# Patient Record
Sex: Male | Born: 1945 | Race: White | Hispanic: No | Marital: Married | Smoking: Former smoker
Health system: Southern US, Community
[De-identification: ages and names within clinical notes are randomized; demographics above are authoritative.]

## PROBLEM LIST (undated history)

## (undated) DIAGNOSIS — I4891 Unspecified atrial fibrillation: Secondary | ICD-10-CM

## (undated) DIAGNOSIS — I10 Essential (primary) hypertension: Secondary | ICD-10-CM

## (undated) DIAGNOSIS — K5720 Diverticulitis of large intestine with perforation and abscess without bleeding: Secondary | ICD-10-CM

## (undated) DIAGNOSIS — K5732 Diverticulitis of large intestine without perforation or abscess without bleeding: Secondary | ICD-10-CM

## (undated) DIAGNOSIS — I2510 Atherosclerotic heart disease of native coronary artery without angina pectoris: Secondary | ICD-10-CM

## (undated) DIAGNOSIS — C44599 Other specified malignant neoplasm of skin of other part of trunk: Secondary | ICD-10-CM

## (undated) DIAGNOSIS — E7800 Pure hypercholesterolemia, unspecified: Secondary | ICD-10-CM

## (undated) DIAGNOSIS — C44509 Unspecified malignant neoplasm of skin of other part of trunk: Secondary | ICD-10-CM

## (undated) DIAGNOSIS — Z972 Presence of dental prosthetic device (complete) (partial): Secondary | ICD-10-CM

## (undated) HISTORY — DX: Pure hypercholesterolemia, unspecified: E78.00

## (undated) HISTORY — DX: Unspecified atrial fibrillation: I48.91

## (undated) HISTORY — DX: Unspecified malignant neoplasm of skin of other part of trunk: C44.509

## (undated) HISTORY — DX: Essential (primary) hypertension: I10

## (undated) HISTORY — DX: Other specified malignant neoplasm of skin of other part of trunk: C44.599

## (undated) HISTORY — DX: Diverticulitis of large intestine without perforation or abscess without bleeding: K57.32

## (undated) HISTORY — PX: KNEE ARTHROSCOPY W/ MENISCAL REPAIR: SHX1877

## (undated) HISTORY — DX: Atherosclerotic heart disease of native coronary artery without angina pectoris: I25.10

## (undated) HISTORY — DX: Diverticulitis of large intestine with perforation and abscess without bleeding: K57.20

## (undated) HISTORY — PX: OTHER SURGICAL HISTORY: SHX169

---

## 1997-09-29 HISTORY — PX: CORONARY ARTERY BYPASS GRAFT: SHX141

## 2010-07-11 ENCOUNTER — Ambulatory Visit: Payer: Self-pay | Admitting: Cardiology

## 2010-10-10 ENCOUNTER — Emergency Department: Payer: Self-pay | Admitting: Internal Medicine

## 2010-10-14 ENCOUNTER — Other Ambulatory Visit: Payer: Self-pay | Admitting: Specialist

## 2010-10-27 ENCOUNTER — Ambulatory Visit: Payer: Self-pay | Admitting: Specialist

## 2010-11-12 ENCOUNTER — Ambulatory Visit: Payer: Self-pay | Admitting: Specialist

## 2010-11-12 DIAGNOSIS — I10 Essential (primary) hypertension: Secondary | ICD-10-CM

## 2010-11-18 ENCOUNTER — Ambulatory Visit: Payer: Self-pay | Admitting: Specialist

## 2011-01-29 ENCOUNTER — Encounter: Payer: Self-pay | Admitting: Cardiology

## 2011-01-29 DIAGNOSIS — I4891 Unspecified atrial fibrillation: Secondary | ICD-10-CM | POA: Insufficient documentation

## 2011-01-29 DIAGNOSIS — E7800 Pure hypercholesterolemia, unspecified: Secondary | ICD-10-CM | POA: Insufficient documentation

## 2011-01-29 DIAGNOSIS — I2510 Atherosclerotic heart disease of native coronary artery without angina pectoris: Secondary | ICD-10-CM | POA: Insufficient documentation

## 2011-01-29 DIAGNOSIS — I10 Essential (primary) hypertension: Secondary | ICD-10-CM | POA: Insufficient documentation

## 2011-02-05 ENCOUNTER — Telehealth: Payer: Self-pay | Admitting: *Deleted

## 2011-02-05 ENCOUNTER — Ambulatory Visit (INDEPENDENT_AMBULATORY_CARE_PROVIDER_SITE_OTHER): Payer: BC Managed Care – PPO | Admitting: Cardiology

## 2011-02-05 ENCOUNTER — Encounter: Payer: Self-pay | Admitting: Cardiology

## 2011-02-05 VITALS — BP 124/88 | HR 54 | Ht 69.0 in | Wt 250.6 lb

## 2011-02-05 DIAGNOSIS — I10 Essential (primary) hypertension: Secondary | ICD-10-CM

## 2011-02-05 DIAGNOSIS — E7800 Pure hypercholesterolemia, unspecified: Secondary | ICD-10-CM

## 2011-02-05 DIAGNOSIS — I2510 Atherosclerotic heart disease of native coronary artery without angina pectoris: Secondary | ICD-10-CM

## 2011-02-05 LAB — HEPATIC FUNCTION PANEL
Albumin: 4 g/dL (ref 3.5–5.2)
Alkaline Phosphatase: 42 U/L (ref 39–117)
Bilirubin, Direct: 0.2 mg/dL (ref 0.0–0.3)
Total Protein: 6.6 g/dL (ref 6.0–8.3)

## 2011-02-05 LAB — BASIC METABOLIC PANEL
CO2: 29 mEq/L (ref 19–32)
Chloride: 107 mEq/L (ref 96–112)
Potassium: 4.4 mEq/L (ref 3.5–5.1)

## 2011-02-05 LAB — LIPID PANEL
LDL Cholesterol: 82 mg/dL (ref 0–99)
Total CHOL/HDL Ratio: 4

## 2011-02-05 NOTE — Assessment & Plan Note (Signed)
He remains asymptomatic. His last stress test in October of 2009 was normal. I recommended continued risk factor modification. We will consider a stress Cardiolite study this fall after he has recovered from his knee surgery.

## 2011-02-05 NOTE — Patient Instructions (Signed)
We will call you with the results of your lab work today.  We will see you for an office visit in 6 months.  Increase your activity and lose weight.

## 2011-02-05 NOTE — Telephone Encounter (Signed)
LM w/ lab results 

## 2011-02-05 NOTE — Assessment & Plan Note (Signed)
He has a low HDL cholesterol. He needs to remain on his Vytorin and he needs to lose a significant amount of weight.

## 2011-02-05 NOTE — Assessment & Plan Note (Signed)
Blood pressure control is adequate. We will continue with his current medical therapy. He needs to restrict his sodium intake.

## 2011-02-05 NOTE — Telephone Encounter (Signed)
Message copied by Murrell Redden on Wed Feb 05, 2011  4:27 PM ------      Message from: [place], [name]      Created: Wed Feb 05, 2011  1:43 PM       Labs look great except low HDL. Needs to lose weight.

## 2011-02-05 NOTE — Progress Notes (Signed)
   Ryan Mccall Date of Birth: 06-29-1946   History of Present Illness: Ryan Mccall is seen for 6 months followup. He states he is doing very well. Since his last visit here he had arthroscopic surgery on his right knee. He denies any chest pain or shortness of breath. He does take Mobic p.r.n. For arthritic pain. He admits he's been very sedentary.  Current Outpatient Prescriptions on File Prior to Visit  Medication Sig Dispense Refill  . aspirin 325 MG EC tablet Take 325 mg by mouth daily.        . COD LIVER OIL PO Take by mouth daily.        Marland Kitchen ezetimibe-simvastatin (VYTORIN) 10-10 MG per tablet Take 1 tablet by mouth every other day.        . fexofenadine (ALLEGRA) 180 MG tablet Take 180 mg by mouth daily.        . fish oil-omega-3 fatty acids 1000 MG capsule Take by mouth daily.        . meloxicam (MOBIC) 7.5 MG tablet Take 7.5 mg by mouth every other day.        . metoprolol (LOPRESSOR) 50 MG tablet Take 50 mg by mouth 2 (two) times daily.        . Multiple Vitamin (MULTIVITAMIN) tablet Take 1 tablet by mouth every other day.        Marland Kitchen DISCONTD: niacin (NIASPAN) 500 MG CR tablet Take 500 mg by mouth at bedtime.          No Known Allergies  Past Medical History  Diagnosis Date  . Coronary artery disease   . Hypertension   . Hypercholesterolemia   . Atrial fibrillation     Past Surgical History  Procedure Date  . Coronary artery bypass graft 1999  . Knee arthroscopy w/ meniscal repair     History  Smoking status  . Former Smoker  . Quit date: 09/29/1997  Smokeless tobacco  . Not on file    History  Alcohol Use No    Family History  Problem Relation Age of Onset  . Lung disease Father 67    Review of Systems: The review of systems is positive for he still walks with a cane. His return to activity has been slow.  All other systems were reviewed and are negative.  Physical Exam: BP 124/88  Pulse 54  Ht 5\' 9"  (1.753 m)  Wt 250 lb 9.6 oz (113.671 kg)  BMI 37.01  kg/m2 He is a morbidly obese white male in no acute distress. He has no JVD or bruits. Lungs are clear. Cardiac exam reveals a regular rate and rhythm without gallop or murmur. Abdomen is soft and nontender. He has no edema. His right knee has healed well. He has mild lower extremity edema. LABORATORY DATA:   Assessment / Plan:

## 2011-03-31 ENCOUNTER — Other Ambulatory Visit: Payer: Self-pay | Admitting: Cardiology

## 2011-03-31 MED ORDER — METOPROLOL TARTRATE 50 MG PO TABS: 50.0000 mg | ORAL_TABLET | Freq: Two times a day (BID) | ORAL | Status: DC

## 2011-03-31 NOTE — Telephone Encounter (Signed)
Med refill

## 2011-04-01 ENCOUNTER — Other Ambulatory Visit: Payer: Self-pay | Admitting: *Deleted

## 2011-04-01 MED ORDER — METOPROLOL TARTRATE 50 MG PO TABS: 50.0000 mg | ORAL_TABLET | Freq: Two times a day (BID) | ORAL | Status: DC

## 2011-04-01 NOTE — Telephone Encounter (Signed)
escribe medication per fax request  

## 2011-09-03 ENCOUNTER — Ambulatory Visit (INDEPENDENT_AMBULATORY_CARE_PROVIDER_SITE_OTHER): Payer: BC Managed Care – PPO | Admitting: Cardiology

## 2011-09-03 ENCOUNTER — Encounter: Payer: Self-pay | Admitting: Cardiology

## 2011-09-03 VITALS — BP 140/90 | HR 54 | Ht 68.0 in | Wt 252.8 lb

## 2011-09-03 DIAGNOSIS — I10 Essential (primary) hypertension: Secondary | ICD-10-CM

## 2011-09-03 DIAGNOSIS — E7800 Pure hypercholesterolemia, unspecified: Secondary | ICD-10-CM

## 2011-09-03 DIAGNOSIS — I2510 Atherosclerotic heart disease of native coronary artery without angina pectoris: Secondary | ICD-10-CM

## 2011-09-03 DIAGNOSIS — E785 Hyperlipidemia, unspecified: Secondary | ICD-10-CM

## 2011-09-03 NOTE — Patient Instructions (Signed)
Continue your current medications.  Work on increasing your aerobic activity and losing weight.  I will see you again in 6 months.

## 2011-09-03 NOTE — Progress Notes (Signed)
   Ryan Mccall Date of Birth: [birth year]   History of Present Illness: Ryan Mccall is seen for 6 months followup. He states he is doing very well. His knee is improving from his prior surgery but he is still somewhat limited. He is working 10-12 hours a day at his job. He denies any chest pain or shortness of breath. He's had no increase in edema or palpitations.  Current Outpatient Prescriptions on File Prior to Visit  Medication Sig Dispense Refill  . aspirin 325 MG EC tablet Take 325 mg by mouth daily.        . COD LIVER OIL PO Take by mouth daily.        Marland Kitchen ezetimibe-simvastatin (VYTORIN) 10-10 MG per tablet Take 1 tablet by mouth every other day.        . fexofenadine (ALLEGRA) 180 MG tablet Take 180 mg by mouth daily.        . fish oil-omega-3 fatty acids 1000 MG capsule Take by mouth daily.        . metoprolol (LOPRESSOR) 50 MG tablet Take 1 tablet (50 mg total) by mouth 2 (two) times daily.  180 tablet  3  . Multiple Vitamin (MULTIVITAMIN) tablet Take 1 tablet by mouth every other day.          Allergies  Allergen Reactions  . Shellfish Allergy     Past Medical History  Diagnosis Date  . Coronary artery disease   . Hypertension   . Hypercholesterolemia   . Atrial fibrillation     Past Surgical History  Procedure Date  . Coronary artery bypass graft 1999  . Knee arthroscopy w/ meniscal repair     History  Smoking status  . Former Smoker  . Quit date: 09/29/1997  Smokeless tobacco  . Not on file    History  Alcohol Use No    Family History  Problem Relation Age of Onset  . Lung disease Father 83    Review of Systems: As noted in history of present illness.  All other systems were reviewed and are negative.  Physical Exam: BP 140/90  Pulse 54  Ht 5\' 8"  (1.727 m)  Wt 114.669 kg (252 lb 12.8 oz)  BMI 38.44 kg/m2 He is a morbidly obese white male in no acute distress. He has no JVD or bruits. Lungs are clear. Cardiac exam reveals a regular rate and rhythm  without gallop or murmur. Abdomen is soft and nontender. He has no edema. His right knee has healed well. He has mild lower extremity edema. LABORATORY DATA: ECG today demonstrates sinus bradycardia with a rate of 49 beats per minute. It is otherwise normal. Blood work from August of 2012 showed a normal CBC. Chemistry panel was normal. Total cholesterol 134, triglycerides 83, HDL 44, LDL 73. Assessment / Plan:

## 2011-09-03 NOTE — Assessment & Plan Note (Signed)
Blood pressure is borderline today. He needs to work on increasing his aerobic activity and lose weight.

## 2011-09-03 NOTE — Assessment & Plan Note (Signed)
Lipid levels are excellent. Continue with his current therapy.

## 2011-09-03 NOTE — Assessment & Plan Note (Signed)
He remains asymptomatic. His last stress test was in October 2009. He is due for followup stress testing but we will await further recovery from his knee surgery. I will followup again in 6 months.

## 2011-11-28 ENCOUNTER — Encounter: Payer: Self-pay | Admitting: Cardiology

## 2012-09-03 ENCOUNTER — Encounter: Payer: Self-pay | Admitting: Nurse Practitioner

## 2012-09-03 ENCOUNTER — Ambulatory Visit (INDEPENDENT_AMBULATORY_CARE_PROVIDER_SITE_OTHER): Payer: BC Managed Care – PPO | Admitting: Nurse Practitioner

## 2012-09-03 VITALS — BP 150/82 | HR 48 | Ht 69.0 in | Wt 247.8 lb

## 2012-09-03 DIAGNOSIS — I259 Chronic ischemic heart disease, unspecified: Secondary | ICD-10-CM

## 2012-09-03 NOTE — Patient Instructions (Addendum)
Continue with your current medicines  We will arrange for repeat stress testing (Lexiscan) in January  Stay active  Monitor your blood pressure at home. Goal is less than 135/85  See Dr. [place] in 6 months  Call the [facility] office at [phone] if you have any questions, problems or concerns.

## 2012-09-03 NOTE — Progress Notes (Signed)
Ryan Mccall Date of Birth: 29-Jun-1946 Medical Record #[id number]  History of Present Illness: Ryan Mccall is seen back today for a one year check. He is seen for Dr. [place]. He has known CAD, HTN and HLD. He had remote CABG per Dr. Laneta Simmers in 1999 with LIMA to LAD, SVG to 1st DX and SVG to intermediate and OM of the LCX. His last stress study was in 2009 which was associated with atrial fib. He was last seen one year ago per Dr. [place].   He comes in today. He is here with his wife. He is doing ok. No chest pain. Not short of breath. 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 Tolerating his medicines. Brought in his labs from August which were ok. BP has been much better as an outpatient. Has been in the 120's at the chiropractor and was there last week. He will have some recurrent palpitations that is very brief in duration.   Current Outpatient Prescriptions on File Prior to Visit  Medication Sig Dispense Refill  . aspirin 325 MG EC tablet Take 325 mg by mouth daily.        . COD LIVER OIL PO Take by mouth daily.        Marland Kitchen ezetimibe (ZETIA) 10 MG tablet Take 10 mg by mouth daily.      . fexofenadine (ALLEGRA) 180 MG tablet Take 180 mg by mouth daily.        . fish oil-omega-3 fatty acids 1000 MG capsule Take by mouth daily.        . metoprolol (LOPRESSOR) 50 MG tablet Take 1 tablet (50 mg total) by mouth 2 (two) times daily.  180 tablet  3  . Multiple Vitamin (MULTIVITAMIN) tablet Take 1 tablet by mouth every other day.        . simvastatin (ZOCOR) 10 MG tablet Take 10 mg by mouth at bedtime.        Allergies  Allergen Reactions  . Shellfish Allergy     Past Medical History  Diagnosis Date  . Coronary artery disease     s/p remote CABG 1999 per Dr. Laneta Simmers  . Hypertension   . Hypercholesterolemia   . Atrial fibrillation     during stress testing in 2009  . Obesity     Past Surgical History  Procedure Date  . Coronary artery  bypass graft 1999  . Knee arthroscopy w/ meniscal repair     History  Smoking status  . Former Smoker  . Quit date: 09/29/1997  Smokeless tobacco  . Not on file    History  Alcohol Use No    Family History  Problem Relation Age of Onset  . Lung disease Father 29    Review of Systems: The review of systems is per the HPI.  All other systems were reviewed and are negative.  Physical Exam: BP 150/82  Pulse 48  Ht 5\' 9"  (1.753 m)  Wt 247 lb 12.8 oz (112.401 kg)  BMI 36.59 kg/m2 His weight is down 5 pounds over this past year. Patient is very pleasant and in no acute distress. Skin is warm and dry. Color is normal.  HEENT is unremarkable. Normocephalic/atraumatic. PERRL. Sclera are nonicteric. Neck is supple. No masses. No JVD. Lungs are clear. Cardiac exam shows a regular rate and rhythm. Abdomen is soft. Extremities are without edema. Gait and ROM are intact. No gross neurologic deficits noted.   LABORATORY DATA:  Reviewed. Glucose 97; TC 141, TG 84, HLD 40 and LDL 84. AST 48 and ALT 51.  Lab Results  Component Value Date   GLUCOSE 97 02/05/2011   CHOL 131 02/05/2011   TRIG 76.0 02/05/2011   HDL 34.10* 02/05/2011   LDLCALC 82 02/05/2011   ALT 37 02/05/2011   AST 36 02/05/2011   NA 141 02/05/2011   K 4.4 02/05/2011   CL 107 02/05/2011   CREATININE 0.8 02/05/2011   BUN 23 02/05/2011   CO2 29 02/05/2011     Assessment / Plan: 1. CAD - remote CABG in 1999 - will arrange for stress testing in early January at his convenience.  2. PAF - reports only very brief (less than a few minutes) palpitations. In sinus on exam today.   3. HTN - blood pressure little higher here today. Has been better in other offices. I have asked him to monitor with a goal BP less than 135/85.   4. HLD - on statin. LDL is 84.   We will arrange for Lexiscan. He is still pretty limited by his knee pain. No cardiac complaints. Will see him back in 6 months.  Patient is agreeable to this plan and will call if any  problems develop in the interim.

## 2012-10-05 ENCOUNTER — Ambulatory Visit (HOSPITAL_COMMUNITY): Payer: Medicare Other | Attending: Cardiology | Admitting: Radiology

## 2012-10-05 VITALS — BP 140/90 | HR 44 | Ht 69.0 in | Wt 249.0 lb

## 2012-10-05 DIAGNOSIS — I259 Chronic ischemic heart disease, unspecified: Secondary | ICD-10-CM

## 2012-10-05 DIAGNOSIS — R0602 Shortness of breath: Secondary | ICD-10-CM

## 2012-10-05 DIAGNOSIS — I25810 Atherosclerosis of coronary artery bypass graft(s) without angina pectoris: Secondary | ICD-10-CM

## 2012-10-05 DIAGNOSIS — I10 Essential (primary) hypertension: Secondary | ICD-10-CM | POA: Insufficient documentation

## 2012-10-05 DIAGNOSIS — I4891 Unspecified atrial fibrillation: Secondary | ICD-10-CM

## 2012-10-05 DIAGNOSIS — I2510 Atherosclerotic heart disease of native coronary artery without angina pectoris: Secondary | ICD-10-CM

## 2012-10-05 DIAGNOSIS — R0989 Other specified symptoms and signs involving the circulatory and respiratory systems: Secondary | ICD-10-CM | POA: Insufficient documentation

## 2012-10-05 DIAGNOSIS — R000 Tachycardia, unspecified: Secondary | ICD-10-CM | POA: Insufficient documentation

## 2012-10-05 DIAGNOSIS — R0609 Other forms of dyspnea: Secondary | ICD-10-CM | POA: Insufficient documentation

## 2012-10-05 MED ORDER — REGADENOSON 0.4 MG/5ML IV SOLN
0.4000 mg | Freq: Once | INTRAVENOUS | Status: AC
  Administered 2012-10-05: 0.4 mg via INTRAVENOUS

## 2012-10-05 MED ORDER — TECHNETIUM TC 99M SESTAMIBI GENERIC - CARDIOLITE
30.0000 | Freq: Once | INTRAVENOUS | Status: AC | PRN
  Administered 2012-10-05: 30 via INTRAVENOUS

## 2012-10-05 MED ORDER — TECHNETIUM TC 99M SESTAMIBI GENERIC - CARDIOLITE
10.0000 | Freq: Once | INTRAVENOUS | Status: AC | PRN
  Administered 2012-10-05: 10 via INTRAVENOUS

## 2012-10-05 NOTE — Progress Notes (Signed)
  [facility] SITE 3 NUCLEAR MED [address] [place], [place] [address] [phone]    Cardiology Nuclear Med Study  Ryan Mccall is a 67 y.o. male     MRN : [id number]     DOB: 18-Nov-1945  Procedure Date: 10/05/2012  Nuclear Med Background Indication for Stress Test:  Evaluation for Ischemia and Graft Patency History:  PAF; '99 CABG; Cardiac Risk Factors: Family History - CAD, History of Smoking, Hypertension, Lipids and Obesity  Symptoms:  DOE and Rapid HR   Nuclear Pre-Procedure Caffeine/Decaff Intake:  None NPO After: 7:30pm   Lungs:  Clear. O2 Sat: 96% on room air. IV 0.9% NS with Angio Cath:  22g  IV Site: R Hand  IV Started by:  Cathlyn Parsons, RN  Chest Size (in):  52 Cup Size: n/a  Height: 5\' 9"  (1.753 m)  Weight:  249 lb (112.946 kg)  BMI:  Body mass index is 36.77 kg/(m^2). Tech Comments:  Metoprolol taken this am    Nuclear Med Study 1 or 2 day study: 1 day  Stress Test Type:  Lexiscan  Reading MD: Willa Rough, MD  Order Authorizing Provider:  [name] [place], MD  Resting Radionuclide: Technetium 46m Sestamibi  Resting Radionuclide Dose: 8.9 mCi   Stress Radionuclide:  Technetium 44m Sestamibi  Stress Radionuclide Dose: 32.8 mCi           Stress Protocol Rest HR: 44 Stress HR: 82  Rest BP: 140/90 Stress BP: 157/86  Exercise Time (min): 2:00 METS: n/a   Predicted Max HR: 155 bpm % Max HR: 52.9 bpm Rate Pressure Product: 21308    Dose of Adenosine (mg):  n/a Dose of Lexiscan: 0.4 mg  Dose of Atropine (mg): n/a Dose of Dobutamine: n/a mcg/kg/min (at max HR)  Stress Test Technologist: Smiley Houseman, CMA-N  Nuclear Technologist:  Domenic Polite, CNMT     Rest Procedure:  Myocardial perfusion imaging was performed at rest 45 minutes following the intravenous administration of Technetium 42m Sestamibi.  Rest ECG: Normal sinus rhythm  Stress Procedure:  The patient received IV Lexiscan 0.4 mg over 15-seconds.  Technetium 30m Sestamibi  injected at 30-seconds.  He did c/o chest tightness with Lexiscan.  Quantitative spect images were obtained after a 45 minute delay.  Stress ECG: No significant ST segment change suggestive of ischemia.  QPS Raw Data Images:  Normal; no motion artifact; normal heart/lung ratio. Stress Images:  Normal homogeneous uptake in all areas of the myocardium. Rest Images:  Normal homogeneous uptake in all areas of the myocardium. Subtraction (SDS):  No evidence of ischemia. Transient Ischemic Dilatation (Normal <1.22):  1.11 Lung/Heart Ratio (Normal <0.45):  0.45  Quantitative Gated Spect Images QGS EDV:  150 ml QGS ESV:  18 ml  Impression Exercise Capacity:  Lexiscan with low level exercise. BP Response:  Normal blood pressure response. Clinical Symptoms:  chest tightness ECG Impression:  No significant ST segment change suggestive of ischemia. Comparison with Prior Nuclear Study: No images to compare  Overall Impression:  There is no definite scar or ischemia. There is septal dyssynergy consistent with the prior history of CABG. This is a low risk scan.  LV Ejection Fraction: 46%.  LV Wall Motion:    Septal dyssynergy consistent with a prior history of CABG.  Willa Rough, MD

## 2012-10-08 ENCOUNTER — Telehealth: Payer: Self-pay | Admitting: Cardiology

## 2012-10-08 NOTE — Telephone Encounter (Signed)
NA

## 2012-10-08 NOTE — Telephone Encounter (Signed)
Results given per Dr [place].

## 2012-10-08 NOTE — Telephone Encounter (Signed)
rtn call from yesterday to get husbands results she will be leaving at 11am and you can leave a message if no answer

## 2013-04-08 ENCOUNTER — Ambulatory Visit (INDEPENDENT_AMBULATORY_CARE_PROVIDER_SITE_OTHER): Payer: Medicare Other | Admitting: Cardiology

## 2013-04-08 ENCOUNTER — Other Ambulatory Visit: Payer: Self-pay

## 2013-04-08 ENCOUNTER — Encounter: Payer: Self-pay | Admitting: Cardiology

## 2013-04-08 VITALS — BP 130/84 | HR 68 | Ht 69.0 in | Wt 246.8 lb

## 2013-04-08 DIAGNOSIS — I2510 Atherosclerotic heart disease of native coronary artery without angina pectoris: Secondary | ICD-10-CM

## 2013-04-08 DIAGNOSIS — I4891 Unspecified atrial fibrillation: Secondary | ICD-10-CM

## 2013-04-08 DIAGNOSIS — I10 Essential (primary) hypertension: Secondary | ICD-10-CM

## 2013-04-08 DIAGNOSIS — E7800 Pure hypercholesterolemia, unspecified: Secondary | ICD-10-CM

## 2013-04-08 LAB — LIPID PANEL
Total CHOL/HDL Ratio: 3
VLDL: 14.8 mg/dL (ref 0.0–40.0)

## 2013-04-08 LAB — CBC WITH DIFFERENTIAL/PLATELET
Basophils Relative: 0.5 % (ref 0.0–3.0)
Eosinophils Absolute: 0.2 10*3/uL (ref 0.0–0.7)
HCT: 45.6 % (ref 39.0–52.0)
Lymphs Abs: 1.5 10*3/uL (ref 0.7–4.0)
MCHC: 33.7 g/dL (ref 30.0–36.0)
MCV: 90.1 fl (ref 78.0–100.0)
Monocytes Absolute: 0.4 10*3/uL (ref 0.1–1.0)
Neutro Abs: 2.1 10*3/uL (ref 1.4–7.7)
Neutrophils Relative %: 50.5 % (ref 43.0–77.0)
RBC: 5.06 Mil/uL (ref 4.22–5.81)

## 2013-04-08 LAB — HEPATIC FUNCTION PANEL
ALT: 42 U/L (ref 0–53)
Bilirubin, Direct: 0.2 mg/dL (ref 0.0–0.3)
Total Bilirubin: 1 mg/dL (ref 0.3–1.2)

## 2013-04-08 LAB — BASIC METABOLIC PANEL
BUN: 22 mg/dL (ref 6–23)
Calcium: 8.9 mg/dL (ref 8.4–10.5)
GFR: 97.16 mL/min (ref >60.00)
Glucose, Bld: 93 mg/dL (ref 70–99)
Potassium: 4.3 mEq/L (ref 3.5–5.1)

## 2013-04-08 MED ORDER — RIVAROXABAN 20 MG PO TABS: 20.0000 mg | ORAL_TABLET | Freq: Every day | ORAL | Status: DC

## 2013-04-08 NOTE — Addendum Note (Signed)
Addended by: Awilda Bill on: 04/08/2013 09:18 AM   Modules accepted: Orders

## 2013-04-08 NOTE — Progress Notes (Signed)
Ryan Mccall Date of Birth: December 30, 1945 Medical Record #[id number]  History of Present Illness: Mr. Burgoon is seen back today for a followup visit. He has known CAD, HTN and HLD. He had remote CABG per Dr. Laneta Simmers in 1999 with LIMA to LAD, SVG to 1st DX and SVG to intermediate and OM of the LCX. He did have transient atrial fibrillation during a stress test in 2009. His last stress Myoview in January 2014 showed normal perfusion with an ejection fraction of 46%. On followup today he is reports that he is doing very well. He denies any symptoms of chest pain, shortness of breath, or palpitations. He's had no dizziness or syncope. His energy level is good. He is maintaining his exercise. Blood pressure readings at home have been normal. 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 He has no history of TIA or stroke. He denies any history of bleeding problems. He does take Aleve intermittently for arthritic pain.  Current Outpatient Prescriptions on File Prior to Visit  Medication Sig Dispense Refill  . COD LIVER OIL PO Take by mouth daily.        Marland Kitchen ezetimibe (ZETIA) 10 MG tablet Take 10 mg by mouth daily.      . fexofenadine (ALLEGRA) 180 MG tablet Take 180 mg by mouth daily.        . fish oil-omega-3 fatty acids 1000 MG capsule Take by mouth daily.        . metoprolol (LOPRESSOR) 50 MG tablet Take 1 tablet (50 mg total) by mouth 2 (two) times daily.  180 tablet  3  . Multiple Vitamin (MULTIVITAMIN) tablet Take 1 tablet by mouth every other day.        . simvastatin (ZOCOR) 10 MG tablet Take 10 mg by mouth at bedtime.       No current facility-administered medications on file prior to visit.    Allergies  Allergen Reactions  . Shellfish Allergy     Past Medical History  Diagnosis Date  . Coronary artery disease     s/p remote CABG 1999 per Dr. Laneta Simmers  . Hypertension   . Hypercholesterolemia   . Atrial fibrillation   . 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 Coronary artery bypass graft  1999  . Knee arthroscopy w/ meniscal repair    . Excision of skin cancer      History  Smoking status  . Former Smoker  . Quit date: 09/29/1997  Smokeless tobacco  . Not on file    History  Alcohol Use No    Family History  Problem Relation Age of Onset  . Lung disease Father 90    Review of Systems: The review of systems is per the HPI.  All other systems were reviewed and are negative.  Physical Exam: BP 130/84  Pulse 68  Ht 5\' 9"  (1.753 m)  Wt 246 lb 12.8 oz (111.948 kg)  BMI 36.43 kg/m2 His weight is down 1 pound. Patient is very pleasant and in no acute distress. Skin is warm and dry. Color is normal.  HEENT is unremarkable. Normocephalic/atraumatic. PERRL. Sclera are nonicteric. Neck is supple. No masses. No JVD. Lungs are clear. Cardiac exam shows an irregular rate and rhythm. Abdomen is soft. Extremities are without edema. Gait and ROM are intact. No gross neurologic deficits noted.   LABORATORY DATA:   Lab Results  Component Value Date   GLUCOSE 97 02/05/2011   CHOL 131 02/05/2011   TRIG 76.0 02/05/2011   HDL 34.10* 02/05/2011   LDLCALC 82 02/05/2011   ALT 37 02/05/2011   AST 36 02/05/2011   NA 141 02/05/2011   K 4.4 02/05/2011   CL 107 02/05/2011   CREATININE 0.8 02/05/2011   BUN 23 02/05/2011   CO2 29 02/05/2011    ECG today demonstrates atrial fibrillation with a rate of 68 beats per minute. It is otherwise normal.  Assessment / Plan: 1. CAD - remote CABG in 1999 - normal Myoview in January 2014. Continue risk factor modification.  2. Atrial fibrillation - rate control is excellent on metoprolol. Patient is asymptomatic. I recommended continued rate control. We will check complete labs today including CBC, chemistries, and TSH. We'll schedule him for an echocardiogram. I recommended anticoagulation and we discussed the options of Coumadin versus one of  the novel agents. We have elected to start him on Xarelto 20 mg daily. I recommended he stop taking aspirin and avoid the use of nonsteroidal anti-inflammatory drugs. I'll followup again in 3 months.  3. HTN - appears to be fairly well controlled.  4. HLD - on statin. Last LDL was 79.

## 2013-04-08 NOTE — Patient Instructions (Signed)
Start Xarelto 20 mg daily with your evening meal.  Stop ASA and avoid NSAIDs like Aleve.  We will check lab work today and schedule you for an Echocardiogram.  I will see you in 3 months.

## 2013-04-12 ENCOUNTER — Ambulatory Visit (HOSPITAL_COMMUNITY): Payer: Medicare Other | Attending: Cardiovascular Disease | Admitting: Radiology

## 2013-04-12 DIAGNOSIS — I059 Rheumatic mitral valve disease, unspecified: Secondary | ICD-10-CM | POA: Insufficient documentation

## 2013-04-12 DIAGNOSIS — I079 Rheumatic tricuspid valve disease, unspecified: Secondary | ICD-10-CM | POA: Insufficient documentation

## 2013-04-12 DIAGNOSIS — E7800 Pure hypercholesterolemia, unspecified: Secondary | ICD-10-CM

## 2013-04-12 DIAGNOSIS — I4891 Unspecified atrial fibrillation: Secondary | ICD-10-CM | POA: Insufficient documentation

## 2013-04-12 DIAGNOSIS — I2510 Atherosclerotic heart disease of native coronary artery without angina pectoris: Secondary | ICD-10-CM | POA: Insufficient documentation

## 2013-04-12 DIAGNOSIS — I10 Essential (primary) hypertension: Secondary | ICD-10-CM | POA: Insufficient documentation

## 2013-04-12 DIAGNOSIS — E669 Obesity, unspecified: Secondary | ICD-10-CM | POA: Insufficient documentation

## 2013-04-12 DIAGNOSIS — E785 Hyperlipidemia, unspecified: Secondary | ICD-10-CM | POA: Insufficient documentation

## 2013-04-12 NOTE — Progress Notes (Signed)
Echocardiogram performed.  

## 2013-04-17 NOTE — Progress Notes (Signed)
Quick Note:  Echo looks good. Normal LV function. Mild MR. LA is enlarged and this may contribute to his Afib.  Joelyn Lover [place] MD, [facility]   ______

## 2013-04-20 ENCOUNTER — Telehealth: Payer: Self-pay | Admitting: Cardiology

## 2013-04-20 NOTE — Telephone Encounter (Signed)
Patient called echo results given. 

## 2013-04-20 NOTE — Telephone Encounter (Signed)
New Prob  Pt would like to speak with you, did not say what it was about.

## 2013-05-11 ENCOUNTER — Telehealth: Payer: Self-pay

## 2013-05-11 MED ORDER — RIVAROXABAN 20 MG PO TABS: 20.0000 mg | ORAL_TABLET | Freq: Every day | ORAL | Status: DC

## 2013-05-11 NOTE — Telephone Encounter (Signed)
Called and Merrill stated that Xarelto from [facility] is too expensive and that [facility] is cheaper and asked if refill could be sent into [facility]. I sent in refills to [facility].

## 2013-07-15 ENCOUNTER — Encounter (INDEPENDENT_AMBULATORY_CARE_PROVIDER_SITE_OTHER): Payer: Self-pay

## 2013-07-15 ENCOUNTER — Ambulatory Visit (INDEPENDENT_AMBULATORY_CARE_PROVIDER_SITE_OTHER): Payer: Medicare Other | Admitting: Cardiology

## 2013-07-15 ENCOUNTER — Encounter: Payer: Self-pay | Admitting: Cardiology

## 2013-07-15 VITALS — BP 128/88 | HR 64 | Ht 69.0 in | Wt 244.0 lb

## 2013-07-15 DIAGNOSIS — I2510 Atherosclerotic heart disease of native coronary artery without angina pectoris: Secondary | ICD-10-CM

## 2013-07-15 DIAGNOSIS — E7800 Pure hypercholesterolemia, unspecified: Secondary | ICD-10-CM

## 2013-07-15 DIAGNOSIS — I4891 Unspecified atrial fibrillation: Secondary | ICD-10-CM

## 2013-07-15 DIAGNOSIS — I10 Essential (primary) hypertension: Secondary | ICD-10-CM

## 2013-07-15 DIAGNOSIS — Z0000 Encounter for general adult medical examination without abnormal findings: Secondary | ICD-10-CM

## 2013-07-15 NOTE — Patient Instructions (Signed)
Continue your current therapy  I will see you in 6 months with blood work

## 2013-07-15 NOTE — Progress Notes (Signed)
Ryan Mccall Date of Birth: 09/25/1946 Medical Record #[id number]  History of Present Illness: Ryan Mccall is seen back today for a followup visit. He has known CAD, HTN and HLD. He had remote CABG per Dr. Laneta Simmers in 1999 with LIMA to LAD, SVG to 1st DX and SVG to intermediate and OM of the LCX. He did have transient atrial fibrillation during a stress test in 2009. His last stress Myoview in January 2014 showed normal perfusion with an ejection fraction of 46%. When seen in July he was found to be in atrial fibrillation. His heart rate was well-controlled. Complete laboratory data at that time was unremarkable. Echocardiogram showed normal LV function with mild mitral insufficiency and moderate left atrial enlargement. He has been managed with rate control and anticoagulation with Xarelto. He remains asymptomatic.  Current Outpatient Prescriptions on File Prior to Visit  Medication Sig Dispense Refill  . COD LIVER OIL PO Take by mouth daily.        Marland Kitchen ezetimibe (ZETIA) 10 MG tablet Take 10 mg by mouth daily.      . fexofenadine (ALLEGRA) 180 MG tablet Take 180 mg by mouth daily.        . fish oil-omega-3 fatty acids 1000 MG capsule Take by mouth daily.        . metoprolol (LOPRESSOR) 50 MG tablet Take 1 tablet (50 mg total) by mouth 2 (two) times daily.  180 tablet  3  . Multiple Vitamin (MULTIVITAMIN) tablet Take 1 tablet by mouth every other day.        . Rivaroxaban (XARELTO) 20 MG TABS tablet Take 1 tablet (20 mg total) by mouth daily.  90 tablet  2  . simvastatin (ZOCOR) 10 MG tablet Take 10 mg by mouth at bedtime.       No current facility-administered medications on file prior to visit.    Allergies  Allergen Reactions  . Shellfish Allergy     Past Medical History  Diagnosis Date  . Coronary artery disease     s/p remote CABG 1999 per Dr. Laneta Simmers  . Hypertension   . Hypercholesterolemia   . Atrial fibrillation   . 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 Coronary artery bypass graft  1999  . Knee arthroscopy w/ meniscal repair    . Excision of skin cancer      History  Smoking status  . Former Smoker  . Quit date: 09/29/1997  Smokeless tobacco  . Not on file    History  Alcohol Use No    Family History  Problem Relation Age of Onset  . Lung disease Father 68    Review of Systems: The review of systems is per the HPI.  All other systems were reviewed and are negative.  Physical Exam: BP 128/88  Pulse 64  Ht 5\' 9"  (1.753 m)  Wt 244 lb (110.678 kg)  BMI 36.02 kg/m2 His weight is down 2 pounds. Patient is very pleasant and in no acute distress. Skin is warm and dry. Color is normal.  HEENT is unremarkable. Normocephalic/atraumatic. PERRL. Sclera are nonicteric. Neck is supple. No masses. No JVD. Lungs are clear. Cardiac exam shows an irregular rate and rhythm. Abdomen is soft. Extremities are without edema. Gait and ROM are intact. No gross neurologic deficits noted.   LABORATORY DATA:   Lab Results  Component Value Date   WBC 4.2* 04/08/2013  HGB 15.4 04/08/2013   HCT 45.6 04/08/2013   PLT 150.0 04/08/2013   GLUCOSE 93 04/08/2013   CHOL 113 04/08/2013   TRIG 74.0 04/08/2013   HDL 38.00* 04/08/2013   LDLCALC 60 04/08/2013   ALT 42 04/08/2013   AST 42* 04/08/2013   NA 139 04/08/2013   K 4.3 04/08/2013   CL 108 04/08/2013   CREATININE 0.8 04/08/2013   BUN 22 04/08/2013   CO2 26 04/08/2013   TSH 3.48 04/08/2013     Assessment / Plan: 1. CAD - remote CABG in 1999 - normal Myoview in January 2014. Continue risk factor modification.  2. Atrial fibrillation - rate control is excellent on metoprolol. Patient is asymptomatic. Continue anticoagulation.  3. HTN -  well controlled.  4. HLD - on statin. Last LDL was 79.  Plan I will followup again in 6 months and check fasting lab work at that time.

## 2014-01-04 ENCOUNTER — Encounter: Payer: Self-pay | Admitting: Cardiology

## 2014-01-23 ENCOUNTER — Other Ambulatory Visit: Payer: Medicare Other

## 2014-01-23 ENCOUNTER — Ambulatory Visit (INDEPENDENT_AMBULATORY_CARE_PROVIDER_SITE_OTHER): Payer: Medicare Other | Admitting: Physician Assistant

## 2014-01-23 ENCOUNTER — Encounter: Payer: Self-pay | Admitting: Physician Assistant

## 2014-01-23 VITALS — BP 122/100 | HR 74 | Ht 69.0 in | Wt 251.0 lb

## 2014-01-23 DIAGNOSIS — R0609 Other forms of dyspnea: Secondary | ICD-10-CM

## 2014-01-23 DIAGNOSIS — I2510 Atherosclerotic heart disease of native coronary artery without angina pectoris: Secondary | ICD-10-CM

## 2014-01-23 DIAGNOSIS — R0989 Other specified symptoms and signs involving the circulatory and respiratory systems: Secondary | ICD-10-CM

## 2014-01-23 DIAGNOSIS — R110 Nausea: Secondary | ICD-10-CM

## 2014-01-23 DIAGNOSIS — I10 Essential (primary) hypertension: Secondary | ICD-10-CM

## 2014-01-23 DIAGNOSIS — Z7901 Long term (current) use of anticoagulants: Secondary | ICD-10-CM

## 2014-01-23 DIAGNOSIS — R0683 Snoring: Secondary | ICD-10-CM

## 2014-01-23 DIAGNOSIS — I4891 Unspecified atrial fibrillation: Secondary | ICD-10-CM

## 2014-01-23 DIAGNOSIS — E7800 Pure hypercholesterolemia, unspecified: Secondary | ICD-10-CM

## 2014-01-23 LAB — BASIC METABOLIC PANEL
BUN: 16 mg/dL (ref 6–23)
CO2: 27 meq/L (ref 19–32)
Calcium: 9.5 mg/dL (ref 8.4–10.5)
Chloride: 105 mEq/L (ref 96–112)
Creatinine, Ser: 0.8 mg/dL (ref 0.4–1.5)
GFR: 98.27 mL/min (ref >60.00)
Glucose, Bld: 95 mg/dL (ref 70–99)
Potassium: 4.6 mEq/L (ref 3.5–5.1)
Sodium: 139 mEq/L (ref 135–145)

## 2014-01-23 LAB — CBC WITH DIFFERENTIAL/PLATELET
BASOS ABS: 0 10*3/uL (ref 0.0–0.1)
Basophils Relative: 0.6 % (ref 0.0–3.0)
Eosinophils Absolute: 0.1 10*3/uL (ref 0.0–0.7)
Eosinophils Relative: 2.6 % (ref 0.0–5.0)
HEMATOCRIT: 46.1 % (ref 39.0–52.0)
HEMOGLOBIN: 15.6 g/dL (ref 13.0–17.0)
LYMPHS ABS: 1.4 10*3/uL (ref 0.7–4.0)
Lymphocytes Relative: 26.4 % (ref 12.0–46.0)
MCHC: 33.9 g/dL (ref 30.0–36.0)
MCV: 89.8 fl (ref 78.0–100.0)
MONOS PCT: 7.7 % (ref 3.0–12.0)
Monocytes Absolute: 0.4 10*3/uL (ref 0.1–1.0)
NEUTROS ABS: 3.4 10*3/uL (ref 1.4–7.7)
Neutrophils Relative %: 62.7 % (ref 43.0–77.0)
Platelets: 179 10*3/uL (ref 150.0–400.0)
RBC: 5.14 Mil/uL (ref 4.22–5.81)
RDW: 14.2 % (ref 11.5–14.6)
WBC: 5.5 10*3/uL (ref 4.5–10.5)

## 2014-01-23 LAB — LIPID PANEL
Cholesterol: 125 mg/dL (ref 0–200)
HDL: 37.1 mg/dL — AB (ref >39.00)
LDL Cholesterol: 74 mg/dL (ref 0–99)
Total CHOL/HDL Ratio: 3
Triglycerides: 71 mg/dL (ref 0.0–149.0)
VLDL: 14.2 mg/dL (ref 0.0–40.0)

## 2014-01-23 LAB — HEPATIC FUNCTION PANEL
ALT: 47 U/L (ref 0–53)
AST: 47 U/L — ABNORMAL HIGH (ref 0–37)
Albumin: 4.3 g/dL (ref 3.5–5.2)
Alkaline Phosphatase: 46 U/L (ref 39–117)
BILIRUBIN DIRECT: 0.1 mg/dL (ref 0.0–0.3)
Total Bilirubin: 0.8 mg/dL (ref 0.3–1.2)
Total Protein: 7.3 g/dL (ref 6.0–8.3)

## 2014-01-23 MED ORDER — FAMOTIDINE 20 MG PO TABS: 20.0000 mg | ORAL_TABLET | Freq: Two times a day (BID) | ORAL | Status: DC

## 2014-01-23 MED ORDER — METOPROLOL TARTRATE 50 MG PO TABS: 75.0000 mg | ORAL_TABLET | Freq: Two times a day (BID) | ORAL | Status: DC

## 2014-01-23 NOTE — Patient Instructions (Addendum)
INCREASE METOPROLOL TO 75 MG TWICE DAILY( THIS WILL BE 1 AND 1/2 TABS TWICE DAILY)  GET OTC PEPCID 20 MG 1 TABLET TWICE DAILY FOR THE NEXT 2-4 WEEKS  FOLLOW UP WITH YOUR PRIMARY CARE PHYSICIAN IF YOUR NAUSEA DOES NOT GET ANY BETTER  PLEASE FOLLOW UP WITH DR. [name] IN ABOUT 4-6 WEEKS  Your physician has requested that you have a lexiscan myoview. For further information please visit [email or web address]. Please follow instruction sheet, as given.  Your physician recommends that you return for lab work in: TODAY, BMET, CBC W/DIFF, [place] has recommended that you have a sleep study TO BE DONE AT [place]. This test records several body functions during sleep, including: brain activity, eye movement, oxygen and carbon dioxide blood levels, heart rate and rhythm, breathing rate and rhythm, the flow of air through your mouth and nose, snoring, body muscle movements, and chest and belly movement.

## 2014-01-23 NOTE — Progress Notes (Signed)
[facility], [address] [place], [place]  [address] Phone: [phone] Fax:  [phone]  Date:  01/23/2014   ID:  Ryan Mccall, DOB 1945-11-07, MRN [id number]  PCP:  Lillard Anes, MD  Cardiologist:  Dr. [name]     History of Present Illness: Ryan Mccall is a 68 y.o. male with a hx of CAD s/p CABG in 1999 (L-LAD, S-D1, S-RI+OM), AFib on Xarelto, HTN, HL.  Last seen by Dr. [name] in 06/2013.  He returns for follow up.  He has occasional chest tightness with overexertion. He has noted this over the last 2 months without significant changes. He does note associated dyspnea. He denies significant dyspnea on exertion and describes NYHA class II symptoms. He denies orthopnea or PND. He has occasional LE edema that is worse at the end of the day. He denies syncope.   Studies:  - Echo (04/12/13):  EF 55%, no RWMA, NL diast fxn, trivial AI, mild MR, mod LAE, PASP 34 mmHg.  - Nuclear (10/05/12):  No scar or ischemia, EF 46%; Low Risk   Recent Labs: 04/08/2013: ALT 42; Creatinine 0.8; HDL Cholesterol 38.00*; Hemoglobin 15.4; LDL (calc) 60; Potassium 4.3; TSH 3.48   Wt Readings from Last 3 Encounters:  01/23/14 251 lb (113.853 kg)  07/15/13 244 lb (110.678 kg)  04/08/13 246 lb 12.8 oz (111.948 kg)     Past Medical History  Diagnosis Date  . Coronary artery disease     s/p remote CABG 1999 per Dr. Cyndia Bent  . Hypertension   . Hypercholesterolemia   . Atrial fibrillation   . 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 COD LIVER OIL PO Take by mouth daily.        Marland Kitchen ezetimibe (ZETIA) 10 MG tablet Take 10 mg by mouth daily.      . fexofenadine (ALLEGRA) 180 MG tablet Take 180 mg by mouth daily.        . fish oil-omega-3 fatty acids 1000 MG capsule Take by mouth daily.        . metoprolol (LOPRESSOR) 50 MG tablet Take 1 tablet (50 mg total) by mouth 2 (two) times daily.  180 tablet  3  .  Multiple Vitamin (MULTIVITAMIN) tablet Take 1 tablet by mouth every other day.        . Rivaroxaban (XARELTO) 20 MG TABS tablet Take 1 tablet (20 mg total) by mouth daily.  90 tablet  2  . simvastatin (ZOCOR) 10 MG tablet Take 10 mg by mouth at bedtime.       No current facility-administered medications for this visit.    Allergies:   Shellfish allergy   Social History:  The patient  reports that he quit smoking about 16 years ago. He does not have any smokeless tobacco history on file. He reports that he does not drink alcohol or use illicit drugs.   Family History:  The patient's family history includes Lung disease (age of onset: 10) in his father.   ROS:  Please see the history of present illness.   He occasionally gets nauseated with meals. He denies vomiting. He denies melena or hematochezia. He has chronic diarrhea alternating with constipation. He denies hematemesis, weight loss, fever.   His wife notes a history of snoring as well as witnessed apnea.  All other systems reviewed and negative.   PHYSICAL EXAM: VS:  BP 122/100  Pulse 74  Ht 5\' 9"  (1.753 m)  Wt 251 lb (113.853 kg)  BMI 37.05 kg/m2 Well nourished, well developed, in no acute distress HEENT: normal Neck: no JVD Cardiac:  normal S1, S2; irregularly irregular rhythm; no murmur Lungs:  clear to auscultation bilaterally, no wheezing, rhonchi or rales Abd: soft, nontender, no hepatomegaly Ext: trace bilateral LE edema Skin: warm and dry Neuro:  CNs 2-12 intact, no focal abnormalities noted  EKG:  Atrial fibrillation, HR 74, normal axis, nonspecific ST-T wave changes, no change from prior tracing  ASSESSMENT AND PLAN:  1. Coronary artery disease: Patient has noted exertional chest tightness over the last couple of months. He essentially is describing CCS class II angina. Symptoms are overall stable without significant change. It has been over year since his last stress test. I will arrange a Lexiscan Myoview for risk  stratification. Question if he is developing chest discomfort with elevated heart rates. I will adjust his metoprolol to 75 mg twice a day. Continue statin. He is not on aspirin as he is on Xarelto. 2. Atrial fibrillation: Rate is controlled. Continue Xarelto. Check followup basic metabolic panel and CBC today. Adjust metoprolol as noted. 3. Hypertension: Fair control. Diastolic readings have been elevated. Adjust metoprolol as noted. 4. Hypercholesterolemia: Continue statin. Check followup lipids and LFTs today. 5. Snoring: Arrange sleep study. 6. Nausea: I have asked him to start taking Pepcid 20 mg twice a day. He may obtain this over-the-counter. If his symptoms do not improve, I have asked him to follow up with primary care. 7. Disposition: Follow up with Dr. [name] in 4-6 weeks.  Signed, Richardson Dopp, PA-C  01/23/2014 8:48 AM

## 2014-01-24 ENCOUNTER — Telehealth: Payer: Self-pay | Admitting: *Deleted

## 2014-01-24 NOTE — Telephone Encounter (Signed)
lmom labs ok, continue on current treatment plan 

## 2014-01-24 NOTE — Telephone Encounter (Signed)
lmptcb for lab results 

## 2014-01-26 ENCOUNTER — Ambulatory Visit: Payer: Medicare Other | Admitting: Cardiology

## 2014-01-26 ENCOUNTER — Other Ambulatory Visit: Payer: Medicare Other

## 2014-01-31 ENCOUNTER — Other Ambulatory Visit: Payer: Self-pay

## 2014-01-31 DIAGNOSIS — I10 Essential (primary) hypertension: Secondary | ICD-10-CM

## 2014-01-31 MED ORDER — METOPROLOL TARTRATE 50 MG PO TABS: 75.0000 mg | ORAL_TABLET | Freq: Two times a day (BID) | ORAL | Status: DC

## 2014-02-01 ENCOUNTER — Telehealth (HOSPITAL_COMMUNITY): Payer: Self-pay

## 2014-02-02 ENCOUNTER — Telehealth: Payer: Self-pay | Admitting: *Deleted

## 2014-02-02 NOTE — Telephone Encounter (Signed)
Rhea Bleacher D called stating she had Mr. Bergen family there and there is some question about metoprolol sig stating TID per [facility]. I reviewed chart, advised Pharm D that no sig states 1.5 tabs BID # 270 x 3 sent to [facility] Faxed copy rx

## 2014-02-03 ENCOUNTER — Ambulatory Visit (HOSPITAL_COMMUNITY)
Admission: RE | Admit: 2014-02-03 | Discharge: 2014-02-03 | Disposition: A | Discharge status: Home / Self Care | Payer: Medicare Other | Source: Ambulatory Visit | Attending: Internal Medicine | Admitting: Internal Medicine

## 2014-02-03 DIAGNOSIS — I2510 Atherosclerotic heart disease of native coronary artery without angina pectoris: Secondary | ICD-10-CM | POA: Insufficient documentation

## 2014-02-03 DIAGNOSIS — I4891 Unspecified atrial fibrillation: Secondary | ICD-10-CM

## 2014-02-03 MED ORDER — REGADENOSON 0.4 MG/5ML IV SOLN: 0.4000 mg | Freq: Once | INTRAVENOUS | Status: DC

## 2014-02-03 MED ORDER — TECHNETIUM TC 99M SESTAMIBI GENERIC - CARDIOLITE: 30.1000 | Freq: Once | INTRAVENOUS | Status: AC | PRN

## 2014-02-03 MED ORDER — TECHNETIUM TC 99M SESTAMIBI GENERIC - CARDIOLITE: 10.1000 | Freq: Once | INTRAVENOUS | Status: AC | PRN

## 2014-02-03 NOTE — Procedures (Addendum)
[facility] [address] [place] [address] [phone]  Cardiology Nuclear Med Study  Ryan Mccall is a 68 y.o. male     MRN : [id number]     DOB: 1945-11-30  Procedure Date: 02/03/2014  Nuclear Med Background Indication for Stress Test:  Evaluation for Ischemia, Graft Patency and Abnormal EKG History:  CAD;CABG X4-1999;PAF;Last NUC MPI on 10/05/2012-nonischemic;EF=46%;ECHO 04/12/2013;EF=55% Cardiac Risk Factors: Family History - CAD, History of Smoking, Hypertension, Lipids and Obesity  Symptoms:  Chest Pain, DOE, Nausea, Palpitations and SOB   Nuclear Pre-Procedure Caffeine/Decaff Intake:  7:00pm NPO After: 5:00am   IV Site: R Hand  IV 0.9% NS with Angio Cath:  22g  Chest Size (in):  48"  IV Started by: Rolene Course, RN  Height: 5\' 9"  (1.753 m)  Cup Size: n/a  BMI:  Body mass index is 37.05 kg/(m^2). Weight:  251 lb (113.853 kg)   Tech Comments:  n/a    Nuclear Med Study 1 or 2 day study: 1 day  Stress Test Type:  [place] Provider:  [name], MD   Resting Radionuclide: Technetium 26m Sestamibi  Resting Radionuclide Dose: 10.1 mCi   Stress Radionuclide:  Technetium 69m Sestamibi  Stress Radionuclide Dose: 30.1 mCi           Stress Protocol Rest HR: 70 Stress HR: 77  Rest BP: 135/94 Stress BP: 138/98  Exercise Time (min): n/a METS: n/a   Predicted Max HR: 154 bpm % Max HR: 51.95 bpm Rate Pressure Product: 11040  Dose of Adenosine (mg):  n/a Dose of Lexiscan: 0.4 mg  Dose of Atropine (mg): n/a Dose of Dobutamine: n/a mcg/kg/min (at max HR)  Stress Test Technologist: Leane Para, CCT Nuclear Technologist: Imagene Riches, CNMT   Rest Procedure:  Myocardial perfusion imaging was performed at rest 45 minutes following the intravenous administration of Technetium 72m Sestamibi. Stress Procedure:  The patient received IV Lexiscan 0.4 mg over 15-seconds.  Technetium 95m Sestamibi  injected at 30-seconds.  There were no significant changes with Lexiscan.  Quantitative spect images were obtained after a 45 minute delay.  Transient Ischemic Dilatation (Normal <1.22):  0.94 Lung/Heart Ratio (Normal <0.45):  0.36 QGS EDV:  n/l ml QGS ESV:  n/l ml LV Ejection Fraction: Study not gated     Rest ECG: Atrial Fibrilliation  Stress ECG: No significant change from baseline ECG  QPS Raw Data Images:  Normal; no motion artifact; normal heart/lung ratio. Stress Images:  Normal homogeneous uptake in all areas of the myocardium. Rest Images:  Normal homogeneous uptake in all areas of the myocardium. Subtraction (SDS):  Normal  Impression Exercise Capacity:  Lexiscan with no exercise. BP Response:  Normal BP response Clinical Symptoms:  Mild chest pain/dyspnea. ECG Impression:  No significant ST segment change suggestive of ischemia. Comparison with Prior Nuclear Study: No significant change from previous study  Overall Impression:  Normal stress nuclear study.  LV Wall Motion:  Not gated secondary to AF.   Troy Sine, MD  02/03/2014 12:10 PM

## 2014-02-06 ENCOUNTER — Telehealth: Payer: Self-pay | Admitting: *Deleted

## 2014-02-06 ENCOUNTER — Encounter: Payer: Self-pay | Admitting: Physician Assistant

## 2014-02-06 NOTE — Telephone Encounter (Signed)
lmptcb for myoview results 

## 2014-02-06 NOTE — Telephone Encounter (Signed)
pt notified normal myoview with verbal understanding

## 2014-03-06 ENCOUNTER — Encounter: Payer: Self-pay | Admitting: Cardiology

## 2014-03-06 ENCOUNTER — Ambulatory Visit (INDEPENDENT_AMBULATORY_CARE_PROVIDER_SITE_OTHER): Payer: Medicare Other | Admitting: Cardiology

## 2014-03-06 VITALS — BP 134/86 | HR 84 | Ht 69.0 in | Wt 247.0 lb

## 2014-03-06 DIAGNOSIS — E7800 Pure hypercholesterolemia, unspecified: Secondary | ICD-10-CM

## 2014-03-06 DIAGNOSIS — I10 Essential (primary) hypertension: Secondary | ICD-10-CM

## 2014-03-06 DIAGNOSIS — I2510 Atherosclerotic heart disease of native coronary artery without angina pectoris: Secondary | ICD-10-CM

## 2014-03-06 DIAGNOSIS — I4891 Unspecified atrial fibrillation: Secondary | ICD-10-CM

## 2014-03-06 NOTE — Progress Notes (Signed)
Ryan Mccall Date of Birth: August 08, 1946 Medical Record #[id number]  History of Present Illness: Mr. Ryan Mccall is seen back today for a followup visit. He has known CAD, HTN and HLD. He had remote CABG per Dr. Cyndia Mccall in 1999 with LIMA to LAD, SVG to 1st DX and SVG to intermediate and OM of the LCX. He did have transient atrial fibrillation during a stress test in 2009. His last stress Myoview in May 2015  showed normal perfusion. He was diagnosed with persistent atrial fibrillation in July 2014. Treated with rate control and anticoagulation.  Echocardiogram showed normal LV function with mild mitral insufficiency and moderate left atrial enlargement. On follow up today he reports he is doing well. He does have some chest tightness if he is working in hot weather. This is relieved with slowing down. Stays very busy mowing.   Current Outpatient Prescriptions on File Prior to Visit  Medication Sig Dispense Refill  . COD LIVER OIL PO Take by mouth daily.        Marland Kitchen ezetimibe (ZETIA) 10 MG tablet Take 10 mg by mouth daily.      . fexofenadine (ALLEGRA) 180 MG tablet Take 180 mg by mouth daily.        . fish oil-omega-3 fatty acids 1000 MG capsule Take by mouth daily.        . metoprolol (LOPRESSOR) 50 MG tablet Take 1.5 tablets (75 mg total) by mouth 2 (two) times daily.  270 tablet  3  . Multiple Vitamin (MULTIVITAMIN) tablet Take 1 tablet by mouth every other day.        . Rivaroxaban (XARELTO) 20 MG TABS tablet Take 1 tablet (20 mg total) by mouth daily.  90 tablet  2  . simvastatin (ZOCOR) 10 MG tablet Take 10 mg by mouth at bedtime.       No current facility-administered medications on file prior to visit.    Allergies  Allergen Reactions  . Shellfish Allergy     Past Medical History  Diagnosis Date  . Coronary artery disease     s/p remote CABG 1999 per Dr. Cyndia Mccall;  Lexiscan Myoview (01/2014):  No ischemia, not gated, normal study  . Hypertension   . Hypercholesterolemia   . Atrial  fibrillation   . 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 Coronary artery bypass graft  1999  . Knee arthroscopy w/ meniscal repair    . Excision of skin cancer      History  Smoking status  . Former Smoker  . Quit date: 09/29/1997  Smokeless tobacco  . Not on file    History  Alcohol Use No    Family History  Problem Relation Age of Onset  . Lung disease Father 76    Review of Systems: The review of systems is per the HPI.  All other systems were reviewed and are negative.  Physical Exam: BP 134/86  Pulse 84  Ht 5\' 9"  (1.753 m)  Wt 247 lb (112.038 kg)  BMI 36.46 kg/m2  SpO2 94% His weight is down 2 pounds. Patient is very pleasant and in no acute distress. Skin is warm and dry. Color is normal.  HEENT is unremarkable. Normocephalic/atraumatic. PERRL. Sclera are nonicteric. Neck is supple. No masses. No JVD. Lungs are clear. Cardiac exam shows an irregular rate and rhythm. Abdomen is soft. Extremities are without edema. Gait and ROM are intact. No gross  neurologic deficits noted.   LABORATORY DATA:   Lab Results  Component Value Date   WBC 5.5 01/23/2014   HGB 15.6 01/23/2014   HCT 46.1 01/23/2014   PLT 179.0 01/23/2014   GLUCOSE 95 01/23/2014   CHOL 125 01/23/2014   TRIG 71.0 01/23/2014   HDL 37.10* 01/23/2014   LDLCALC 74 01/23/2014   ALT 47 01/23/2014   AST 47* 01/23/2014   NA 139 01/23/2014   K 4.6 01/23/2014   CL 105 01/23/2014   CREATININE 0.8 01/23/2014   BUN 16 01/23/2014   CO2 27 01/23/2014   TSH 3.48 04/08/2013     Assessment / Plan: 1. CAD - remote CABG in 1999 - normal Myoview in May 2015. Continue risk factor modification.  2. Atrial fibrillation - permanent. Rate control is excellent on metoprolol. Patient is minimally symptomatic. Continue anticoagulation.  3. HTN -  well controlled.  4. HLD - on statin. Last LDL was 74.  Plan I will followup again in 6 months.

## 2014-03-06 NOTE — Patient Instructions (Signed)
Continue your current therapy  I will see you in 6 months.   

## 2014-03-25 ENCOUNTER — Emergency Department: Payer: Self-pay | Admitting: Internal Medicine

## 2014-04-06 NOTE — Telephone Encounter (Signed)
Encounter complete. 

## 2014-05-02 ENCOUNTER — Telehealth: Payer: Self-pay | Admitting: Cardiology

## 2014-05-02 NOTE — Telephone Encounter (Signed)
New Prob    Pt states he is currently on Xarelto. Experienced a nosebleed that lasted for about -35 minutes yesterday. He reports experiencing a second nosebleed this afternoon after mowing the grass. Pt is concerned and would like to speak to a nurse.

## 2014-05-02 NOTE — Telephone Encounter (Signed)
Left message for pt to call.

## 2014-05-03 NOTE — Telephone Encounter (Signed)
Returned call to patient no answer.LMTC. 

## 2014-05-03 NOTE — Telephone Encounter (Signed)
Received call from patient he stated since starting on Xarelto he has had 3 to 4 nose bleeds.Stated he had worse nose bleed this past Monday night.Stated took appox 35 mins.to stop bleeding.Dr.Jordan out of office today, will check with him and call you back 05/04/14.

## 2014-05-05 NOTE — Telephone Encounter (Signed)
Returned call to patient 05/04/14 Dr.Jordan advised needs to continue Xarelto.He advised needs to see ENT.

## 2014-05-10 ENCOUNTER — Telehealth: Payer: Self-pay | Admitting: Cardiology

## 2014-05-10 MED ORDER — RIVAROXABAN 20 MG PO TABS: 20.0000 mg | ORAL_TABLET | Freq: Every day | ORAL | Status: DC

## 2014-05-10 NOTE — Telephone Encounter (Signed)
Ryan Mccall is calling because he is asking for a new prescription for Xarelto to be sent to [facility] , because he has no more refills to get .Marland Kitchen Thanks

## 2014-10-12 ENCOUNTER — Ambulatory Visit (INDEPENDENT_AMBULATORY_CARE_PROVIDER_SITE_OTHER): Payer: PPO | Admitting: Cardiology

## 2014-10-12 ENCOUNTER — Encounter: Payer: Self-pay | Admitting: Cardiology

## 2014-10-12 VITALS — BP 130/84 | HR 70 | Ht 69.0 in | Wt 248.1 lb

## 2014-10-12 DIAGNOSIS — E7800 Pure hypercholesterolemia, unspecified: Secondary | ICD-10-CM

## 2014-10-12 DIAGNOSIS — I10 Essential (primary) hypertension: Secondary | ICD-10-CM

## 2014-10-12 DIAGNOSIS — Z6833 Body mass index (BMI) 33.0-33.9, adult: Secondary | ICD-10-CM | POA: Insufficient documentation

## 2014-10-12 DIAGNOSIS — I25810 Atherosclerosis of coronary artery bypass graft(s) without angina pectoris: Secondary | ICD-10-CM

## 2014-10-12 DIAGNOSIS — I482 Chronic atrial fibrillation, unspecified: Secondary | ICD-10-CM

## 2014-10-12 DIAGNOSIS — E669 Obesity, unspecified: Secondary | ICD-10-CM

## 2014-10-12 NOTE — Progress Notes (Signed)
Ryan Mccall Date of Birth: 07/05/1946 Medical Record #[id number]  History of Present Illness: Ryan Mccall is seen back today for follow up of CAD. He has known CAD, HTN and HLD. He had remote CABG per Dr. Cyndia Bent in 1999 with LIMA to LAD, SVG to 1st DX and SVG to intermediate and OM of the LCX. He did have transient atrial fibrillation during a stress test in 2009. His last stress Myoview in May 2015  showed normal perfusion. He was diagnosed with persistent atrial fibrillation in July 2014. Treated with rate control and anticoagulation.  Echocardiogram showed normal LV function with mild mitral insufficiency and moderate left atrial enlargement.  On follow up today he reports he is doing well. He has no chest pain or SOB. No edema. He has noticed some slight hemorrhoidal bleeding about once a month and he has a rare nosebleed. He doesn't really exercise but states he walks a lot at work. BP at home has been normal.   Current Outpatient Prescriptions on File Prior to Visit  Medication Sig Dispense Refill  . COD LIVER OIL PO Take by mouth daily.      Marland Kitchen ezetimibe (ZETIA) 10 MG tablet Take 10 mg by mouth daily.    . fexofenadine (ALLEGRA) 180 MG tablet Take 180 mg by mouth daily.      . fish oil-omega-3 fatty acids 1000 MG capsule Take by mouth daily.      . metoprolol (LOPRESSOR) 50 MG tablet Take 1.5 tablets (75 mg total) by mouth 2 (two) times daily. 270 tablet 3  . Multiple Vitamin (MULTIVITAMIN) tablet Take 1 tablet by mouth every other day.      . rivaroxaban (XARELTO) 20 MG TABS tablet Take 1 tablet (20 mg total) by mouth daily. 90 tablet 2  . simvastatin (ZOCOR) 10 MG tablet Take 10 mg by mouth at bedtime.     No current facility-administered medications on file prior to visit.    Allergies  Allergen Reactions  . Shellfish Allergy     Past Medical History  Diagnosis Date  . Coronary artery disease     s/p remote CABG 1999 per Dr. Cyndia Bent;  Lexiscan Myoview (01/2014):  No ischemia,  not gated, normal study  . Hypertension   . Hypercholesterolemia   . Atrial fibrillation   . 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 Coronary artery bypass graft  1999  . Knee arthroscopy w/ meniscal repair    . Excision of skin cancer      History  Smoking status  . Former Smoker  . Quit date: 09/29/1997  Smokeless tobacco  . Not on file    History  Alcohol Use No    Family History  Problem Relation Age of Onset  . Lung disease Father 56    Review of Systems: The review of systems is per the HPI.  All other systems were reviewed and are negative.  Physical Exam: BP 130/84 mmHg  Pulse 70  Ht 5\' 9"  (1.753 m)  Wt 248 lb 2 oz (112.549 kg)  BMI 36.63 kg/m2 His weight is down 2 pounds. Patient is very pleasant and in no acute distress. Skin is warm and dry. Color is normal.  HEENT is unremarkable. Normocephalic/atraumatic. PERRL. Sclera are nonicteric. Neck is supple. No masses. No JVD. Lungs are clear. Cardiac exam shows an irregular rate and rhythm. Normal S1-2. No gallop or murmur.  Abdomen is soft. Extremities are without edema. Gait and ROM are intact. No gross neurologic deficits noted.   LABORATORY DATA:   Lab Results  Component Value Date   WBC 5.5 01/23/2014   HGB 15.6 01/23/2014   HCT 46.1 01/23/2014   PLT 179.0 01/23/2014   GLUCOSE 95 01/23/2014   CHOL 125 01/23/2014   TRIG 71.0 01/23/2014   HDL 37.10* 01/23/2014   LDLCALC 74 01/23/2014   ALT 47 01/23/2014   AST 47* 01/23/2014   NA 139 01/23/2014   K 4.6 01/23/2014   CL 105 01/23/2014   CREATININE 0.8 01/23/2014   BUN 16 01/23/2014   CO2 27 01/23/2014   TSH 3.48 04/08/2013   Labs from 08/30/14 brought from primary care and reviewed.  CBC normal except plt count of 149K. Chemistries are normal A1c 5.6% Chol- 130, trig-87, HDL-41, LDL 72  Assessment / Plan: 1. CAD - remote CABG in 1999 - normal Myoview in May 2015. Patient  remains asymptomatic. Continue risk factor modification. Continue metoprolol.  2. Atrial fibrillation - permanent. Rate control is excellent on metoprolol. Patient is minimally symptomatic. Continue anticoagulation.  3. HTN -  well controlled.  4. HLD - on statin.controlled.  5. Obesity. Recommend diet to try and lose weight. Restrict sweets, sodas and carbohydrates. Recommend increasing aerobic activity  Plan I will followup again in 6 months.

## 2014-10-12 NOTE — Patient Instructions (Addendum)
Continue your current therapy  I will see you in 6 months  Try and lose weight by reducing intake of sugar/sodas

## 2015-03-26 ENCOUNTER — Other Ambulatory Visit: Payer: Self-pay

## 2015-04-10 ENCOUNTER — Ambulatory Visit (INDEPENDENT_AMBULATORY_CARE_PROVIDER_SITE_OTHER): Payer: PPO | Admitting: Cardiology

## 2015-04-10 ENCOUNTER — Encounter: Payer: Self-pay | Admitting: Cardiology

## 2015-04-10 VITALS — BP 134/84 | HR 63 | Ht 69.0 in | Wt 243.5 lb

## 2015-04-10 DIAGNOSIS — I25810 Atherosclerosis of coronary artery bypass graft(s) without angina pectoris: Secondary | ICD-10-CM | POA: Diagnosis not present

## 2015-04-10 DIAGNOSIS — E7800 Pure hypercholesterolemia, unspecified: Secondary | ICD-10-CM

## 2015-04-10 DIAGNOSIS — I482 Chronic atrial fibrillation, unspecified: Secondary | ICD-10-CM

## 2015-04-10 DIAGNOSIS — I10 Essential (primary) hypertension: Secondary | ICD-10-CM

## 2015-04-10 MED ORDER — METOPROLOL TARTRATE 50 MG PO TABS: 75.0000 mg | ORAL_TABLET | Freq: Two times a day (BID) | ORAL | Status: DC

## 2015-04-10 MED ORDER — RIVAROXABAN 20 MG PO TABS: 20.0000 mg | ORAL_TABLET | Freq: Every day | ORAL | Status: DC

## 2015-04-10 NOTE — Progress Notes (Signed)
Ryan Mccall Date of Birth: May 02, 1946 Medical Record #[id number]  History of Present Illness: Ryan Mccall is seen back today for follow up of CAD. He has known CAD, HTN and HLD. He had remote CABG per Dr. Cyndia Bent in 1999 with LIMA to LAD, SVG to 1st DX and SVG to intermediate and OM of the LCX. He did have transient atrial fibrillation during a stress test in 2009. His last stress Myoview in May 2015 showed normal perfusion. He was diagnosed with persistent atrial fibrillation in July 2014. Treated with rate control and anticoagulation.  Echocardiogram showed normal LV function with mild mitral insufficiency and moderate left atrial enlargement.  On follow up today he reports he is doing well. He only gets tightness in his chest if he really pushes it. May get some mild swelling in his legs late in the day.  He doesn't really exercise but states he walks a lot at work.   Current Outpatient Prescriptions on File Prior to Visit  Medication Sig Dispense Refill  . COD LIVER OIL PO Take by mouth daily.      Marland Kitchen ezetimibe (ZETIA) 10 MG tablet Take 10 mg by mouth daily.    . fexofenadine (ALLEGRA) 180 MG tablet Take 180 mg by mouth daily.      . fish oil-omega-3 fatty acids 1000 MG capsule Take by mouth daily.      . Multiple Vitamin (MULTIVITAMIN) tablet Take 1 tablet by mouth every other day.      . rivaroxaban (XARELTO) 20 MG TABS tablet Take 1 tablet (20 mg total) by mouth daily. 90 tablet 2  . simvastatin (ZOCOR) 10 MG tablet Take 10 mg by mouth at bedtime.     No current facility-administered medications on file prior to visit.    Allergies  Allergen Reactions  . Shellfish Allergy     Past Medical History  Diagnosis Date  . Coronary artery disease     s/p remote CABG 1999 per Dr. Cyndia Bent;  Lexiscan Myoview (01/2014):  No ischemia, not gated, normal study  . Hypertension   . Hypercholesterolemia   . Atrial fibrillation   . 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 Coronary artery bypass graft  1999  . Knee arthroscopy w/ meniscal repair    . Excision of skin cancer      History  Smoking status  . Former Smoker  . Quit date: 09/29/1997  Smokeless tobacco  . Not on file    History  Alcohol Use No    Family History  Problem Relation Age of Onset  . Lung disease Father 29    Review of Systems: The review of systems is per the HPI.  All other systems were reviewed and are negative.  Physical Exam: BP 134/84 mmHg  Pulse 63  Ht 5\' 9"  (1.753 m)  Wt 110.451 kg (243 lb 8 oz)  BMI 35.94 kg/m2 His weight is down 2 pounds. Patient is very pleasant and in no acute distress. Skin is warm and dry. Color is normal.  HEENT is unremarkable. Normocephalic/atraumatic. PERRL. Sclera are nonicteric. Neck is supple. No masses. No JVD. Lungs are clear. Cardiac exam shows an irregular rate and rhythm. Normal S1-2. No gallop or murmur. Abdomen is soft. Extremities are without edema. Gait and ROM are intact. No gross neurologic deficits noted.   LABORATORY DATA:   Lab Results  Component Value Date  WBC 5.5 01/23/2014   HGB 15.6 01/23/2014   HCT 46.1 01/23/2014   PLT 179.0 01/23/2014   GLUCOSE 95 01/23/2014   CHOL 125 01/23/2014   TRIG 71.0 01/23/2014   HDL 37.10* 01/23/2014   LDLCALC 74 01/23/2014   ALT 47 01/23/2014   AST 47* 01/23/2014   NA 139 01/23/2014   K 4.6 01/23/2014   CL 105 01/23/2014   CREATININE 0.8 01/23/2014   BUN 16 01/23/2014   CO2 27 01/23/2014   TSH 3.48 04/08/2013   Labs from 01/05/15 brought from primary care and reviewed.  CBC normal  Chemistries are normal A1c 5.6% Chol- 133, trig-82, HDL-44, LDL 71  Ecg today shows atrial fibrillation with rate 63 bpm, otherwise normal. I have personally reviewed and interpreted this study.   Assessment / Plan: 1. CAD - remote CABG in 1999 - normal Myoview in May 2015. Patient has mild class 1 symptoms. Continue risk factor  modification. Continue metoprolol.  2. Atrial fibrillation - permanent. Rate control is excellent on metoprolol. Patient is minimally symptomatic. Continue anticoagulation with Xarelto.  3. HTN -  well controlled.  4. HLD - on statin.controlled.  5. Obesity. He has lost 5 lbs. I encouraged weight loss efforts.    Plan I will followup again in 6 months.

## 2015-04-10 NOTE — Patient Instructions (Signed)
Continue your current therapy  I will see you in 6 months.   

## 2015-04-10 NOTE — Addendum Note (Signed)
Addended by: Ulice Brilliant T on: 04/10/2015 11:06 AM   Modules accepted: Orders

## 2015-05-28 ENCOUNTER — Other Ambulatory Visit: Payer: Self-pay | Admitting: Cardiology

## 2015-06-13 ENCOUNTER — Telehealth: Payer: Self-pay | Admitting: Cardiology

## 2015-06-13 ENCOUNTER — Other Ambulatory Visit: Payer: Self-pay

## 2015-06-13 MED ORDER — RIVAROXABAN 20 MG PO TABS: 20.0000 mg | ORAL_TABLET | Freq: Every day | ORAL | Status: DC

## 2015-06-13 NOTE — Telephone Encounter (Signed)
Pt wants to talk to you about his lab results that he received from another doctor.

## 2015-06-13 NOTE — Telephone Encounter (Signed)
Returned call to patient he stated he had lab work done at PCP.He stated he wanted Dr.Jordan to review lab some abnormal values.He will fax lab to office at fax # [phone].Advised I will show Dr.Jordan tomorrow 9/15 and call you back.

## 2015-06-14 NOTE — Telephone Encounter (Signed)
Returned call to patient Dr.Jordan reviewed lab work you faxed from PCP cbc,cmet,lipid panel,a1c.Labs ok.Continue same medication.

## 2015-07-13 ENCOUNTER — Ambulatory Visit
Admission: RE | Admit: 2015-07-13 | Discharge: 2015-07-13 | Disposition: A | Discharge status: Home / Self Care | Payer: Worker's Compensation | Source: Ambulatory Visit | Attending: Nurse Practitioner | Admitting: Nurse Practitioner

## 2015-07-13 ENCOUNTER — Other Ambulatory Visit: Payer: Self-pay | Admitting: Nurse Practitioner

## 2015-07-13 ENCOUNTER — Other Ambulatory Visit
Admission: RE | Admit: 2015-07-13 | Discharge: 2015-07-13 | Disposition: A | Discharge status: Home / Self Care | Payer: Worker's Compensation | Source: Ambulatory Visit | Attending: Nurse Practitioner | Admitting: Nurse Practitioner

## 2015-07-13 DIAGNOSIS — S62102A Fracture of unspecified carpal bone, left wrist, initial encounter for closed fracture: Secondary | ICD-10-CM | POA: Diagnosis not present

## 2015-07-13 DIAGNOSIS — Z9181 History of falling: Secondary | ICD-10-CM

## 2015-07-13 DIAGNOSIS — W19XXXA Unspecified fall, initial encounter: Secondary | ICD-10-CM | POA: Diagnosis not present

## 2015-07-13 DIAGNOSIS — T1490XA Injury, unspecified, initial encounter: Secondary | ICD-10-CM

## 2015-07-13 DIAGNOSIS — S6992XA Unspecified injury of left wrist, hand and finger(s), initial encounter: Secondary | ICD-10-CM | POA: Diagnosis present

## 2015-08-09 ENCOUNTER — Encounter: Payer: Self-pay | Admitting: Cardiology

## 2015-08-09 ENCOUNTER — Ambulatory Visit (INDEPENDENT_AMBULATORY_CARE_PROVIDER_SITE_OTHER): Payer: PPO | Admitting: Cardiology

## 2015-08-09 ENCOUNTER — Telehealth: Payer: Self-pay

## 2015-08-09 VITALS — BP 148/94 | HR 66 | Ht 69.0 in | Wt 236.1 lb

## 2015-08-09 DIAGNOSIS — I482 Chronic atrial fibrillation, unspecified: Secondary | ICD-10-CM

## 2015-08-09 DIAGNOSIS — I10 Essential (primary) hypertension: Secondary | ICD-10-CM

## 2015-08-09 DIAGNOSIS — I25810 Atherosclerosis of coronary artery bypass graft(s) without angina pectoris: Secondary | ICD-10-CM

## 2015-08-09 DIAGNOSIS — E7800 Pure hypercholesterolemia, unspecified: Secondary | ICD-10-CM

## 2015-08-09 NOTE — Telephone Encounter (Signed)
Spoke to patient xarelto 20 mg samples left at [employer] office front desk.

## 2015-08-09 NOTE — Progress Notes (Signed)
Ryan Mccall Date of Birth: 08-18-1946 Medical Record [id number]  History of Present Illness: Ryan Mccall is seen back today for follow up of CAD. He has known CAD, HTN and HLD. He had remote CABG per Dr. Cyndia Bent in 1999 with LIMA to LAD, SVG to 1st DX and SVG to intermediate and OM of the LCX. He did have transient atrial fibrillation during a stress test in 2009. His last stress Myoview in May 2015 showed normal perfusion. He was diagnosed with persistent atrial fibrillation in July 2014. Treated with rate control and anticoagulation.  Echocardiogram showed normal LV function with mild mitral insufficiency and moderate left atrial enlargement.  On follow up today he reports he is doing well. He only gets SOB in the early morning walking across the lot at work. Otherwise no chest pain or SOB.  His main form of exercise is mowing. He has lost 7 lbs.   Current Outpatient Prescriptions on File Prior to Visit  Medication Sig Dispense Refill  . COD LIVER OIL PO Take by mouth daily.      Marland Kitchen ezetimibe (ZETIA) 10 MG tablet Take 10 mg by mouth daily.    . fexofenadine (ALLEGRA) 180 MG tablet Take 180 mg by mouth daily.      . fish oil-omega-3 fatty acids 1000 MG capsule Take by mouth daily.      . metoprolol (LOPRESSOR) 50 MG tablet Take 1.5 tablets (75 mg total) by mouth 2 (two) times daily. 270 tablet 3  . Multiple Vitamin (MULTIVITAMIN) tablet Take 1 tablet by mouth every other day.      . rivaroxaban (XARELTO) 20 MG TABS tablet Take 1 tablet (20 mg total) by mouth daily with supper. 90 tablet 3  . simvastatin (ZOCOR) 10 MG tablet Take 10 mg by mouth at bedtime.     No current facility-administered medications on file prior to visit.    Allergies  Allergen Reactions  . Shellfish Allergy     Past Medical History  Diagnosis Date  . Coronary artery disease     s/p remote CABG 1999 per Dr. Cyndia Bent;  Lexiscan Myoview (01/2014):  No ischemia, not gated, normal study  . Hypertension   .  Hypercholesterolemia   . Atrial fibrillation ([place])   . 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 Coronary artery bypass graft  1999  . Knee arthroscopy w/ meniscal repair    . Excision of skin cancer      History  Smoking status  . Former Smoker  . Quit date: 09/29/1997  Smokeless tobacco  . Not on file    History  Alcohol Use No    Family History  Problem Relation Age of Onset  . Lung disease Father 55    Review of Systems: The review of systems is per the HPI.  All other systems were reviewed and are negative.  Physical Exam: BP 148/94 mmHg  Pulse 66  Ht 5\' 9"  (1.753 m)  Wt 107.077 kg (236 lb 1 oz)  BMI 34.84 kg/m2 His weight is down 2 pounds. Patient is very pleasant and in no acute distress. Skin is warm and dry. Color is normal.  HEENT is unremarkable. Normocephalic/atraumatic. PERRL. Sclera are nonicteric. Neck is supple. No masses. No JVD. Lungs are clear. Cardiac exam shows an irregular rate and rhythm. Normal S1-2. No gallop or murmur. Abdomen is soft. Extremities are without edema. Gait and  ROM are intact. No gross neurologic deficits noted.   LABORATORY DATA:   Lab Results  Component Value Date   WBC 5.5 01/23/2014   HGB 15.6 01/23/2014   HCT 46.1 01/23/2014   PLT 179.0 01/23/2014   GLUCOSE 95 01/23/2014   CHOL 125 01/23/2014   TRIG 71.0 01/23/2014   HDL 37.10* 01/23/2014   LDLCALC 74 01/23/2014   ALT 47 01/23/2014   AST 47* 01/23/2014   NA 139 01/23/2014   K 4.6 01/23/2014   CL 105 01/23/2014   CREATININE 0.8 01/23/2014   BUN 16 01/23/2014   CO2 27 01/23/2014   TSH 3.48 04/08/2013   Labs from 04/2715 brought from primary care and reviewed.  CBC normal except platelet count 144k. Chemistries are normal A1c 5.5% Chol- 118, trig-52, HDL-43, LDL 65     Assessment / Plan: 1. CAD - remote CABG in 1999 - normal Myoview in May 2015. Patient has mild class 1 symptoms.  Continue risk factor modification. Continue metoprolol.  2. Atrial fibrillation - permanent. Rate control is excellent on metoprolol. Patient is minimally symptomatic. Continue anticoagulation with Xarelto.  3. HTN -  well controlled.  4. HLD - on statin.controlled.  5. Obesity. He has lost 7 lbs. I encouraged weight loss efforts.    Plan I will followup again in 6 months.

## 2015-08-09 NOTE — Patient Instructions (Signed)
Continue your current therapy  I will see you in 6 months.   

## 2015-08-13 ENCOUNTER — Encounter: Payer: Self-pay | Admitting: Cardiology

## 2015-10-05 DIAGNOSIS — I2510 Atherosclerotic heart disease of native coronary artery without angina pectoris: Secondary | ICD-10-CM | POA: Diagnosis not present

## 2015-10-05 DIAGNOSIS — I482 Chronic atrial fibrillation: Secondary | ICD-10-CM | POA: Diagnosis not present

## 2015-10-05 DIAGNOSIS — E785 Hyperlipidemia, unspecified: Secondary | ICD-10-CM | POA: Diagnosis not present

## 2015-10-05 DIAGNOSIS — I10 Essential (primary) hypertension: Secondary | ICD-10-CM | POA: Diagnosis not present

## 2015-10-05 DIAGNOSIS — R7309 Other abnormal glucose: Secondary | ICD-10-CM | POA: Diagnosis not present

## 2015-10-15 ENCOUNTER — Telehealth: Payer: Self-pay | Admitting: Cardiology

## 2015-10-15 NOTE — Telephone Encounter (Signed)
Ryan Mccall called.  Notification of medication interaction: Xarelto and Diclofenac. Pt was prescribed the Diclofenac gel and this was filled locally.  Izora Gala is sending fax to clarify request. I gave verbal for Xarelto refill. Advise if anything else required.

## 2015-10-15 NOTE — Telephone Encounter (Signed)
Ok to use NSAID gel (diclofenac) with NOAC.

## 2015-10-15 NOTE — Telephone Encounter (Signed)
Returned call to patient.Dr.Jordan advised ok to use voltaren gel.Form faxed back to [employer] at fax # [phone].

## 2016-02-07 DIAGNOSIS — L821 Other seborrheic keratosis: Secondary | ICD-10-CM | POA: Diagnosis not present

## 2016-02-07 DIAGNOSIS — L570 Actinic keratosis: Secondary | ICD-10-CM | POA: Diagnosis not present

## 2016-02-12 DIAGNOSIS — I482 Chronic atrial fibrillation: Secondary | ICD-10-CM | POA: Diagnosis not present

## 2016-02-12 DIAGNOSIS — I10 Essential (primary) hypertension: Secondary | ICD-10-CM | POA: Diagnosis not present

## 2016-02-12 DIAGNOSIS — M109 Gout, unspecified: Secondary | ICD-10-CM | POA: Diagnosis not present

## 2016-02-12 DIAGNOSIS — R7303 Prediabetes: Secondary | ICD-10-CM | POA: Diagnosis not present

## 2016-02-12 DIAGNOSIS — I2510 Atherosclerotic heart disease of native coronary artery without angina pectoris: Secondary | ICD-10-CM | POA: Diagnosis not present

## 2016-02-12 DIAGNOSIS — M9903 Segmental and somatic dysfunction of lumbar region: Secondary | ICD-10-CM | POA: Diagnosis not present

## 2016-02-12 DIAGNOSIS — E785 Hyperlipidemia, unspecified: Secondary | ICD-10-CM | POA: Diagnosis not present

## 2016-02-12 DIAGNOSIS — Z6836 Body mass index (BMI) 36.0-36.9, adult: Secondary | ICD-10-CM | POA: Diagnosis not present

## 2016-02-12 DIAGNOSIS — M9902 Segmental and somatic dysfunction of thoracic region: Secondary | ICD-10-CM | POA: Diagnosis not present

## 2016-03-27 ENCOUNTER — Ambulatory Visit (INDEPENDENT_AMBULATORY_CARE_PROVIDER_SITE_OTHER): Payer: PPO | Admitting: Cardiology

## 2016-03-27 ENCOUNTER — Encounter: Payer: Self-pay | Admitting: Cardiology

## 2016-03-27 VITALS — BP 136/80 | HR 68 | Ht 69.0 in | Wt 236.2 lb

## 2016-03-27 DIAGNOSIS — I482 Chronic atrial fibrillation, unspecified: Secondary | ICD-10-CM

## 2016-03-27 DIAGNOSIS — I25708 Atherosclerosis of coronary artery bypass graft(s), unspecified, with other forms of angina pectoris: Secondary | ICD-10-CM

## 2016-03-27 DIAGNOSIS — I10 Essential (primary) hypertension: Secondary | ICD-10-CM

## 2016-03-27 DIAGNOSIS — E7800 Pure hypercholesterolemia, unspecified: Secondary | ICD-10-CM

## 2016-03-27 DIAGNOSIS — E669 Obesity, unspecified: Secondary | ICD-10-CM

## 2016-03-27 NOTE — Patient Instructions (Signed)
We will schedule you for a nuclear stress test.  Continue your current medication   

## 2016-03-27 NOTE — Progress Notes (Signed)
Ryan Mccall Date of Birth: 05-02-1946 Medical Record [id number]  History of Present Illness: Mr. Dai is seen back today for follow up of CAD. He has known CAD, HTN and HLD. He had remote CABG per Dr. Cyndia Bent in 1999 with LIMA to LAD, SVG to 1st DX and SVG to intermediate and OM of the LCX. He did have transient atrial fibrillation during a stress test in 2009. His last stress Myoview in May 2015 showed normal perfusion. He was diagnosed with persistent atrial fibrillation in July 2014. Treated with rate control and anticoagulation.  Echocardiogram showed normal LV function with mild mitral insufficiency and moderate left atrial enlargement.  On follow up today he reports he is doing well. He does get SOB in the early morning walking across the lot at work and when he is pushing it mowing. He thinks this is a little worse than last year.  His main form of exercise is mowing.   Current Outpatient Prescriptions on File Prior to Visit  Medication Sig Dispense Refill  . COD LIVER OIL PO Take by mouth daily.      Marland Kitchen ezetimibe (ZETIA) 10 MG tablet Take 10 mg by mouth daily.    . fexofenadine (ALLEGRA) 180 MG tablet Take 180 mg by mouth daily.      . fish oil-omega-3 fatty acids 1000 MG capsule Take by mouth daily.      . metoprolol (LOPRESSOR) 50 MG tablet Take 1.5 tablets (75 mg total) by mouth 2 (two) times daily. 270 tablet 3  . Multiple Vitamin (MULTIVITAMIN) tablet Take 1 tablet by mouth every other day.      . rivaroxaban (XARELTO) 20 MG TABS tablet Take 1 tablet (20 mg total) by mouth daily with supper. 90 tablet 3  . simvastatin (ZOCOR) 10 MG tablet Take 10 mg by mouth at bedtime.     No current facility-administered medications on file prior to visit.    Allergies  Allergen Reactions  . Shellfish Allergy     Past Medical History  Diagnosis Date  . Coronary artery disease     s/p remote CABG 1999 per Dr. Cyndia Bent;  Lexiscan Myoview (01/2014):  No ischemia, not gated, normal study  .  Hypertension   . Hypercholesterolemia   . Atrial fibrillation ([place])   . 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 Coronary artery bypass graft  1999  . Knee arthroscopy w/ meniscal repair    . Excision of skin cancer      History  Smoking status  . Former Smoker  . Quit date: 09/29/1997  Smokeless tobacco  . Not on file    History  Alcohol Use No    Family History  Problem Relation Age of Onset  . Lung disease Father 62    Review of Systems: The review of systems is per the HPI.  All other systems were reviewed and are negative.  Physical Exam: BP 136/80 mmHg  Pulse 68  Ht 5\' 9"  (1.753 m)  Wt 236 lb 3.2 oz (107.14 kg)  BMI 34.86 kg/m2 His weight is down 2 pounds. Patient is very pleasant, obese,  and in no acute distress. Skin is warm and dry. Color is normal.  HEENT is unremarkable. Normocephalic/atraumatic. PERRL. Sclera are nonicteric. Neck is supple. No masses. No JVD. Lungs are clear. Cardiac exam shows an irregular rate and rhythm. Normal S1-2. No gallop or murmur. Abdomen  is soft. Extremities are without edema. Gait and ROM are intact. No gross neurologic deficits noted.   LABORATORY DATA:   Lab Results  Component Value Date   WBC 5.5 01/23/2014   HGB 15.6 01/23/2014   HCT 46.1 01/23/2014   PLT 179.0 01/23/2014   GLUCOSE 95 01/23/2014   CHOL 125 01/23/2014   TRIG 71.0 01/23/2014   HDL 37.10* 01/23/2014   LDLCALC 74 01/23/2014   ALT 47 01/23/2014   AST 47* 01/23/2014   NA 139 01/23/2014   K 4.6 01/23/2014   CL 105 01/23/2014   CREATININE 0.8 01/23/2014   BUN 16 01/23/2014   CO2 27 01/23/2014   TSH 3.48 04/08/2013   Labs from 02/12/16 from primary care and reviewed.  CBC normal  Chemistries are normal A1c 5.7% Chol- 153, trig-115, HDL-51, LDL 79  Ecg today shows atrial fibrillation with rate 68. Otherwise normal. I have personally reviewed and interpreted this  study.    Assessment / Plan: 1. CAD - remote CABG in 1999 - normal Myoview in May 2015. Patient has mild class 1-2 symptoms. Recommend updating [employer] study now. Continue risk factor modification. Continue metoprolol.  2. Atrial fibrillation - permanent. Rate control is excellent on metoprolol. Patient is minimally symptomatic. Continue anticoagulation with Xarelto.  3. HTN -  well controlled.  4. HLD - on statin.controlled.  5. Obesity.encouraged weight loss efforts.    Plan I will followup again in 6 months.

## 2016-04-09 ENCOUNTER — Telehealth (HOSPITAL_COMMUNITY): Payer: Self-pay

## 2016-04-09 NOTE — Telephone Encounter (Signed)
Encounter complete. 

## 2016-04-11 ENCOUNTER — Ambulatory Visit (HOSPITAL_COMMUNITY)
Admission: RE | Admit: 2016-04-11 | Discharge: 2016-04-11 | Disposition: A | Discharge status: Home / Self Care | Payer: PPO | Source: Ambulatory Visit | Attending: Cardiovascular Disease | Admitting: Cardiovascular Disease

## 2016-04-11 DIAGNOSIS — R0609 Other forms of dyspnea: Secondary | ICD-10-CM | POA: Insufficient documentation

## 2016-04-11 DIAGNOSIS — Z6834 Body mass index (BMI) 34.0-34.9, adult: Secondary | ICD-10-CM | POA: Diagnosis not present

## 2016-04-11 DIAGNOSIS — E7800 Pure hypercholesterolemia, unspecified: Secondary | ICD-10-CM

## 2016-04-11 DIAGNOSIS — I482 Chronic atrial fibrillation, unspecified: Secondary | ICD-10-CM

## 2016-04-11 DIAGNOSIS — R002 Palpitations: Secondary | ICD-10-CM | POA: Diagnosis not present

## 2016-04-11 DIAGNOSIS — R5383 Other fatigue: Secondary | ICD-10-CM | POA: Diagnosis not present

## 2016-04-11 DIAGNOSIS — I25708 Atherosclerosis of coronary artery bypass graft(s), unspecified, with other forms of angina pectoris: Secondary | ICD-10-CM | POA: Diagnosis not present

## 2016-04-11 DIAGNOSIS — I10 Essential (primary) hypertension: Secondary | ICD-10-CM | POA: Diagnosis not present

## 2016-04-11 DIAGNOSIS — Z87891 Personal history of nicotine dependence: Secondary | ICD-10-CM | POA: Diagnosis not present

## 2016-04-11 DIAGNOSIS — E669 Obesity, unspecified: Secondary | ICD-10-CM

## 2016-04-11 DIAGNOSIS — Z8249 Family history of ischemic heart disease and other diseases of the circulatory system: Secondary | ICD-10-CM | POA: Insufficient documentation

## 2016-04-11 LAB — MYOCARDIAL PERFUSION IMAGING
CHL CUP NUCLEAR SRS: 1
CHL CUP NUCLEAR SSS: 1
CSEPPHR: 78 {beats}/min
LV dias vol: 62 mL (ref 62–150)
LVSYSVOL: 136 mL
Rest HR: 59 {beats}/min
SDS: 0
TID: 1.11

## 2016-04-11 MED ORDER — TECHNETIUM TC 99M TETROFOSMIN IV KIT
10.8000 | PACK | Freq: Once | INTRAVENOUS | Status: AC | PRN
  Administered 2016-04-11: 11 via INTRAVENOUS
  Filled 2016-04-11: qty 11

## 2016-04-11 MED ORDER — REGADENOSON 0.4 MG/5ML IV SOLN
0.4000 mg | Freq: Once | INTRAVENOUS | Status: AC
  Administered 2016-04-11: 0.4 mg via INTRAVENOUS

## 2016-04-11 MED ORDER — TECHNETIUM TC 99M TETROFOSMIN IV KIT
31.1000 | PACK | Freq: Once | INTRAVENOUS | Status: AC | PRN
  Administered 2016-04-11: 31.1 via INTRAVENOUS
  Filled 2016-04-11: qty 31

## 2016-06-17 DIAGNOSIS — Z6835 Body mass index (BMI) 35.0-35.9, adult: Secondary | ICD-10-CM | POA: Diagnosis not present

## 2016-06-17 DIAGNOSIS — I2510 Atherosclerotic heart disease of native coronary artery without angina pectoris: Secondary | ICD-10-CM | POA: Diagnosis not present

## 2016-06-17 DIAGNOSIS — R7303 Prediabetes: Secondary | ICD-10-CM | POA: Diagnosis not present

## 2016-06-17 DIAGNOSIS — M109 Gout, unspecified: Secondary | ICD-10-CM | POA: Diagnosis not present

## 2016-06-17 DIAGNOSIS — I482 Chronic atrial fibrillation: Secondary | ICD-10-CM | POA: Diagnosis not present

## 2016-06-17 DIAGNOSIS — E785 Hyperlipidemia, unspecified: Secondary | ICD-10-CM | POA: Diagnosis not present

## 2016-06-17 DIAGNOSIS — I10 Essential (primary) hypertension: Secondary | ICD-10-CM | POA: Diagnosis not present

## 2016-06-17 DIAGNOSIS — M9903 Segmental and somatic dysfunction of lumbar region: Secondary | ICD-10-CM | POA: Diagnosis not present

## 2016-06-17 DIAGNOSIS — M9902 Segmental and somatic dysfunction of thoracic region: Secondary | ICD-10-CM | POA: Diagnosis not present

## 2016-07-09 DIAGNOSIS — M9902 Segmental and somatic dysfunction of thoracic region: Secondary | ICD-10-CM | POA: Diagnosis not present

## 2016-07-09 DIAGNOSIS — M9903 Segmental and somatic dysfunction of lumbar region: Secondary | ICD-10-CM | POA: Diagnosis not present

## 2016-08-12 DIAGNOSIS — D2239 Melanocytic nevi of other parts of face: Secondary | ICD-10-CM | POA: Diagnosis not present

## 2016-08-12 DIAGNOSIS — L219 Seborrheic dermatitis, unspecified: Secondary | ICD-10-CM | POA: Diagnosis not present

## 2016-08-12 DIAGNOSIS — D225 Melanocytic nevi of trunk: Secondary | ICD-10-CM | POA: Diagnosis not present

## 2016-08-12 DIAGNOSIS — L820 Inflamed seborrheic keratosis: Secondary | ICD-10-CM | POA: Diagnosis not present

## 2016-08-12 DIAGNOSIS — L814 Other melanin hyperpigmentation: Secondary | ICD-10-CM | POA: Diagnosis not present

## 2016-09-21 NOTE — Progress Notes (Signed)
[name] Date of Birth: 08-Oct-1945 Medical Record [id number]  History of Present Illness: Mr. Maliszewski is seen back today for follow up of CAD. He has known CAD, HTN and HLD. He had remote CABG per Dr. Cyndia Bent in 1999 with LIMA to LAD, SVG to 1st DX and SVG to intermediate and OM of the LCX. He did have transient atrial fibrillation during a stress test in 2009. His last stress Myoview in July 2017showed normal perfusion. He was diagnosed with persistent atrial fibrillation in July 2014. Treated with rate control and anticoagulation.  Echocardiogram showed normal LV function with mild mitral insufficiency and moderate left atrial enlargement.  On follow up today he reports he is doing well. He does get chest tightness and discomfort when walking uphill in the cold. It resolves with stopping to rest. Has not used Ntg. Symptoms similar to last visit. He has gained 3 lbs over the holidays.  Current Outpatient Prescriptions on File Prior to Visit  Medication Sig Dispense Refill  . COD LIVER OIL PO Take by mouth daily.      Marland Kitchen ezetimibe (ZETIA) 10 MG tablet Take 10 mg by mouth daily.    . fexofenadine (ALLEGRA) 180 MG tablet Take 180 mg by mouth daily.      . fish oil-omega-3 fatty acids 1000 MG capsule Take by mouth daily.      . metoprolol (LOPRESSOR) 50 MG tablet Take 1.5 tablets (75 mg total) by mouth 2 (two) times daily. 270 tablet 3  . Multiple Vitamin (MULTIVITAMIN) tablet Take 1 tablet by mouth every other day.      . rivaroxaban (XARELTO) 20 MG TABS tablet Take 1 tablet (20 mg total) by mouth daily with supper. 90 tablet 3  . simvastatin (ZOCOR) 10 MG tablet Take 10 mg by mouth at bedtime.     No current facility-administered medications on file prior to visit.     Allergies  Allergen Reactions  . Shellfish Allergy     Past Medical History:  Diagnosis Date  . Atrial fibrillation ([place])   . Coronary artery disease    s/p remote CABG 1999 per Dr. Cyndia Bent;  Lexiscan Myoview (01/2014):   No ischemia, not gated, normal study  . Hypercholesterolemia   . Hypertension   . 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 CORONARY ARTERY BYPASS GRAFT  1999  . excision of skin cancer    . KNEE ARTHROSCOPY W/ MENISCAL REPAIR      History  Smoking Status  . Former Smoker  . Quit date: 09/29/1997  Smokeless Tobacco  . Not on file    History  Alcohol Use No    Family History  Problem Relation Age of Onset  . Lung disease Father 88    Review of Systems: The review of systems is per the HPI.  All other systems were reviewed and are negative.  Physical Exam: BP (!) 150/90 (BP Location: Left Arm, Patient Position: Sitting, Cuff Size: Normal)   Pulse 60   Ht 5\' 9"  (1.753 m)   Wt 239 lb 12.8 oz (108.8 kg)   BMI 35.41 kg/m   Patient is very pleasant, obese,  and in no acute distress. Skin is warm and dry. Color is normal.  HEENT is unremarkable. Normocephalic/atraumatic. PERRL. Sclera are nonicteric. Neck is supple. No masses. No JVD. Lungs are clear. Cardiac exam shows an irregular rate and rhythm. Normal S1-2. No  gallop or murmur. Abdomen is soft. Extremities are without edema. Gait and ROM are intact. No gross neurologic deficits noted.   LABORATORY DATA:   Lab Results  Component Value Date   WBC 5.5 01/23/2014   HGB 15.6 01/23/2014   HCT 46.1 01/23/2014   PLT 179.0 01/23/2014   GLUCOSE 95 01/23/2014   CHOL 125 01/23/2014   TRIG 71.0 01/23/2014   HDL 37.10 (L) 01/23/2014   LDLCALC 74 01/23/2014   ALT 47 01/23/2014   AST 47 (H) 01/23/2014   NA 139 01/23/2014   K 4.6 01/23/2014   CL 105 01/23/2014   CREATININE 0.8 01/23/2014   BUN 16 01/23/2014   CO2 27 01/23/2014   TSH 3.48 04/08/2013   Labs from 06/19/16: Normal CBC. Cholesterol 131, triglycerides 78, LDL 70, HDL 45. A1c 5.3%.     Myoview 04/11/16: Study Highlights    Nuclear stress EF: 54%. The LV systolic function is normal  There  was no ST segment deviation noted during stress.  The study is normal. no ischemia. No infarction  This is a low risk study.       Assessment / Plan: 1. CAD - remote CABG in 1999 - normal Myoview in July 2017. Patient has class 1-2 symptoms. Continue risk factor modification. Continue metoprolol. We discussed adding additional antianginal therapy such as a long acting nitrate but he would like to defer additional medication at this time. If anginal symptoms progress I would have a low threshold for pursuing cardiac cath since he is 18 yrs out from CABG.  2. Atrial fibrillation - permanent. Rate control is excellent on metoprolol. Patient is minimally symptomatic. Continue anticoagulation with Xarelto.  3. HTN -  Usually well controlled.   4. HLD - on statin.controlled.  5. Obesity.encouraged weight loss   Plan I will followup again in 6 months.

## 2016-09-25 ENCOUNTER — Ambulatory Visit (INDEPENDENT_AMBULATORY_CARE_PROVIDER_SITE_OTHER): Payer: PPO | Admitting: Cardiology

## 2016-09-25 ENCOUNTER — Encounter: Payer: Self-pay | Admitting: Cardiology

## 2016-09-25 VITALS — BP 150/90 | HR 60 | Ht 69.0 in | Wt 239.8 lb

## 2016-09-25 DIAGNOSIS — I25708 Atherosclerosis of coronary artery bypass graft(s), unspecified, with other forms of angina pectoris: Secondary | ICD-10-CM | POA: Diagnosis not present

## 2016-09-25 DIAGNOSIS — E7800 Pure hypercholesterolemia, unspecified: Secondary | ICD-10-CM | POA: Diagnosis not present

## 2016-09-25 DIAGNOSIS — I482 Chronic atrial fibrillation, unspecified: Secondary | ICD-10-CM

## 2016-09-25 DIAGNOSIS — I10 Essential (primary) hypertension: Secondary | ICD-10-CM

## 2016-09-25 NOTE — Patient Instructions (Signed)
Increase your aerobic activity and try and lose weight  Continue your current therapy  Let me know if your chest symptoms worsen  I will see you in 6 months.

## 2016-10-22 DIAGNOSIS — R7303 Prediabetes: Secondary | ICD-10-CM | POA: Diagnosis not present

## 2016-10-22 DIAGNOSIS — E782 Mixed hyperlipidemia: Secondary | ICD-10-CM | POA: Diagnosis not present

## 2016-10-22 DIAGNOSIS — I10 Essential (primary) hypertension: Secondary | ICD-10-CM | POA: Diagnosis not present

## 2016-10-22 DIAGNOSIS — I4891 Unspecified atrial fibrillation: Secondary | ICD-10-CM | POA: Diagnosis not present

## 2016-10-22 DIAGNOSIS — I2510 Atherosclerotic heart disease of native coronary artery without angina pectoris: Secondary | ICD-10-CM | POA: Diagnosis not present

## 2016-10-22 DIAGNOSIS — E663 Overweight: Secondary | ICD-10-CM | POA: Diagnosis not present

## 2016-10-22 DIAGNOSIS — I25111 Atherosclerotic heart disease of native coronary artery with angina pectoris with documented spasm: Secondary | ICD-10-CM | POA: Diagnosis not present

## 2017-02-18 DIAGNOSIS — I4891 Unspecified atrial fibrillation: Secondary | ICD-10-CM | POA: Diagnosis not present

## 2017-02-18 DIAGNOSIS — R7303 Prediabetes: Secondary | ICD-10-CM | POA: Diagnosis not present

## 2017-02-18 DIAGNOSIS — E782 Mixed hyperlipidemia: Secondary | ICD-10-CM | POA: Diagnosis not present

## 2017-02-18 DIAGNOSIS — I10 Essential (primary) hypertension: Secondary | ICD-10-CM | POA: Diagnosis not present

## 2017-02-18 DIAGNOSIS — I2510 Atherosclerotic heart disease of native coronary artery without angina pectoris: Secondary | ICD-10-CM | POA: Diagnosis not present

## 2017-03-18 NOTE — Progress Notes (Signed)
Ryan Mccall Date of Birth: January 19, 1946 Medical Record #[id number]  History of Present Illness: Ryan Mccall is seen back today for follow up of CAD. He has known CAD, HTN and HLD. He had remote CABG per Dr. Cyndia Bent in 1999 with LIMA to LAD, SVG to 1st DX and SVG to intermediate and OM of the LCX. He did have transient atrial fibrillation during a stress test in 2009. His last stress Myoview in July 2017showed normal perfusion. He was diagnosed with persistent atrial fibrillation in July 2014. Treated with rate control and anticoagulation.  Echocardiogram showed normal LV function with mild mitral insufficiency and moderate left atrial enlargement.   On follow up today he reports he is doing well. He does get chest tightness and discomfort when walking uphill strenuously or pushing a mower. It resolves with stopping to rest. Has not used Ntg. Symptoms similar to last visit. Does not monitor his BP.   Current Outpatient Prescriptions on File Prior to Visit  Medication Sig Dispense Refill  . COD LIVER OIL PO Take 1 capsule by mouth daily.     Marland Kitchen ezetimibe (ZETIA) 10 MG tablet Take 10 mg by mouth daily.    . fexofenadine (ALLEGRA) 180 MG tablet Take 180 mg by mouth daily.      . fish oil-omega-3 fatty acids 1000 MG capsule Take 1 g by mouth daily.     . metoprolol (LOPRESSOR) 50 MG tablet Take 1.5 tablets (75 mg total) by mouth 2 (two) times daily. 270 tablet 3  . Multiple Vitamin (MULTIVITAMIN) tablet Take 1 tablet by mouth every other day.      . rivaroxaban (XARELTO) 20 MG TABS tablet Take 1 tablet (20 mg total) by mouth daily with supper. 90 tablet 3  . simvastatin (ZOCOR) 10 MG tablet Take 10 mg by mouth at bedtime.     No current facility-administered medications on file prior to visit.     Allergies  Allergen Reactions  . Shellfish Allergy     Past Medical History:  Diagnosis Date  . Atrial fibrillation ([place])   . Coronary artery disease    s/p remote CABG 1999 per Dr. Cyndia Bent;   Lexiscan Myoview (01/2014):  No ischemia, not gated, normal study  . Hypercholesterolemia   . Hypertension   . 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 CORONARY ARTERY BYPASS GRAFT  1999  . excision of skin cancer    . KNEE ARTHROSCOPY W/ MENISCAL REPAIR      History  Smoking Status  . Never Smoker  Smokeless Tobacco  . Former Systems developer  . Types: Chew    History  Alcohol Use No    Family History  Problem Relation Age of Onset  . Lung disease Father 55    Review of Systems: The review of systems is per the HPI.  All other systems were reviewed and are negative.  Physical Exam: BP (!) 170/95 (BP Location: Right Arm)   Pulse 68   Ht 5\' 9"  (1.753 m)   Wt 238 lb 6.4 oz (108.1 kg)   BMI 35.21 kg/m   Patient is very pleasant, obese,  and in no acute distress. Skin is warm and dry. Color is normal.  HEENT is unremarkable. Normocephalic/atraumatic. PERRL. Sclera are nonicteric. Neck is supple. No masses. No JVD. Lungs are clear. Cardiac exam shows an irregular rate and rhythm. Normal S1-2. No gallop or murmur. Abdomen is  soft. Extremities are without edema. Gait and ROM are intact. No gross neurologic deficits noted.   LABORATORY DATA:   Lab Results  Component Value Date   WBC 5.5 01/23/2014   HGB 15.6 01/23/2014   HCT 46.1 01/23/2014   PLT 179.0 01/23/2014   GLUCOSE 95 01/23/2014   CHOL 125 01/23/2014   TRIG 71.0 01/23/2014   HDL 37.10 (L) 01/23/2014   LDLCALC 74 01/23/2014   ALT 47 01/23/2014   AST 47 (H) 01/23/2014   NA 139 01/23/2014   K 4.6 01/23/2014   CL 105 01/23/2014   CREATININE 0.8 01/23/2014   BUN 16 01/23/2014   CO2 27 01/23/2014   TSH 3.48 04/08/2013   Labs from 02/18/17:  CBC with platelet count 130K. Marland Kitchen Cholesterol 127, triglycerides 79, LDL 73, HDL 38. A1c 5.6%. CMET normal.    Myoview 04/11/16: Study Highlights    Nuclear stress EF: 54%. The LV systolic function is  normal  There was no ST segment deviation noted during stress.  The study is normal. no ischemia. No infarction  This is a low risk study.     Ecg today shows Afib with rate 68. Otherwise normal. I have personally reviewed and interpreted this study.   Assessment / Plan: 1. CAD - remote CABG in 1999 - normal Myoview in July 2017. Patient has class 1-2 symptoms- stable. Continue risk factor modification. Continue metoprolol.  If anginal symptoms progress I would have a low threshold for pursuing cardiac cath since he is 18 yrs out from CABG. We will work on improving his BP control and this should help his angina.  2. Atrial fibrillation - permanent. Rate control is excellent on metoprolol. Patient is minimally symptomatic. Continue anticoagulation with Xarelto.  3. HTN -  Poorly controlled. Will add losartan 50 mg daily. Will repeat BMET and BP check in 3 weeks. Patient to get a home BP monitor. Avoid salt.   4. HLD - on statin.controlled.  5. Obesity.encouraged weight loss   Plan I will followup again in 6 months.

## 2017-03-20 ENCOUNTER — Encounter: Payer: Self-pay | Admitting: Cardiology

## 2017-03-20 ENCOUNTER — Ambulatory Visit (INDEPENDENT_AMBULATORY_CARE_PROVIDER_SITE_OTHER): Payer: PPO | Admitting: Cardiology

## 2017-03-20 VITALS — BP 170/95 | HR 68 | Ht 69.0 in | Wt 238.4 lb

## 2017-03-20 DIAGNOSIS — E7800 Pure hypercholesterolemia, unspecified: Secondary | ICD-10-CM | POA: Diagnosis not present

## 2017-03-20 DIAGNOSIS — E669 Obesity, unspecified: Secondary | ICD-10-CM

## 2017-03-20 DIAGNOSIS — I482 Chronic atrial fibrillation, unspecified: Secondary | ICD-10-CM

## 2017-03-20 DIAGNOSIS — I10 Essential (primary) hypertension: Secondary | ICD-10-CM

## 2017-03-20 DIAGNOSIS — I25708 Atherosclerosis of coronary artery bypass graft(s), unspecified, with other forms of angina pectoris: Secondary | ICD-10-CM

## 2017-03-20 MED ORDER — LOSARTAN POTASSIUM 50 MG PO TABS: 50.0000 mg | ORAL_TABLET | Freq: Every day | ORAL | 11 refills | Status: DC

## 2017-03-20 NOTE — Patient Instructions (Addendum)
Continue your current therapy  We will add losartan 50 mg daily  We will check your blood pressure and a blood test in 3 weeks.   I will see you in 6 months

## 2017-03-23 ENCOUNTER — Telehealth: Payer: Self-pay | Admitting: Cardiology

## 2017-03-23 NOTE — Telephone Encounter (Signed)
Called the patient and left a VM to call back and schedule nurse visit appointment for a 3 week BP check.

## 2017-03-23 NOTE — Telephone Encounter (Signed)
Called the patient and left a VM to call me back.

## 2017-03-24 ENCOUNTER — Other Ambulatory Visit: Payer: Self-pay

## 2017-03-24 DIAGNOSIS — I10 Essential (primary) hypertension: Secondary | ICD-10-CM

## 2017-04-09 ENCOUNTER — Telehealth: Payer: Self-pay | Admitting: Cardiology

## 2017-04-09 NOTE — Telephone Encounter (Signed)
Patient calling the office for samples of medication:   1.  What medication and dosage are you requesting samples for? Xarelto   2.  Are you currently out of this medication? Not quite

## 2017-04-09 NOTE — Telephone Encounter (Signed)
Xarelto 20 mg #4 lot #[id number] exp 12/20, patient aware

## 2017-04-10 ENCOUNTER — Ambulatory Visit: Payer: PPO | Admitting: *Deleted

## 2017-04-10 VITALS — BP 148/100 | HR 56

## 2017-04-10 DIAGNOSIS — E7800 Pure hypercholesterolemia, unspecified: Secondary | ICD-10-CM | POA: Diagnosis not present

## 2017-04-10 DIAGNOSIS — I25708 Atherosclerosis of coronary artery bypass graft(s), unspecified, with other forms of angina pectoris: Secondary | ICD-10-CM | POA: Diagnosis not present

## 2017-04-10 DIAGNOSIS — E669 Obesity, unspecified: Secondary | ICD-10-CM | POA: Diagnosis not present

## 2017-04-10 DIAGNOSIS — Z0130 Encounter for examination of blood pressure without abnormal findings: Secondary | ICD-10-CM

## 2017-04-10 DIAGNOSIS — I10 Essential (primary) hypertension: Secondary | ICD-10-CM | POA: Diagnosis not present

## 2017-04-10 DIAGNOSIS — I482 Chronic atrial fibrillation: Secondary | ICD-10-CM | POA: Diagnosis not present

## 2017-04-10 LAB — BASIC METABOLIC PANEL
BUN / CREAT RATIO: 21 (ref 10–24)
BUN: 18 mg/dL (ref 8–27)
CO2: 26 mmol/L (ref 20–29)
CREATININE: 0.87 mg/dL (ref 0.76–1.27)
Calcium: 9.1 mg/dL (ref 8.6–10.2)
Chloride: 104 mmol/L (ref 96–106)
GFR calc Af Amer: 101 mL/min/{1.73_m2} (ref >59)
GFR calc non Af Amer: 87 mL/min/{1.73_m2} (ref >59)
GLUCOSE: 99 mg/dL (ref 65–99)
Potassium: 4.8 mmol/L (ref 3.5–5.2)
Sodium: 143 mmol/L (ref 134–144)

## 2017-04-10 NOTE — Progress Notes (Signed)
1.) Reason for visit: blood pressure check after starting losartan 50 mg daily  2.) Name of MD requesting visit:Dr [name]  3.) H&P: patient states he has taken morning medications every morning around 3 am  , but today a little later due to not having to get up at 3 am to go to work.  Patient and wife has kept a blood pressure log since last office visit.  Range 106-166/73-107. The log placed in Dr Doug Sou box for review .  RN  Informed patient to continue taking current medications until further notice. RN took patient to lab for labwork. Patient aware Dr [name] is not in office and will defer for further instructions  4.) ROS related to problem:  Blood pressure 138/100 right,  148/100 left    Pulse 56 irregular

## 2017-04-10 NOTE — Patient Instructions (Signed)
No changes continue with current medications until further instructions

## 2017-04-13 ENCOUNTER — Other Ambulatory Visit: Payer: Self-pay

## 2017-04-13 MED ORDER — LOSARTAN POTASSIUM 100 MG PO TABS: 100.0000 mg | ORAL_TABLET | Freq: Every day | ORAL | 6 refills | Status: DC

## 2017-04-14 ENCOUNTER — Telehealth: Payer: Self-pay | Admitting: Cardiology

## 2017-04-14 NOTE — Telephone Encounter (Signed)
Called patient and LVM to call me back to scheduled HTN clinic appointment in a couple of weeks.

## 2017-04-17 ENCOUNTER — Telehealth: Payer: Self-pay | Admitting: Cardiology

## 2017-04-17 NOTE — Telephone Encounter (Signed)
Called the patient and left a detailed message to call me back to schedule his lipid appointment.

## 2017-04-22 ENCOUNTER — Telehealth: Payer: Self-pay | Admitting: Cardiology

## 2017-04-27 ENCOUNTER — Ambulatory Visit (INDEPENDENT_AMBULATORY_CARE_PROVIDER_SITE_OTHER): Payer: PPO | Admitting: Pharmacist

## 2017-04-27 VITALS — BP 136/86 | HR 74

## 2017-04-27 DIAGNOSIS — I10 Essential (primary) hypertension: Secondary | ICD-10-CM

## 2017-04-27 MED ORDER — IRBESARTAN 300 MG PO TABS: 300.0000 mg | ORAL_TABLET | Freq: Every day | ORAL | 11 refills | Status: DC

## 2017-04-27 MED ORDER — CARVEDILOL 12.5 MG PO TABS: ORAL_TABLET | ORAL | 11 refills | Status: DC

## 2017-04-27 NOTE — Telephone Encounter (Signed)
FORWARD TO SUPPLE , MEGAN

## 2017-04-27 NOTE — Telephone Encounter (Signed)
Pt just seen in HTN clinic this afternoon. Returned call to pt - he wants to speak with Malachy Mood.

## 2017-04-27 NOTE — Telephone Encounter (Signed)
New message    Pt is calling asking for a call back about his bp medication. He said he had an appt with pharmacist today and wants to make sure the medication is alright for him.

## 2017-04-27 NOTE — Progress Notes (Signed)
Patient ID: LONG BRIMAGE                 DOB: March 01, 1946                      MRN: [id number]     HPI: Ryan Mccall is a 71 y.o. male referred by Dr. [name] to HTN clinic. PMH is significant for afib, CAD s/p CABG with LIMA to LAD, SVG to 1st DX and SVG to intermediate and OM of the LCX, HTN, HLD, and obesity. Losartan was titrated to 100mg  daily 2 weeks ago and pt presents today for HTN follow up.  Pt reports feeling well overall. He denies dizziness, blurred vision, headache, or falls. He has checked his BP at home a few times and states that his systolic usually runs 295-284. Pt has not had BMET checked since increasing dose of losartan. He is not very active but does try to limit sodium in his diet. Reports home HR usually run in the 50-60s which is lower than clinic reading today.  Current HTN meds: losartan 100mg  daily, metoprolol tartrate 75mg  BID BP goal: <130/19mmHg  Family History: Father with history of lung disease.  Social History: Used to chew tobacco. Denies alcohol and illicit drug use.  Diet: Breakfast - eggs and cheese or sausage sandwich. Dinner - nabs, salad. Diet soda, 2 coffees each day.   Exercise: Minimal, would like to start walking  Home BP readings: Checks at home - recalls readings of 150/100, 111/83  Wt Readings from Last 3 Encounters:  03/20/17 238 lb 6.4 oz (108.1 kg)  09/25/16 239 lb 12.8 oz (108.8 kg)  04/11/16 236 lb (107 kg)   BP Readings from Last 3 Encounters:  04/10/17 (!) 148/100  03/20/17 (!) 170/95  09/25/16 (!) 150/90   Pulse Readings from Last 3 Encounters:  04/10/17 (!) 56  03/20/17 68  09/25/16 60    Renal function: CrCl cannot be calculated (Unknown ideal weight.).  Past Medical History:  Diagnosis Date  . Atrial fibrillation ([place])   . Coronary artery disease    s/p remote CABG 1999 per Dr. Cyndia Bent;  Lexiscan Myoview (01/2014):  No ischemia, not gated, normal study  . Hypercholesterolemia   . Hypertension   . 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 COD LIVER OIL PO Take 1 capsule by mouth daily.     Marland Kitchen ezetimibe (ZETIA) 10 MG tablet Take 10 mg by mouth daily.    . fexofenadine (ALLEGRA) 180 MG tablet Take 180 mg by mouth daily.      . fish oil-omega-3 fatty acids 1000 MG capsule Take 1 g by mouth daily.     Marland Kitchen losartan (COZAAR) 100 MG tablet Take 1 tablet (100 mg total) by mouth daily. 30 tablet 6  . metoprolol (LOPRESSOR) 50 MG tablet Take 1.5 tablets (75 mg total) by mouth 2 (two) times daily. 270 tablet 3  . Multiple Vitamin (MULTIVITAMIN) tablet Take 1 tablet by mouth every other day.      . rivaroxaban (XARELTO) 20 MG TABS tablet Take 1 tablet (20 mg total) by mouth daily with supper. 90 tablet 3  . simvastatin (ZOCOR) 10 MG tablet Take 10 mg by mouth at bedtime.     No current facility-administered medications on file prior to visit.     Allergies  Allergen Reactions  .  Shellfish Allergy      Assessment/Plan:  1. Hypertension - BP slightly above goal <130/29mmHg. Discussed starting new medication vs optimizing pt's current antihypertensive regimen. Pt would like to avoid adding to the total number of medications he takes at this time. Will switch losartan 100mg  daily to irbesartan 300mg  daily for better BP lowering. Will also switch metoprolol 75mg  BID to equivalent dose carvedilol 18.75mg  BID (home HR reported in the 50-60s) for better BP lowering. Will check BMET today with recent ARB titration 2 weeks ago. Pt was encouraged to start walking for 15-20 minutes per day. F/u in HTN clinic in 4 weeks.   Wolf Boulay E. Supple, PharmD, CPP, [facility] [address] [place],  [address] Phone: [phone]; Fax: [phone] 04/27/2017 4:34 PM

## 2017-04-27 NOTE — Patient Instructions (Addendum)
Blood pressure goal is less than 130/80  Start walking for 15 minutes most days a week  Stop taking losartan and start taking irbesartan 300mg  daily  Stop taking metoprolol and start taking carvedilol 12.5mg  tablets - take 1.5 tablets twice a day  Follow up in clinic in 4 weeks

## 2017-04-28 LAB — BASIC METABOLIC PANEL
BUN / CREAT RATIO: 16 (ref 10–24)
BUN: 18 mg/dL (ref 8–27)
CALCIUM: 9.1 mg/dL (ref 8.6–10.2)
CHLORIDE: 106 mmol/L (ref 96–106)
CO2: 24 mmol/L (ref 20–29)
CREATININE: 1.11 mg/dL (ref 0.76–1.27)
GFR calc Af Amer: 77 mL/min/{1.73_m2} (ref >59)
GFR calc non Af Amer: 67 mL/min/{1.73_m2} (ref >59)
Glucose: 91 mg/dL (ref 65–99)
Potassium: 4 mmol/L (ref 3.5–5.2)
SODIUM: 143 mmol/L (ref 134–144)

## 2017-04-28 NOTE — Telephone Encounter (Signed)
Returned call to patient.He wanted to let Dr.Jordan know he saw pharmacist in HTN clinic yesterday.Stated she changed a couple of his medications.Advised ok to take medications as ordered.Keep appointment as planned.Advised to bring diary of B/P readings to appointment.

## 2017-05-11 ENCOUNTER — Telehealth: Payer: Self-pay | Admitting: Cardiology

## 2017-05-11 NOTE — Telephone Encounter (Signed)
I don't think his medication is causing swelling. Very important to limit salt intake and keep feet elevated when possible. Compression hose are a good idea. If swelling gets worse let me know.  Ryan Calica [name] MD, [facility]

## 2017-05-11 NOTE — Telephone Encounter (Signed)
Spoke with pt wife, the patient started noticing swelling in his feet and legs. He wears high top boots and his legs were swollen above his boots. They were very swollen last night, he wore compression socks and elevated his feet and they were down some this morning but not back to normal. He denies SOB. His bp has been 158/89 and 156/94 since legs have been swollen. He is currently taking irbesartan 300 mg daily and carvedilol 18.75 mg bid. These are the most recent changes. Will forward to dr [name] to review and advise

## 2017-05-11 NOTE — Telephone Encounter (Signed)
New message  Pt call requesting to speak with RN about his left leg and foot swelling on 8/12. Pt states he just had a med change and would like to speak with Rn to discuss

## 2017-05-12 NOTE — Telephone Encounter (Signed)
Returned call to patient Dr.Jordan's recommendations given.Advised to keep appointments as planned and call sooner if swelling continues.

## 2017-05-25 ENCOUNTER — Ambulatory Visit (INDEPENDENT_AMBULATORY_CARE_PROVIDER_SITE_OTHER): Payer: PPO | Admitting: Pharmacist

## 2017-05-25 VITALS — BP 152/92 | HR 72

## 2017-05-25 DIAGNOSIS — I10 Essential (primary) hypertension: Secondary | ICD-10-CM

## 2017-05-25 MED ORDER — CARVEDILOL 12.5 MG PO TABS: ORAL_TABLET | ORAL | 3 refills | Status: DC

## 2017-05-25 MED ORDER — IRBESARTAN 300 MG PO TABS: 300.0000 mg | ORAL_TABLET | Freq: Every day | ORAL | 3 refills | Status: DC

## 2017-05-25 MED ORDER — CHLORTHALIDONE 25 MG PO TABS: 25.0000 mg | ORAL_TABLET | Freq: Every day | ORAL | 11 refills | Status: DC

## 2017-05-25 NOTE — Patient Instructions (Signed)
Continue taking your irbesartan 300mg  once daily and carvedilol 18.75mg  twice daily.  Start taking chlorthalidone 25mg  once daily in the morning.  Please come back to clinic in 1 week on 06/02/17 for lab work - you do NOT need to fast for the labs.  Please follow-up in clinic in 2 weeks on 06/08/17 for blood pressure check. Please bring your home readings with you.

## 2017-05-25 NOTE — Progress Notes (Signed)
Patient ID: Ryan Mccall                 DOB: December 21, 1945                      MRN: [id number]     HPI: Ryan Mccall is a 71 y.o. male referred by Dr. [name] to HTN clinic. PMH is significant for afib, CAD s/p CABG with LIMA to LAD, SVG to 1st DX and SVG to intermediate and OM of the LCX, HTN, HLD, and obesity. At his HTN clinic visit on 7/30, BP was slightly above goal. Losartan 100 mg daily was switched to irbesartan 300 mg daily, and metoprolol tartrate 75 mg BID was switched to carvedilol 18.75 mg BID for better BP lowering effects. BMET done on the same day was WNL. Pt called in on 8/13 to report leg swelling and BP has been 150s/90s since legs have been swollen, thought it could have been due to the recent medication changes. Dr. [name] advised pt to limit salt intake and keep feet elevated and follow up in clinic as scheduled.   Pt denies any adverse events with new antihypertensives. Denies dizziness, blurred vision, falls, or headache. Pt has BP log from home with most BP readings ranging from 140-160s/80-90s and HR readings primarily ranging 50-60s. Pt reports that prior LE swelling resolved with elevation and cessation of soft drinks.   Current HTN meds: irbesartan 300 mg daily, carvedilol 18.75 mg BID  BP goal: <130/80 mmHg  Family History: Father with history of lung disease.  Social History: Used to chew tobacco. Denies alcohol and illicit drug use.  Diet:  Breakfast - eggs and cheese or sausage sandwich. Dinner - nabs, salad. Diet soda, 2 coffees each day.   Exercise: Minimal, would like to start walking  Wt Readings from Last 3 Encounters:  03/20/17 238 lb 6.4 oz (108.1 kg)  09/25/16 239 lb 12.8 oz (108.8 kg)  04/11/16 236 lb (107 kg)   BP Readings from Last 3 Encounters:  04/27/17 136/86  04/10/17 (!) 148/100  03/20/17 (!) 170/95   Pulse Readings from Last 3 Encounters:  04/27/17 74  04/10/17 (!) 56  03/20/17 68    Renal function: CrCl cannot be calculated  (Patient's most recent lab result is older than the maximum 21 days allowed.).  Past Medical History:  Diagnosis Date  . Atrial fibrillation ([place])   . Coronary artery disease    s/p remote CABG 1999 per Dr. Cyndia Bent;  Lexiscan Myoview (01/2014):  No ischemia, not gated, normal study  . Hypercholesterolemia   . Hypertension   . Skin cancer of trunk    abdominal wall squamous cell    Current Outpatient Prescriptions on File Prior to Visit  Medication Sig Dispense Refill  . carvedilol (COREG) 12.5 MG tablet Take 1.5 tablets by mouth twice a day 90 tablet 11  . COD LIVER OIL PO Take 1 capsule by mouth daily.     Marland Kitchen ezetimibe (ZETIA) 10 MG tablet Take 10 mg by mouth daily.    . fexofenadine (ALLEGRA) 180 MG tablet Take 180 mg by mouth daily.      . fish oil-omega-3 fatty acids 1000 MG capsule Take 1 g by mouth daily.     . irbesartan (AVAPRO) 300 MG tablet Take 1 tablet (300 mg total) by mouth daily. 30 tablet 11  . Multiple Vitamin (MULTIVITAMIN) tablet Take 1 tablet by mouth every other day.      Marland Kitchen  rivaroxaban (XARELTO) 20 MG TABS tablet Take 1 tablet (20 mg total) by mouth daily with supper. 90 tablet 3  . simvastatin (ZOCOR) 10 MG tablet Take 10 mg by mouth at bedtime.     No current facility-administered medications on file prior to visit.     Allergies  Allergen Reactions  . Shellfish Allergy      Assessment/Plan:  1. Hypertension: Currently uncontrolled with BP above goal of <130/80 mmHg in clinic today. Pt has been compliant with new antihypertensive regimen and has experienced no AE. BP readings at home appear somewhat controlled but labile. Heart rate in clinic is WNL at 72 bpm, however per patient's BP log from home he has frequent HR's in the 50s. Given borderline HR will initiate chlorthalidone 25mg  once daily rather than titrating carvedilol further. Pt will follow-up for a BMET in 1 week and return to clinic for a BP reading in 2 weeks.  Patient seen with Arrie Senate,  PharmD, PGY2 resident  Joffre Supple, PharmD, CPP, [facility] [address] [place], [place] [address] Phone: [phone]; Fax: [phone] 05/25/2017 4:25 PM

## 2017-06-02 ENCOUNTER — Other Ambulatory Visit: Payer: PPO | Admitting: *Deleted

## 2017-06-02 DIAGNOSIS — I10 Essential (primary) hypertension: Secondary | ICD-10-CM | POA: Diagnosis not present

## 2017-06-03 ENCOUNTER — Telehealth: Payer: Self-pay | Admitting: Pharmacist

## 2017-06-03 DIAGNOSIS — I10 Essential (primary) hypertension: Secondary | ICD-10-CM

## 2017-06-03 LAB — BASIC METABOLIC PANEL
BUN/Creatinine Ratio: 29 — ABNORMAL HIGH (ref 10–24)
BUN: 40 mg/dL — ABNORMAL HIGH (ref 8–27)
CALCIUM: 9.9 mg/dL (ref 8.6–10.2)
CO2: 24 mmol/L (ref 20–29)
CREATININE: 1.37 mg/dL — AB (ref 0.76–1.27)
Chloride: 102 mmol/L (ref 96–106)
GFR, EST AFRICAN AMERICAN: 60 mL/min/{1.73_m2} (ref >59)
GFR, EST NON AFRICAN AMERICAN: 52 mL/min/{1.73_m2} — AB (ref >59)
Glucose: 103 mg/dL — ABNORMAL HIGH (ref 65–99)
Potassium: 3.8 mmol/L (ref 3.5–5.2)
SODIUM: 143 mmol/L (ref 134–144)

## 2017-06-03 MED ORDER — SPIRONOLACTONE 25 MG PO TABS: 25.0000 mg | ORAL_TABLET | Freq: Every day | ORAL | 11 refills | Status: DC

## 2017-06-03 NOTE — Telephone Encounter (Signed)
BMET drawn yesterday shows increased SCr since starting chlorthalidone 1 week ago. Spoke with wife (pt hard of hearing and at work) who reports pt's BP readings and the swelling in his legs have improved since starting chlorthalidone. Was planning on switching to amlodipine to avoid renal issues, however with pt hx of LEE and good response to thiazide, will instead switch pt to spironolactone 25mg  daily for BP lowering and to help keep off fluid.  Pt will continue irbesartan and carvedilol at current doses. He will keep f/u BP appt on 9/10. Will recheck BMET at that time to see if SCr has improved with BP medication change. Advised pt to stay well hydrated over the next week as well.

## 2017-06-08 ENCOUNTER — Ambulatory Visit (INDEPENDENT_AMBULATORY_CARE_PROVIDER_SITE_OTHER): Payer: PPO | Admitting: Pharmacist

## 2017-06-08 ENCOUNTER — Other Ambulatory Visit: Payer: PPO | Admitting: *Deleted

## 2017-06-08 VITALS — BP 114/80 | HR 65

## 2017-06-08 DIAGNOSIS — I10 Essential (primary) hypertension: Secondary | ICD-10-CM

## 2017-06-08 NOTE — Patient Instructions (Addendum)
Continue to take irbesartan 300mg  (1 tablet) once daily and carvedilol 18.75mg  (1 and 1/2 tablets) twice daily.  Reduce the spironolactone to 12.5mg  (1/2 tablet) once daily.  Continue to monitor your blood pressure at home regularly.  Please follow-up in 2 weeks to recheck your blood pressure in clinic.  If top blood pressure reading is less than 105, please call clinic [phone].

## 2017-06-08 NOTE — Progress Notes (Signed)
Patient ID: Ryan Mccall                 DOB: [birth year]                      MRN: [id number]     HPI: Ryan Mccall is a 71 y.o. male referred by Dr. [name] to HTN clinic. PMH is significant for AFib, CAD s/p CABG, HTN, HLD, and obesity. At a recent HTN clinic visit on 7/30, BP was slightly above goal. Losartan 100mg  daily was switched to irbesartan 300mg  daily, and metoprolol tartrate 75mg  BID was switched to carvedilol 18.75mg  BID for better BP-lowering effects.   Pt presented to HTN clinic on 8/27 after reporting recent lower extremity swelling resolved by limiting Na intake and elevation. In clinic, pt's BP was elevated with a HR in the 50-60s, so chlorthalidone was initiated rather than titrating carvedilol further. Chlorthalidone was chosen over amlodipine given recent LEE. However, BMET drawn on 9/4 showed SCr increase 1.11>1.37, though pt's wife endorsed good BP response to chlorthalidone. Therefore, chlorthalidone was discontinued and spironolactone 25mg  daily was initiated.  Pt reports recent dizziness and nausea associated with low BP readings (low of 89/56). Pt reports adherence with new spironolactone prescription but did not take it yesterday due to low BP. Pt endorses recently reducing potato chip and caffeine intake, but no other acute changes.   Current HTN meds: irbesartan 300mg  daily, carvedilol 18.75mg  BID, spironolactone 25mg  daily Previously tried: chlorthalidone 25mg  daily - stopped due to increase in SCr BP goal: <130/80 mmHg  Family History: lung disease (father)  Social History: Used to chew tobacco, denies alcohol/illicit drug use  Diet: Breakfast - eggs and cheese or sausage sandwich Dinner - nabs, salad. Diet soda, 2 coffees daily  Exercise: Minimal, would like to start walking  Home BP readings:  9/9: 100/67, 89/56, 102/69, 132/79, 96/61  Wt Readings from Last 3 Encounters:  03/20/17 238 lb 6.4 oz (108.1 kg)  09/25/16 239 lb 12.8 oz (108.8 kg)  04/11/16  236 lb (107 kg)   BP Readings from Last 3 Encounters:  05/25/17 (!) 152/92  04/27/17 136/86  04/10/17 (!) 148/100   Pulse Readings from Last 3 Encounters:  05/25/17 72  04/27/17 74  04/10/17 (!) 56    Renal function: CrCl cannot be calculated (Unknown ideal weight.).  Past Medical History:  Diagnosis Date  . Atrial fibrillation ([place])   . Coronary artery disease    s/p remote CABG 1999 per Dr. Cyndia Bent;  Lexiscan Myoview (01/2014):  No ischemia, not gated, normal study  . Hypercholesterolemia   . Hypertension   . Skin cancer of trunk    abdominal wall squamous cell    Current Outpatient Prescriptions on File Prior to Visit  Medication Sig Dispense Refill  . carvedilol (COREG) 12.5 MG tablet Take 1.5 tablets by mouth twice a day 270 tablet 3  . COD LIVER OIL PO Take 1 capsule by mouth daily.     Marland Kitchen ezetimibe (ZETIA) 10 MG tablet Take 10 mg by mouth daily.    . fexofenadine (ALLEGRA) 180 MG tablet Take 180 mg by mouth daily.      . fish oil-omega-3 fatty acids 1000 MG capsule Take 1 g by mouth daily.     . irbesartan (AVAPRO) 300 MG tablet Take 1 tablet (300 mg total) by mouth daily. 90 tablet 3  . Multiple Vitamin (MULTIVITAMIN) tablet Take 1 tablet by mouth every other day.      Marland Kitchen  rivaroxaban (XARELTO) 20 MG TABS tablet Take 1 tablet (20 mg total) by mouth daily with supper. 90 tablet 3  . simvastatin (ZOCOR) 10 MG tablet Take 10 mg by mouth at bedtime.    Marland Kitchen spironolactone (ALDACTONE) 25 MG tablet Take 1 tablet (25 mg total) by mouth daily. 30 tablet 11   No current facility-administered medications on file prior to visit.     Allergies  Allergen Reactions  . Shellfish Allergy      Assessment/Plan:  Hypertension - Currently well-controlled and below goal of <130/80 mmHg both in clinic and on home BP log. Given recent low BP readings <100 and reported symptoms, will reduce spironolactone dose to 12.5mg  once daily. Will continue carvedilol and irbesartan at current doses.  Checking BMET today with recent change from chlorthalidone to spironolactone. Pt counseled to stay hydrated and monitor BP frequently. F/u in HTN clinic in 2 weeks.  Arrie Senate, PharmD PGY-2 Cardiology Pharmacy Resident Pager: [phone] 06/08/2017

## 2017-06-09 ENCOUNTER — Ambulatory Visit: Payer: PPO

## 2017-06-09 LAB — BASIC METABOLIC PANEL
BUN/Creatinine Ratio: 28 — ABNORMAL HIGH (ref 10–24)
BUN: 29 mg/dL — AB (ref 8–27)
CALCIUM: 9.3 mg/dL (ref 8.6–10.2)
CHLORIDE: 99 mmol/L (ref 96–106)
CO2: 25 mmol/L (ref 20–29)
Creatinine, Ser: 1.02 mg/dL (ref 0.76–1.27)
GFR calc non Af Amer: 74 mL/min/{1.73_m2} (ref >59)
GFR, EST AFRICAN AMERICAN: 86 mL/min/{1.73_m2} (ref >59)
Glucose: 86 mg/dL (ref 65–99)
Potassium: 4.1 mmol/L (ref 3.5–5.2)
Sodium: 139 mmol/L (ref 134–144)

## 2017-06-21 NOTE — Progress Notes (Signed)
Patient ID: Ryan Mccall                 DOB: 23-Feb-1946                      MRN: [id number]     HPI: Ryan Mccall is a 71 y.o. male referred by Dr. [name] to HTN clinic. PMH is significant for AFib, CAD s/p CABG, HTN, HLD, and obesity. At a recent HTN clinic visit on 7/30, BP was slightly above goal. Losartan 100mg  daily was switched to irbesartan 300mg  daily, and metoprolol tartrate 75mg  BID was switched to carvedilol 18.75mg  BID for better BP-lowering effects. Pt presented to HTN clinic on 8/27 after reporting recent lower extremity swelling resolved by limiting Na intake and elevation. In clinic, pt's BP was elevated with a HR in the 50-60s, so chlorthalidone was initiated rather than titrating carvedilol further. Chlorthalidone was chosen over amlodipine given recent LEE. However, BMET drawn on 9/4 showed SCr increase 1.11>1.37, though pt's wife endorsed good BP response to chlorthalidone. Therefore, chlorthalidone was discontinued and spironolactone 25mg  daily was initiated. After transition to spironolactone, pt endorsed low BP readings at home with associated dizziness and nausea, and BP in clinic was 114/80 so spironolactone dose was reduced from 25mg  daily to 12.5mg  daily. Pt presents today for follow up.  Repeat BMET has normalized after stopping chlorthalidone. Pt denies any nausea or dizziness since reducing dose of spironolactone. The only symptom the pt notes is mild fatigue, but he has begun taking spironolactone at night which has helped. Pt reports BP readings at home have been mostly controlled as noted below with only one low reading of 82/60 mmHg.  Current HTN meds: irbesartan 300mg  daily, carvedilol 18.75mg  BID, spironolactone 12.5mg  daily Previously tried: chlorthalidone 25mg  daily - stopped due to increase in SCr BP goal: <130/80 mmHg  Family History: lung disease (father)  Social History: Used to chew tobacco, denies alcohol/illicit drug use  Diet: Breakfast - scrambled  eggs with gravy and cheese, 1-2 cups of coffee; lunch - nabs; dinner - nabs, cereal bar  Exercise: yardwork weekly  Home BP readings: mostly within 120-130s/60-80s (past 3 days: 135/82 mmHg, 133/65 mmHg, 115/80 mmHg)  Wt Readings from Last 3 Encounters:  03/20/17 238 lb 6.4 oz (108.1 kg)  09/25/16 239 lb 12.8 oz (108.8 kg)  04/11/16 236 lb (107 kg)   BP Readings from Last 3 Encounters:  06/08/17 114/80  05/25/17 (!) 152/92  04/27/17 136/86   Pulse Readings from Last 3 Encounters:  06/08/17 65  05/25/17 72  04/27/17 74    Renal function: CrCl cannot be calculated (Unknown ideal weight.).  Past Medical History:  Diagnosis Date  . Atrial fibrillation ([place])   . Coronary artery disease    s/p remote CABG 1999 per Dr. Cyndia Bent;  Lexiscan Myoview (01/2014):  No ischemia, not gated, normal study  . Hypercholesterolemia   . Hypertension   . Skin cancer of trunk    abdominal wall squamous cell    Current Outpatient Prescriptions on File Prior to Visit  Medication Sig Dispense Refill  . carvedilol (COREG) 12.5 MG tablet Take 1.5 tablets by mouth twice a day 270 tablet 3  . COD LIVER OIL PO Take 1 capsule by mouth daily.     Marland Kitchen ezetimibe (ZETIA) 10 MG tablet Take 10 mg by mouth daily.    . fexofenadine (ALLEGRA) 180 MG tablet Take 180 mg by mouth daily.      . fish  oil-omega-3 fatty acids 1000 MG capsule Take 1 g by mouth daily.     . irbesartan (AVAPRO) 300 MG tablet Take 1 tablet (300 mg total) by mouth daily. 90 tablet 3  . Multiple Vitamin (MULTIVITAMIN) tablet Take 1 tablet by mouth every other day.      . rivaroxaban (XARELTO) 20 MG TABS tablet Take 1 tablet (20 mg total) by mouth daily with supper. 90 tablet 3  . simvastatin (ZOCOR) 10 MG tablet Take 10 mg by mouth at bedtime.    Marland Kitchen spironolactone (ALDACTONE) 25 MG tablet Take 0.5 tablets (12.5 mg total) by mouth daily. 30 tablet 11   No current facility-administered medications on file prior to visit.     Allergies    Allergen Reactions  . Shellfish Allergy      Assessment/Plan:  Hypertension - Currently well-controlled at goal <130/51mmHg and pt is not experiencing any further AEs since lowering the dose of spironolactone. Will continue spironolactone 12.5mg  daily, carvedilol 18.75mg  BID, and irbesartan 300mg  daily. Instructed pt to continue monitoring BP regularly. Pt will follow-up with Dr. [name] in December, but can follow-up with pharmacy clinic as needed.  Arrie Senate, PharmD PGY-2 Cardiology Pharmacy Resident Pager: [phone] 06/22/2017

## 2017-06-22 ENCOUNTER — Ambulatory Visit (INDEPENDENT_AMBULATORY_CARE_PROVIDER_SITE_OTHER): Payer: PPO | Admitting: Pharmacist

## 2017-06-22 VITALS — BP 124/82 | HR 75

## 2017-06-22 DIAGNOSIS — I10 Essential (primary) hypertension: Secondary | ICD-10-CM

## 2017-06-22 MED ORDER — SPIRONOLACTONE 25 MG PO TABS: 12.5000 mg | ORAL_TABLET | Freq: Every day | ORAL | 3 refills | Status: DC

## 2017-06-22 NOTE — Patient Instructions (Addendum)
It was great to see you today.  Continue taking your irbesartan 300mg  daily, carvedilol 18.75mg  twice daily, and spironolactone daily.  Follow-up with Dr. [name] in December.

## 2017-06-24 DIAGNOSIS — Z23 Encounter for immunization: Secondary | ICD-10-CM | POA: Diagnosis not present

## 2017-06-24 DIAGNOSIS — E782 Mixed hyperlipidemia: Secondary | ICD-10-CM | POA: Diagnosis not present

## 2017-06-24 DIAGNOSIS — I2510 Atherosclerotic heart disease of native coronary artery without angina pectoris: Secondary | ICD-10-CM | POA: Diagnosis not present

## 2017-06-24 DIAGNOSIS — R7303 Prediabetes: Secondary | ICD-10-CM | POA: Diagnosis not present

## 2017-06-24 DIAGNOSIS — I4891 Unspecified atrial fibrillation: Secondary | ICD-10-CM | POA: Diagnosis not present

## 2017-06-24 DIAGNOSIS — A770 Spotted fever due to Rickettsia rickettsii: Secondary | ICD-10-CM | POA: Diagnosis not present

## 2017-06-24 DIAGNOSIS — I10 Essential (primary) hypertension: Secondary | ICD-10-CM | POA: Diagnosis not present

## 2017-07-01 ENCOUNTER — Emergency Department: Payer: PPO

## 2017-07-01 ENCOUNTER — Encounter: Payer: Self-pay | Admitting: Emergency Medicine

## 2017-07-01 ENCOUNTER — Inpatient Hospital Stay
Admission: EM | Admit: 2017-07-01 | Discharge: 2017-07-14 | DRG: 330 | Disposition: A | Discharge status: Home Health | Payer: PPO | Attending: Surgery | Admitting: Surgery

## 2017-07-01 DIAGNOSIS — K567 Ileus, unspecified: Secondary | ICD-10-CM

## 2017-07-01 DIAGNOSIS — K5720 Diverticulitis of large intestine with perforation and abscess without bleeding: Secondary | ICD-10-CM | POA: Diagnosis present

## 2017-07-01 DIAGNOSIS — Z7901 Long term (current) use of anticoagulants: Secondary | ICD-10-CM

## 2017-07-01 DIAGNOSIS — Z79899 Other long term (current) drug therapy: Secondary | ICD-10-CM

## 2017-07-01 DIAGNOSIS — Z4659 Encounter for fitting and adjustment of other gastrointestinal appliance and device: Secondary | ICD-10-CM

## 2017-07-01 DIAGNOSIS — Z978 Presence of other specified devices: Secondary | ICD-10-CM

## 2017-07-01 DIAGNOSIS — K5780 Diverticulitis of intestine, part unspecified, with perforation and abscess without bleeding: Secondary | ICD-10-CM | POA: Diagnosis not present

## 2017-07-01 DIAGNOSIS — I4891 Unspecified atrial fibrillation: Secondary | ICD-10-CM | POA: Diagnosis present

## 2017-07-01 DIAGNOSIS — Z4682 Encounter for fitting and adjustment of non-vascular catheter: Secondary | ICD-10-CM | POA: Diagnosis not present

## 2017-07-01 DIAGNOSIS — E7800 Pure hypercholesterolemia, unspecified: Secondary | ICD-10-CM | POA: Diagnosis present

## 2017-07-01 DIAGNOSIS — Z87891 Personal history of nicotine dependence: Secondary | ICD-10-CM

## 2017-07-01 DIAGNOSIS — I10 Essential (primary) hypertension: Secondary | ICD-10-CM | POA: Diagnosis present

## 2017-07-01 DIAGNOSIS — R000 Tachycardia, unspecified: Secondary | ICD-10-CM | POA: Diagnosis not present

## 2017-07-01 DIAGNOSIS — Z85828 Personal history of other malignant neoplasm of skin: Secondary | ICD-10-CM

## 2017-07-01 DIAGNOSIS — E669 Obesity, unspecified: Secondary | ICD-10-CM | POA: Diagnosis present

## 2017-07-01 DIAGNOSIS — K5792 Diverticulitis of intestine, part unspecified, without perforation or abscess without bleeding: Secondary | ICD-10-CM | POA: Diagnosis not present

## 2017-07-01 DIAGNOSIS — N2889 Other specified disorders of kidney and ureter: Secondary | ICD-10-CM

## 2017-07-01 DIAGNOSIS — N281 Cyst of kidney, acquired: Secondary | ICD-10-CM | POA: Diagnosis not present

## 2017-07-01 DIAGNOSIS — I2510 Atherosclerotic heart disease of native coronary artery without angina pectoris: Secondary | ICD-10-CM | POA: Diagnosis present

## 2017-07-01 DIAGNOSIS — Z951 Presence of aortocoronary bypass graft: Secondary | ICD-10-CM

## 2017-07-01 DIAGNOSIS — J9811 Atelectasis: Secondary | ICD-10-CM | POA: Diagnosis not present

## 2017-07-01 DIAGNOSIS — Z6834 Body mass index (BMI) 34.0-34.9, adult: Secondary | ICD-10-CM | POA: Diagnosis not present

## 2017-07-01 DIAGNOSIS — R109 Unspecified abdominal pain: Secondary | ICD-10-CM | POA: Diagnosis present

## 2017-07-01 LAB — CBC
HCT: 42.6 % (ref 40.0–52.0)
Hemoglobin: 15.1 g/dL (ref 13.0–18.0)
MCH: 31.2 pg (ref 26.0–34.0)
MCHC: 35.3 g/dL (ref 32.0–36.0)
MCV: 88.3 fL (ref 80.0–100.0)
PLATELETS: 173 10*3/uL (ref 150–440)
RBC: 4.83 MIL/uL (ref 4.40–5.90)
RDW: 14.9 % — ABNORMAL HIGH (ref 11.5–14.5)
WBC: 16.4 10*3/uL — AB (ref 3.8–10.6)

## 2017-07-01 LAB — COMPREHENSIVE METABOLIC PANEL
ALT: 21 U/L (ref 17–63)
AST: 25 U/L (ref 15–41)
Albumin: 4.1 g/dL (ref 3.5–5.0)
Alkaline Phosphatase: 45 U/L (ref 38–126)
Anion gap: 8 (ref 5–15)
BUN: 27 mg/dL — ABNORMAL HIGH (ref 6–20)
CALCIUM: 9.3 mg/dL (ref 8.9–10.3)
CHLORIDE: 100 mmol/L — AB (ref 101–111)
CO2: 24 mmol/L (ref 22–32)
CREATININE: 0.9 mg/dL (ref 0.61–1.24)
Glucose, Bld: 138 mg/dL — ABNORMAL HIGH (ref 65–99)
Potassium: 4.5 mmol/L (ref 3.5–5.1)
Sodium: 132 mmol/L — ABNORMAL LOW (ref 135–145)
TOTAL PROTEIN: 8.2 g/dL — AB (ref 6.5–8.1)
Total Bilirubin: 3.1 mg/dL — ABNORMAL HIGH (ref 0.3–1.2)

## 2017-07-01 LAB — LIPASE, BLOOD: LIPASE: 15 U/L (ref 11–51)

## 2017-07-01 LAB — PROTIME-INR
INR: 1.99
PROTHROMBIN TIME: 22.4 s — AB (ref 11.4–15.2)

## 2017-07-01 LAB — APTT: APTT: 39 s — AB (ref 24–36)

## 2017-07-01 MED ORDER — PIPERACILLIN-TAZOBACTAM 3.375 G IVPB 30 MIN
3.3750 g | Freq: Once | INTRAVENOUS | Status: AC
  Administered 2017-07-01: 3.375 g via INTRAVENOUS

## 2017-07-01 MED ORDER — HYDROMORPHONE HCL 1 MG/ML IJ SOLN
1.0000 mg | Freq: Once | INTRAMUSCULAR | Status: AC
  Administered 2017-07-01: 1 mg via INTRAVENOUS

## 2017-07-01 MED ORDER — SODIUM CHLORIDE 0.9 % IV BOLUS (SEPSIS)
1000.0000 mL | Freq: Once | INTRAVENOUS | Status: AC
  Administered 2017-07-01: 1000 mL via INTRAVENOUS

## 2017-07-01 MED ORDER — ONDANSETRON HCL 4 MG/2ML IJ SOLN
4.0000 mg | Freq: Once | INTRAMUSCULAR | Status: AC
  Administered 2017-07-01: 4 mg via INTRAVENOUS
  Filled 2017-07-01: qty 2

## 2017-07-01 MED ORDER — PIPERACILLIN-TAZOBACTAM 3.375 G IVPB 30 MIN
INTRAVENOUS | Status: AC
  Filled 2017-07-01: qty 50

## 2017-07-01 MED ORDER — HYDROMORPHONE HCL 1 MG/ML IJ SOLN
1.0000 mg | Freq: Once | INTRAMUSCULAR | Status: AC
  Administered 2017-07-01: 1 mg via INTRAVENOUS
  Filled 2017-07-01: qty 1

## 2017-07-01 MED ORDER — HYDROMORPHONE HCL 1 MG/ML IJ SOLN
INTRAMUSCULAR | Status: AC
  Filled 2017-07-01: qty 1

## 2017-07-01 MED ORDER — IOPAMIDOL (ISOVUE-300) INJECTION 61%
15.0000 mL | INTRAVENOUS | Status: AC
  Administered 2017-07-01: 15 mL via ORAL

## 2017-07-01 NOTE — ED Triage Notes (Signed)
Brought in via family with abd pain which started yesterday   Pos nausea  Dry heaves  Also noticed some abd distention

## 2017-07-01 NOTE — ED Provider Notes (Signed)
[facility] Emergency Department Provider Note  ____________________________________________  Time seen: Approximately 6:26 PM  I have reviewed the triage vital signs and the nursing notes.   HISTORY  Chief Complaint Abdominal Pain    HPI Ryan Mccall is a 71 y.o. male the history of CAD, A. fib, hypertension, hypercholesterolemia and obesity, presenting with lower abdominal pain, constipation and diarrhea. The patient reports that several days ago he felt constipated so he tried milk of magnesia. He then developed lower abdominal pain that was constant and got progressively worse, associated with clear liquid stool. Positional changes make the pain worse. He has had nausea with one episode of vomiting. No fevers, chills, urinary symptoms, testicular or penile pain. No sick contacts, or travel outside the [place].   Past Medical History:  Diagnosis Date  . Atrial fibrillation ([place])   . Coronary artery disease    s/p remote CABG 1999 per Dr. Cyndia Bent;  Lexiscan Myoview (01/2014):  No ischemia, not gated, normal study  . Hypercholesterolemia   . Hypertension   . 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 Obesity (BMI 30-39.9) 10/12/2014  . Coronary artery disease   . Hypertension   . Hypercholesterolemia   . Atrial fibrillation [facility])     Past Surgical History:  Procedure Laterality Date  . CORONARY ARTERY BYPASS GRAFT  1999  . excision of skin cancer    . KNEE ARTHROSCOPY W/ MENISCAL REPAIR      Current Outpatient Rx  . Order #: [id number] Class: Normal  . Order #: [id number] Class: Historical Med  . Order #: [id number] Class: Historical Med  . Order #: [id number] Class: Historical Med  . Order #: [id number] Class: Historical Med  . Order #: [id number] Class: Normal  . Order #: [id number] Class: Historical Med  . Order #: [id number] Class: Historical Med  . Order #: [id number] Class: Normal   . Order #: [id number] Class: Historical Med  . Order #: [id number] Class: Normal    Allergies Shellfish allergy  Family History  Problem Relation Age of Onset  . Lung disease Father 42    Social History Social History  Substance Use Topics  . Smoking status: Never Smoker  . Smokeless tobacco: Former Systems developer    Types: Chew  . Alcohol use No    Review of Systems Constitutional: No fever/chills. Eyes: No visual changes. ENT: No sore throat. No congestion or rhinorrhea. Cardiovascular: Denies chest pain. Denies palpitations. Respiratory: Denies shortness of breath.  No cough. Gastrointestinal: Positive abdominal pain.  Positive nausea, positive vomiting.  Positive diarrhea.  Positive constipation. Genitourinary: Negative for dysuria. No penile or testicular, scrotal pain. Musculoskeletal: Negative for back pain. Skin: Negative for rash. Neurological: Negative for headaches. No focal numbness, tingling or weakness.     ____________________________________________   PHYSICAL EXAM:  VITAL SIGNS: ED Triage Vitals  Enc Vitals Group     BP 07/01/17 1606 130/78     Pulse Rate 07/01/17 1606 (!) 102     Resp 07/01/17 1606 20     Temp 07/01/17 1606 98.8 F (37.1 C)     Temp Source 07/01/17 1606 Oral     SpO2 07/01/17 1606 97 %     Weight 07/01/17 1604 236 lb (107 kg)     Height 07/01/17 1604 5\' 9"  (1.753 m)     Head Circumference --      Peak Flow --      Pain Score 07/01/17 1604  9     Pain Loc --      Pain Edu? --      Excl. in [place]? --     Constitutional: Alert and oriented. Well appearing and in no acute distress. Answers questions appropriately. Eyes: Conjunctivae are normal.  EOMI. No scleral icterus. Head: Atraumatic. Nose: No congestion/rhinnorhea. Mouth/Throat: Mucous membranes are moist.  Neck: No stridor.  Supple.  No JVD. No meningismus Cardiovascular: Normal rate, regular rhythm. No murmurs, rubs or gallops.  Respiratory: Normal respiratory effort.  No  accessory muscle use or retractions. Lungs CTAB.  No wheezes, rales or ronchi. Gastrointestinal: Soft, and nondistended.  The patient has a markedly distended abdomen and possibly a central umbilical hernia although there is some distention discrete hernia is difficult to discern. He does have diffuse tenderness to palpation over the midline above the umbilicus, and in the lower abdomen above the pubic symphysis bilaterally. No guarding or rebound.  No peritoneal signs. Musculoskeletal: No LE edema.  Neurologic:  A&Ox3.  Speech is clear.  Face and smile are symmetric.  EOMI.  Moves all extremities well. Skin:  Skin is warm, dry and intact. No rash noted. Psychiatric: Mood and affect are normal. Speech and behavior are normal.  Normal judgement  ____________________________________________   LABS (all labs ordered are listed, but only abnormal results are displayed)  Labs Reviewed  COMPREHENSIVE METABOLIC PANEL - Abnormal; Notable for the following:       Result Value   Sodium 132 (*)    Chloride 100 (*)    Glucose, Bld 138 (*)    BUN 27 (*)    Total Protein 8.2 (*)    Total Bilirubin 3.1 (*)    All other components within normal limits  CBC - Abnormal; Notable for the following:    WBC 16.4 (*)    RDW 14.9 (*)    All other components within normal limits  C DIFFICILE QUICK SCREEN W PCR REFLEX  GASTROINTESTINAL PANEL BY PCR, STOOL (REPLACES STOOL CULTURE)  LIPASE, BLOOD  URINALYSIS, COMPLETE (UACMP) WITH MICROSCOPIC   ____________________________________________  EKG  ED ECG REPORT I, Eula Listen, the attending physician, personally viewed and interpreted this ECG.   Date: 07/01/2017  EKG Time: 1837  Rate: 110  Rhythm: sinus tachycardia  Axis: normal  Intervals:none  ST&T Change: No STEMI  ____________________________________________  RADIOLOGY  Ct Abdomen Pelvis Wo Contrast  Result Date: 07/01/2017 CLINICAL DATA:  Abdominal pain and distention. EXAM: CT  ABDOMEN AND PELVIS WITHOUT CONTRAST TECHNIQUE: Multidetector CT imaging of the abdomen and pelvis was performed following the standard protocol without IV contrast. COMPARISON:  None. FINDINGS: Lower chest: No acute abnormality. Coronary artery atherosclerotic vascular calcifications. Prior CABG. Left lower lobe atelectasis. Hepatobiliary: No focal liver abnormality is seen. No gallstones, gallbladder wall thickening, or biliary dilatation. Pancreas: Mild fatty atrophy. No ductal dilatation or surrounding inflammatory changes. Spleen: Normal in size without focal abnormality. Adrenals/Urinary Tract: The adrenal glands are unremarkable. There is a 10 mm exophytic solid-appearing lesion arising from the upper pole of the right kidney. No renal calculi or hydronephrosis. The bladder is unremarkable. Stomach/Bowel: Extensive pericolonic inflammatory fat stranding and fluid surrounding an inflamed sigmoid diverticulum, consistent with diverticulitis. There are multiple foci free air. There is dilatation of the proximal small bowel which gradually transitions into nondilated small bowel in the left lower quadrant. The stomach is distended. The appendix is normal. Vascular/Lymphatic: Aortic atherosclerosis. No enlarged abdominal or pelvic lymph nodes. Reproductive: Prostate is unremarkable. Other: Small volume pneumoperitoneum.  No drainable fluid collection. Bilateral fat containing inguinal hernias. Musculoskeletal: No acute or significant osseous findings. Bilateral L5 pars defects with 13 mm anterolisthesis of L5 on S1. Degenerative changes of the thoracolumbar spine. IMPRESSION: 1. Acute, perforated sigmoid diverticulitis with small volume pneumoperitoneum. No drainable fluid collection. 2. Proximal small bowel dilatation, gradually transitioning into nondilated small bowel in the left lower quadrant, favored to represent reactive ileus. 3. 10 mm exophytic solid appearing lesion arising from the upper pole of the right  kidney. Recommend further evaluation with nonemergent renal ultrasound. 4.  Aortic atherosclerosis (ICD10-I70.0). 5. 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 Critical Value/emergent results were called by telephone at the time of interpretation on 07/01/2017 at 8:58 pm to Dr. Eula Listen , who verbally acknowledged these results. Electronically Signed   By: Titus Dubin M.D.   On: 07/01/2017 21:01    ____________________________________________   PROCEDURES  Procedure(s) performed: None  Procedures  Critical Care performed: No ____________________________________________   INITIAL IMPRESSION / ASSESSMENT AND PLAN / ED COURSE  Pertinent labs & imaging results that were available during my care of the patient were reviewed by me and considered in my medical decision making (see chart for details).  71 y.o. male presenting with several days of progressively worsening abdominal pain, watery diarrhea after constipation, nausea. Overall, patient does have a belly that is concerning for an acute intra-abdominal process, including partial small bowel obstruction, incarcerated hernia, gallbladder disease. He is mildly tachycardic here but afebrile. His laboratory studies show a bilirubin of 3.1 without other LFT abnormalities. On review of his past medical chart, he has not had abdominal surgery. At this time, I will initiate symptomatic treatment with pain medication, nausea and can and intravenous fluids. We will then get a CT of the abdomen for final disposition.  ----------------------------------------- 9:04 PM on 07/01/2017 -----------------------------------------  The pt's CT shows perforated sigmoid diverticulitis.  Dr. Rosana Hoes, [place], is aware and will admit the patient.  Zosyn and IVF have been ordered.    ____________________________________________  FINAL CLINICAL IMPRESSION(S) / ED DIAGNOSES  Final diagnoses:  Perforation of sigmoid colon due  to diverticulitis         NEW MEDICATIONS STARTED DURING THIS VISIT:  New Prescriptions   No medications on file      Eula Listen, MD 07/01/17 2104

## 2017-07-02 ENCOUNTER — Inpatient Hospital Stay: Payer: PPO

## 2017-07-02 DIAGNOSIS — K567 Ileus, unspecified: Secondary | ICD-10-CM | POA: Diagnosis present

## 2017-07-02 DIAGNOSIS — Z79899 Other long term (current) drug therapy: Secondary | ICD-10-CM | POA: Diagnosis not present

## 2017-07-02 DIAGNOSIS — E7800 Pure hypercholesterolemia, unspecified: Secondary | ICD-10-CM | POA: Diagnosis present

## 2017-07-02 DIAGNOSIS — Z87891 Personal history of nicotine dependence: Secondary | ICD-10-CM | POA: Diagnosis not present

## 2017-07-02 DIAGNOSIS — K5720 Diverticulitis of large intestine with perforation and abscess without bleeding: Secondary | ICD-10-CM | POA: Diagnosis present

## 2017-07-02 DIAGNOSIS — Z85828 Personal history of other malignant neoplasm of skin: Secondary | ICD-10-CM | POA: Diagnosis not present

## 2017-07-02 DIAGNOSIS — Z951 Presence of aortocoronary bypass graft: Secondary | ICD-10-CM | POA: Diagnosis not present

## 2017-07-02 DIAGNOSIS — Z7901 Long term (current) use of anticoagulants: Secondary | ICD-10-CM | POA: Diagnosis not present

## 2017-07-02 DIAGNOSIS — R109 Unspecified abdominal pain: Secondary | ICD-10-CM | POA: Diagnosis present

## 2017-07-02 DIAGNOSIS — E669 Obesity, unspecified: Secondary | ICD-10-CM | POA: Diagnosis present

## 2017-07-02 DIAGNOSIS — Z6834 Body mass index (BMI) 34.0-34.9, adult: Secondary | ICD-10-CM | POA: Diagnosis not present

## 2017-07-02 DIAGNOSIS — I2510 Atherosclerotic heart disease of native coronary artery without angina pectoris: Secondary | ICD-10-CM | POA: Diagnosis present

## 2017-07-02 DIAGNOSIS — I4891 Unspecified atrial fibrillation: Secondary | ICD-10-CM | POA: Diagnosis present

## 2017-07-02 DIAGNOSIS — I10 Essential (primary) hypertension: Secondary | ICD-10-CM | POA: Diagnosis present

## 2017-07-02 HISTORY — DX: Diverticulitis of large intestine with perforation and abscess without bleeding: K57.20

## 2017-07-02 LAB — BASIC METABOLIC PANEL
ANION GAP: 7 (ref 5–15)
BUN: 28 mg/dL — ABNORMAL HIGH (ref 6–20)
CHLORIDE: 99 mmol/L — AB (ref 101–111)
CO2: 26 mmol/L (ref 22–32)
Calcium: 8.3 mg/dL — ABNORMAL LOW (ref 8.9–10.3)
Creatinine, Ser: 0.88 mg/dL (ref 0.61–1.24)
GFR calc Af Amer: >60 mL/min (ref >60)
GLUCOSE: 143 mg/dL — AB (ref 65–99)
POTASSIUM: 4.1 mmol/L (ref 3.5–5.1)
Sodium: 132 mmol/L — ABNORMAL LOW (ref 135–145)

## 2017-07-02 LAB — CBC WITH DIFFERENTIAL/PLATELET
BASOS ABS: 0 10*3/uL (ref 0–0.1)
Basophils Relative: 0 %
Eosinophils Absolute: 0 10*3/uL (ref 0–0.7)
Eosinophils Relative: 0 %
HEMATOCRIT: 38.1 % — AB (ref 40.0–52.0)
Hemoglobin: 13.1 g/dL (ref 13.0–18.0)
LYMPHS ABS: 0.8 10*3/uL — AB (ref 1.0–3.6)
LYMPHS PCT: 6 %
MCH: 30.2 pg (ref 26.0–34.0)
MCHC: 34.5 g/dL (ref 32.0–36.0)
MCV: 87.5 fL (ref 80.0–100.0)
MONO ABS: 0.4 10*3/uL (ref 0.2–1.0)
Monocytes Relative: 3 %
NEUTROS ABS: 12.2 10*3/uL — AB (ref 1.4–6.5)
Neutrophils Relative %: 91 %
Platelets: 162 10*3/uL (ref 150–440)
RBC: 4.35 MIL/uL — AB (ref 4.40–5.90)
RDW: 14.6 % — AB (ref 11.5–14.5)
WBC: 13.5 10*3/uL — ABNORMAL HIGH (ref 3.8–10.6)

## 2017-07-02 LAB — URINALYSIS, COMPLETE (UACMP) WITH MICROSCOPIC
BACTERIA UA: NONE SEEN
Bilirubin Urine: NEGATIVE
Glucose, UA: NEGATIVE mg/dL
Ketones, ur: NEGATIVE mg/dL
Leukocytes, UA: NEGATIVE
Nitrite: NEGATIVE
PROTEIN: 100 mg/dL — AB
Specific Gravity, Urine: 1.03 (ref 1.005–1.030)
pH: 5 (ref 5.0–8.0)

## 2017-07-02 MED ORDER — DEXTROSE IN LACTATED RINGERS 5 % IV SOLN
INTRAVENOUS | Status: AC
  Administered 2017-07-02 – 2017-07-08 (×16): via INTRAVENOUS

## 2017-07-02 MED ORDER — HEPARIN SODIUM (PORCINE) 5000 UNIT/ML IJ SOLN: 5000.0000 [IU] | Freq: Three times a day (TID) | INTRAMUSCULAR | Status: DC

## 2017-07-02 MED ORDER — ONDANSETRON HCL 4 MG/2ML IJ SOLN
INTRAMUSCULAR | Status: AC
  Filled 2017-07-02: qty 2

## 2017-07-02 MED ORDER — ONDANSETRON 4 MG PO TBDP: 4.0000 mg | ORAL_TABLET | Freq: Four times a day (QID) | ORAL | Status: DC | PRN

## 2017-07-02 MED ORDER — ONDANSETRON HCL 4 MG/2ML IJ SOLN
4.0000 mg | Freq: Once | INTRAMUSCULAR | Status: AC
  Administered 2017-07-02: 4 mg via INTRAVENOUS

## 2017-07-02 MED ORDER — PIPERACILLIN-TAZOBACTAM 3.375 G IVPB
3.3750 g | Freq: Three times a day (TID) | INTRAVENOUS | Status: AC
  Administered 2017-07-02 – 2017-07-12 (×32): 3.375 g via INTRAVENOUS
  Filled 2017-07-02 (×32): qty 50

## 2017-07-02 MED ORDER — METOPROLOL TARTRATE 5 MG/5ML IV SOLN
5.0000 mg | Freq: Four times a day (QID) | INTRAVENOUS | Status: DC
  Administered 2017-07-03: 5 mg via INTRAVENOUS
  Filled 2017-07-02 (×3): qty 5

## 2017-07-02 MED ORDER — HYDROMORPHONE HCL 1 MG/ML IJ SOLN
0.5000 mg | INTRAMUSCULAR | Status: DC | PRN
  Administered 2017-07-02 – 2017-07-09 (×6): 0.5 mg via INTRAVENOUS
  Filled 2017-07-02 (×6): qty 0.5

## 2017-07-02 MED ORDER — PIPERACILLIN-TAZOBACTAM 3.375 G IVPB 30 MIN
INTRAVENOUS | Status: AC
  Filled 2017-07-02: qty 50

## 2017-07-02 MED ORDER — ONDANSETRON HCL 4 MG/2ML IJ SOLN
4.0000 mg | Freq: Four times a day (QID) | INTRAMUSCULAR | Status: DC | PRN
  Administered 2017-07-02 – 2017-07-07 (×2): 4 mg via INTRAVENOUS
  Filled 2017-07-02 (×2): qty 2

## 2017-07-02 MED ORDER — PROMETHAZINE HCL 25 MG RE SUPP
25.0000 mg | Freq: Three times a day (TID) | RECTAL | Status: DC | PRN
  Administered 2017-07-02: 25 mg via RECTAL
  Filled 2017-07-02: qty 1

## 2017-07-02 NOTE — Progress Notes (Signed)
As per MD Enteric is not required. Pt has milk of mag hence diarrhea

## 2017-07-02 NOTE — Progress Notes (Signed)
Patient with emesis x 3 since last zofran dose. Dr. Burt Knack notified and new orders to place NG to LIWS and phenergan 25mg  suppository.

## 2017-07-02 NOTE — H&P (Signed)
SURGICAL HISTORY & PHYSICAL (cpt [phone])  HISTORY OF PRESENT ILLNESS (HPI):  71 y.o. male presented to [facility] ED tonight for abdominal pain. Patient reports his lower abdominal pain began 1.5 days ago (10/2) after he experienced acute on chronic intermittent constipation 3 days ago (10/1) for which patient took milk of magnesia twice after the first dose did not seem to help. The following day (10/2), patient experienced profuse watery diarrhea followed by acute onset of severe lower abdominal pain.   He describes his pain at the time of this history/exam as "1 out of 10", along with currently resolved intermittent nausea without emesis, fever/chills, CP, or SOB. He does, however, report SOB that requires him to stop when he walks up/down a flight of steps or similar exertion. Patient also says he last took Xarelto last night for atrial fibrillation and is due to see his cardiologist 08/2017 (q104mo), now 20 years s/p CABG for ACS without MI (last stress test 2015). Lastly, patient denies any prior known episodes of diverticulitis despite many years of constipation, and his last colonoscopy was WNL (2016).  PAST MEDICAL HISTORY (PMH):  Past Medical History:  Diagnosis Date  . Atrial fibrillation ([place])   . Coronary artery disease    s/p remote CABG 1999 per Dr. Cyndia Bent;  Lexiscan Myoview (01/2014):  No ischemia, not gated, normal study  . Hypercholesterolemia   . Hypertension   . 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 Otherwise negative.   PAST SURGICAL HISTORY ([place]):  Past Surgical History:  Procedure Laterality Date  . CORONARY ARTERY BYPASS GRAFT  1999  . excision of skin cancer    . KNEE ARTHROSCOPY W/ MENISCAL REPAIR      Reviewed. Otherwise negative.   MEDICATIONS:  Prior to Admission medications   Medication Sig Start Date End Date Taking? Authorizing Provider  carvedilol (COREG) 12.5 MG tablet Take 1.5 tablets by mouth twice a day 05/25/17  Yes Supple,  Megan E, RPH  COD LIVER OIL PO Take 1 capsule by mouth daily.    Yes [provider]  ezetimibe (ZETIA) 10 MG tablet Take 10 mg by mouth daily.   Yes [provider]  fexofenadine (ALLEGRA) 180 MG tablet Take 180 mg by mouth daily.     Yes [provider]  fish oil-omega-3 fatty acids 1000 MG capsule Take 1 g by mouth daily.    Yes [provider]  irbesartan (AVAPRO) 300 MG tablet Take 1 tablet (300 mg total) by mouth daily. 05/25/17  Yes Supple, Megan E, RPH  Multiple Vitamin (MULTIVITAMIN) tablet Take 1 tablet by mouth every other day.     Yes [provider]  nitroGLYCERIN (NITROSTAT) 0.4 MG SL tablet Place 0.4 mg under the tongue as needed for chest pain. May repeat in 15 minutes up to 3 times for chest pain. 06/24/17  Yes [provider]  rivaroxaban (XARELTO) 20 MG TABS tablet Take 1 tablet (20 mg total) by mouth daily with supper. 06/13/15  Yes [name], [name] M, MD  simvastatin (ZOCOR) 10 MG tablet Take 10 mg by mouth at bedtime.   Yes [provider]  spironolactone (ALDACTONE) 25 MG tablet Take 0.5 tablets (12.5 mg total) by mouth daily. 06/22/17  Yes [name], [name] M, MD     ALLERGIES:  Allergies  Allergen Reactions  . Shellfish Allergy      SOCIAL HISTORY:  Social History   Social History  . Marital status: Married  Spouse name: N/A  . Number of children: 2  . Years of education: N/A   Occupational History  . Print production planner    Social History Main Topics  . Smoking status: Never Smoker  . Smokeless tobacco: Former Systems developer    Types: Chew  . Alcohol use No  . Drug use: No  . Sexual activity: Yes   Other Topics Concern  . Not on file   Social History Narrative  . No narrative on file    The patient currently resides (home / rehab facility / nursing home): Home The patient normally is (ambulatory / bedbound): Ambulatory  FAMILY HISTORY:  Family History  Problem Relation Age of Onset  . Lung  disease Father 27    Otherwise negative.   REVIEW OF SYSTEMS:  Constitutional: denies any other weight loss, fever, chills, or sweats  Eyes: denies any other vision changes, history of eye injury  ENT: denies sore throat, hearing problems  Respiratory: denies shortness of breath, wheezing  Cardiovascular: denies chest pain, palpitations  Gastrointestinal: abdominal pain, N/V, and bowel function as per HPI  Genitourinary: denies burning with urination or urinary frequency Musculoskeletal: denies any other joint pains or cramps  Skin: Denies any other rashes or skin discolorations  Neurological: denies any other headache, dizziness, weakness  Psychiatric: denies any other depression, anxiety   All other review of systems were otherwise negative.  VITAL SIGNS:  Temp:  [98.8 F (37.1 C)] 98.8 F (37.1 C) (10/03 1606) Pulse Rate:  [97-113] 98 (10/03 2330) Resp:  [18-23] 18 (10/03 2330) BP: (100-166)/(71-99) 100/71 (10/03 2330) SpO2:  [92 %-97 %] 96 % (10/03 2330) Weight:  [236 lb (107 kg)] 236 lb (107 kg) (10/03 1604)     Height: 5\' 9"  (175.3 cm) Weight: 236 lb (107 kg) BMI (Calculated): 34.84   INTAKE/OUTPUT:  This shift: Total I/O In: 1050 [IV Piggyback:1050] Out: -   Last 2 shifts: @IOLAST2SHIFTS @  PHYSICAL EXAM:  Constitutional:  -- Obese body habitus  -- Awake, alert, and oriented x3  Eyes:  -- Pupils equally round and reactive to [place]  -- No scleral icterus  Ear, nose, throat:  -- No jugular venous distension  Pulmonary:  -- No crackles  -- Equal breath sounds bilaterally -- Breathing non-labored at rest Cardiovascular:  -- S1, S2 present  -- No pericardial rubs  Gastrointestinal:  -- Abdomen soft and protuberant (though patient describes as normal for him) with mild suprapubic tenderness to palpation > LLQ=RLQ, no guarding or rebound tenderness -- No abdominal masses appreciated, pulsatile or otherwise  Musculoskeletal and Integumentary:  -- Wounds or skin  discoloration: None appreciated except post-CABG chest wound -- Extremities: B/L UE and LE FROM, hands and feet warm Neurologic:  -- Motor function: Intact and symmetric -- Sensation: Intact and symmetric  Labs:  CBC Latest Ref Rng & Units 07/01/2017 01/23/2014 04/08/2013  WBC 3.8 - 10.6 K/uL 16.4(H) 5.5 4.2(L)  Hemoglobin 13.0 - 18.0 g/dL 15.1 15.6 15.4  Hematocrit 40.0 - 52.0 % 42.6 46.1 45.6  Platelets 150 - 440 K/uL 173 179.0 150.0   CMP Latest Ref Rng & Units 07/01/2017 06/08/2017 06/02/2017  Glucose 65 - 99 mg/dL 138(H) 86 103(H)  BUN 6 - 20 mg/dL 27(H) 29(H) 40(H)  Creatinine 0.61 - 1.24 mg/dL 0.90 1.02 1.37(H)  Sodium 135 - 145 mmol/L 132(L) 139 143  Potassium 3.5 - 5.1 mmol/L 4.5 4.1 3.8  Chloride 101 - 111 mmol/L 100(L) 99 102  CO2 22 - 32 mmol/L 24  25 24  Calcium 8.9 - 10.3 mg/dL 9.3 9.3 9.9  Total Protein 6.5 - 8.1 g/dL 8.2(H) - -  Total Bilirubin 0.3 - 1.2 mg/dL 3.1(H) - -  Alkaline Phos 38 - 126 U/L 45 - -  AST 15 - 41 U/L 25 - -  ALT 17 - 63 U/L 21 - -   Imaging studies:  CT Abdomen and Pelvis with Contrast (07/01/2017) - personally reviewed and discussed with patient and his wife 1. Acute, perforated sigmoid diverticulitis with small volume pneumoperitoneum. No drainable fluid collection. 2. Proximal small bowel dilatation, gradually transitioning into nondilated small bowel in the left lower quadrant, favored to represent reactive ileus. 3. 10 mm exophytic solid appearing lesion arising from the upper pole of the right kidney. Recommend further evaluation with nonemergent renal ultrasound. 4.  Aortic atherosclerosis (ICD10-I70.0). 5. 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   Assessment/Plan: (ICD-10's: K16.20) 71 y.o. 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    - NPO, IVF, IV antibiotics (Zosyn)  - discussed with patient/wife findings of CT, WBC, what this represents, and why this is an indication for surgery  - considering Xarelto + moderate leukocytosis with multiple small foci pneumoperitoneum and pain easily controlled, will treat at this time with IV antibiotics (Zosyn) and the understanding that he will likely still require surgery with exploration, drainage, colonic resection and colostomy when bleeding risk subsides and if symptoms and leukocytosis worsen or do not otherwise improve  - hold Xarelto, continue IV beta-blockade, request medical consultation for optimization/management  - pain control prn, anti-emetics prn for now  - follow-up morning repeat WBC  - DVT prophylaxis  All of the above findings and recommendations were discussed with the patient and his wife, and all of his and family's questions were answered to their expressed satisfaction.  -- Marilynne Drivers Rosana Hoes, MD, [place]: [facility] - Partnering for exceptional care. Office: [phone]

## 2017-07-02 NOTE — ED Notes (Signed)
Per [place] in pharmacy, okay to run D5/LR simultaneously with Zosyn.

## 2017-07-02 NOTE — Progress Notes (Signed)
Patient seen and examined for follow-up reassessment, reports further improved lower abdominal pain with IV antibiotics and NPO, resolved N/V with decreased abdominal firmness following nasogastric decompression (~1500 mL drained thus far). Patient otherwise denies fever/chills, CP, or SOB. BP 123/71 (BP Location: Right Arm)   Pulse 91   Temp 99.5 F (37.5 C) (Oral)   Resp 18   Ht 5\' 9"  (1.753 m)   Wt 236 lb (107 kg)   SpO2 94%   BMI 34.85 kg/m  Abdomen soft, less distended (still obese), and mild suprapubic abdominal tenderness to palpation. Will continue Zosyn, monitor abdominal exam/NGT drainage/bowel function and leukocytosis.  -- Marilynne Drivers Rosana Hoes, MD, [place]: [facility] - Partnering for exceptional care. Office: [phone]

## 2017-07-02 NOTE — Progress Notes (Signed)
CC: Perforated diverticulitis Subjective: This patient with perforated diverticulitis. He states his pain is much better than it had been earlier. He did vomit about 20 minutes ago small volume. He has not passed any gas. Denies fevers or chills but overall feels better.  Objective: Vital signs in last 24 hours: Temp:  [98.2 F (36.8 C)-98.8 F (37.1 C)] 98.4 F (36.9 C) (10/04 0653) Pulse Rate:  [94-113] 99 (10/04 0653) Resp:  [15-23] 18 (10/04 0653) BP: (100-166)/(67-99) 123/67 (10/04 0653) SpO2:  [92 %-98 %] 97 % (10/04 0653) Weight:  [236 lb (107 kg)] 236 lb (107 kg) (10/03 1604)    Intake/Output from previous day: 10/03 0701 - 10/04 0700 In: 2314 [I.V.:214; IV Piggyback:2100] Out: 700 [Urine:250; Emesis/NG output:450] Intake/Output this shift: No intake/output data recorded.  Physical exam:  Comfortable-appearing male patient vital signs are stable he is afebrile abdomen is protuberant and distended it is also tympanitic but only minimally tender with no guarding. Calves are nontender  Lab Results: CBC   Recent Labs  07/01/17 1607 07/02/17 0508  WBC 16.4* 13.5*  HGB 15.1 13.1  HCT 42.6 38.1*  PLT 173 162   BMET  Recent Labs  07/01/17 1607 07/02/17 0208  NA 132* 132*  K 4.5 4.1  CL 100* 99*  CO2 24 26  GLUCOSE 138* 143*  BUN 27* 28*  CREATININE 0.90 0.88  CALCIUM 9.3 8.3*   PT/INR  Recent Labs  07/01/17 2116  LABPROT 22.4*  INR 1.99   ABG No results for input(s): PHART, HCO3 in the last 72 hours.  Invalid input(s): PCO2, PO2  Studies/Results: Ct Abdomen Pelvis Wo Contrast  Result Date: 07/01/2017 CLINICAL DATA:  Abdominal pain and distention. EXAM: CT ABDOMEN AND PELVIS WITHOUT CONTRAST TECHNIQUE: Multidetector CT imaging of the abdomen and pelvis was performed following the standard protocol without IV contrast. COMPARISON:  None. FINDINGS: Lower chest: No acute abnormality. Coronary artery atherosclerotic vascular calcifications. Prior  CABG. Left lower lobe atelectasis. Hepatobiliary: No focal liver abnormality is seen. No gallstones, gallbladder wall thickening, or biliary dilatation. Pancreas: Mild fatty atrophy. No ductal dilatation or surrounding inflammatory changes. Spleen: Normal in size without focal abnormality. Adrenals/Urinary Tract: The adrenal glands are unremarkable. There is a 10 mm exophytic solid-appearing lesion arising from the upper pole of the right kidney. No renal calculi or hydronephrosis. The bladder is unremarkable. Stomach/Bowel: Extensive pericolonic inflammatory fat stranding and fluid surrounding an inflamed sigmoid diverticulum, consistent with diverticulitis. There are multiple foci free air. There is dilatation of the proximal small bowel which gradually transitions into nondilated small bowel in the left lower quadrant. The stomach is distended. The appendix is normal. Vascular/Lymphatic: Aortic atherosclerosis. No enlarged abdominal or pelvic lymph nodes. Reproductive: Prostate is unremarkable. Other: Small volume pneumoperitoneum. No drainable fluid collection. Bilateral fat containing inguinal hernias. Musculoskeletal: No acute or significant osseous findings. Bilateral L5 pars defects with 13 mm anterolisthesis of L5 on S1. Degenerative changes of the thoracolumbar spine. IMPRESSION: 1. Acute, perforated sigmoid diverticulitis with small volume pneumoperitoneum. No drainable fluid collection. 2. Proximal small bowel dilatation, gradually transitioning into nondilated small bowel in the left lower quadrant, favored to represent reactive ileus. 3. 10 mm exophytic solid appearing lesion arising from the upper pole of the right kidney. Recommend further evaluation with nonemergent renal ultrasound. 4.  Aortic atherosclerosis (ICD10-I70.0). 5. 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 Critical Value/emergent results were called by telephone at the time of interpretation on 07/01/2017 at  8:58 pm  to Dr. Eula Listen , who verbally acknowledged these results. Electronically Signed   By: Titus Dubin M.D.   On: 07/01/2017 21:01    Anti-infectives: Anti-infectives    Start     Dose/Rate Route Frequency Ordered Stop   07/02/17 0200  piperacillin-tazobactam (ZOSYN) IVPB 3.375 g     3.375 g 12.5 mL/hr over 240 Minutes Intravenous Every 8 hours 07/02/17 0150     07/01/17 2115  piperacillin-tazobactam (ZOSYN) IVPB 3.375 g     3.375 g 100 mL/hr over 30 Minutes Intravenous  Once 07/01/17 2103 07/01/17 2236      Assessment/Plan:  This patient with perforated diverticulitis. He has improved but the findings on CT scan suggest that he may fail conservative IV therapies. Should he not continue to improve over the next 24-48 hours he may require exploration and colostomy. This was reviewed with him his wife was present questions were answered for them they understood and agreed to proceed.  Florene Glen, MD, FACS  07/02/2017

## 2017-07-02 NOTE — ED Notes (Signed)
Patient transported to room 212 

## 2017-07-02 NOTE — Progress Notes (Signed)
Pt complains of nausea, hiccups, and vomited small amt. Of brown fluid. MD notified and will assess later. Order to give Zofran early.

## 2017-07-02 NOTE — Progress Notes (Signed)
Patient seen and examined, reassessed bedside, reports lower abdominal pain much improved, has not requested nor received any further pain medication. Patient did just vomit, but states he feels better with improved nausea now as well, denies fever/chills, CP, or SOB. BP 123/67   Pulse 99   Temp 98.4 F (36.9 C) (Oral)   Resp 18   Ht 5\' 9"  (1.753 m)   Wt 236 lb (107 kg)   SpO2 97%   BMI 34.85 kg/m . Abdomen is soft, non-tender, and protuberant (wife now says slightly larger than usual "big belly").   - NPO, IVF  - continue Zosyn  - monitor abdominal exam  - medical management of comorbidities  - still may require surgical intervention if condition worsens  - follow up morning repeat WBC  - hold Xarelto  -- Corene Cornea E. Rosana Hoes, MD, [facility]: [facility] - Partnering for exceptional care. Office: [phone]

## 2017-07-02 NOTE — Consult Note (Signed)
[facility] [place] NAME: Ryan Mccall    MR#:  [id number]  DATE OF BIRTH:  Oct 26, 1945  DATE OF ADMISSION:  07/01/2017  PRIMARY CARE PHYSICIAN: Lillard Anes, MD   REQUESTING/REFERRING PHYSICIAN: Burt Knack  CHIEF COMPLAINT:   Chief Complaint  Patient presents with  . Abdominal Pain    HISTORY OF PRESENT ILLNESS: Ryan Mccall  is a 71 y.o. male with a known history of A fib, CAD, Hypercholesterolemia, Htn- Came with abdominal pain- found to have diverticulitis with perforation and some intraabdominal pian. Admitted to surgical team. Conservative management currently. Medical consult for managing medical issues. Pt have no other complains.  PAST MEDICAL HISTORY:   Past Medical History:  Diagnosis Date  . Atrial fibrillation ([place])   . Coronary artery disease    s/p remote CABG 1999 per Dr. Cyndia Bent;  Lexiscan Myoview (01/2014):  No ischemia, not gated, normal study  . Hypercholesterolemia   . Hypertension   . 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 CORONARY ARTERY BYPASS GRAFT  1999  . excision of skin cancer    . KNEE ARTHROSCOPY W/ MENISCAL REPAIR      SOCIAL HISTORY:  Social History  Substance Use Topics  . Smoking status: Never Smoker  . Smokeless tobacco: Former Systems developer    Types: Chew  . Alcohol use No    FAMILY HISTORY:  Family History  Problem Relation Age of Onset  . Lung disease Father 50    DRUG ALLERGIES:  Allergies  Allergen Reactions  . Shellfish Allergy     REVIEW OF SYSTEMS:   CONSTITUTIONAL: No fever, fatigue or weakness.  EYES: No blurred or double vision.  EARS, NOSE, AND THROAT: No tinnitus or ear pain.  RESPIRATORY: No cough, shortness of breath, wheezing or hemoptysis.  CARDIOVASCULAR: No chest pain, orthopnea, edema.  GASTROINTESTINAL: positive for nausea, vomiting, diarrhea or abdominal pain.   GENITOURINARY: No dysuria, hematuria.  ENDOCRINE: No polyuria, nocturia,  HEMATOLOGY: No anemia, easy bruising or bleeding SKIN: No rash or lesion. MUSCULOSKELETAL: No joint pain or arthritis.   NEUROLOGIC: No tingling, numbness, weakness.  PSYCHIATRY: No anxiety or depression.   MEDICATIONS AT HOME:  Prior to Admission medications   Medication Sig Start Date End Date Taking? Authorizing Provider  carvedilol (COREG) 12.5 MG tablet Take 1.5 tablets by mouth twice a day 05/25/17  Yes Supple, Megan E, RPH  COD LIVER OIL PO Take 1 capsule by mouth daily.    Yes [provider]  ezetimibe (ZETIA) 10 MG tablet Take 10 mg by mouth daily.   Yes [provider]  fexofenadine (ALLEGRA) 180 MG tablet Take 180 mg by mouth daily.     Yes [provider]  fish oil-omega-3 fatty acids 1000 MG capsule Take 1 g by mouth daily.    Yes [provider]  irbesartan (AVAPRO) 300 MG tablet Take 1 tablet (300 mg total) by mouth daily. 05/25/17  Yes Supple, Megan E, RPH  Multiple Vitamin (MULTIVITAMIN) tablet Take 1 tablet by mouth every other day.     Yes [provider]  nitroGLYCERIN (NITROSTAT) 0.4 MG SL tablet Place 0.4 mg under the tongue as needed for chest pain. May repeat in 15 minutes up to 3 times for chest pain. 06/24/17  Yes [provider]  rivaroxaban (XARELTO) 20 MG TABS tablet Take 1 tablet (20 mg  total) by mouth daily with supper. 06/13/15  Yes [name], [name] M, MD  simvastatin (ZOCOR) 10 MG tablet Take 10 mg by mouth at bedtime.   Yes [provider]  spironolactone (ALDACTONE) 25 MG tablet Take 0.5 tablets (12.5 mg total) by mouth daily. 06/22/17  Yes [name], [name] M, MD      PHYSICAL EXAMINATION:   VITAL SIGNS: Blood pressure 118/76, pulse 90, temperature 98.8 F (37.1 C), temperature source Oral, resp. rate 18, height 5\' 9"  (1.753 m), weight 107 kg (236 lb), SpO2 98 %.  GENERAL:  71 y.o.-year-old patient lying in the bed with no  acute distress.  EYES: Pupils equal, round, reactive to Minardi and accommodation. No scleral icterus. Extraocular muscles intact.  HEENT: Head atraumatic, normocephalic. Oropharynx and nasopharynx clear. NGT with suction in place. NECK:  Supple, no jugular venous distention. No thyroid enlargement, no tenderness.  LUNGS: Normal breath sounds bilaterally, no wheezing, rales,rhonchi or crepitation. No use of accessory muscles of respiration.  CARDIOVASCULAR: S1, S2 normal. No murmurs, rubs, or gallops.  ABDOMEN: Soft, mild tender, some distended. Bowel sounds sluggish . No organomegaly or mass.  EXTREMITIES: No pedal edema, cyanosis, or clubbing.  NEUROLOGIC: Cranial nerves II through XII are intact. Muscle strength 5/5 in all extremities. Sensation intact. Gait not checked.  PSYCHIATRIC: The patient is alert and oriented x 3.  SKIN: No obvious rash, lesion, or ulcer.   LABORATORY PANEL:   CBC  Recent Labs Lab 07/01/17 1607 07/02/17 0508  WBC 16.4* 13.5*  HGB 15.1 13.1  HCT 42.6 38.1*  PLT 173 162  MCV 88.3 87.5  MCH 31.2 30.2  MCHC 35.3 34.5  RDW 14.9* 14.6*  LYMPHSABS  --  0.8*  MONOABS  --  0.4  EOSABS  --  0.0  BASOSABS  --  0.0   ------------------------------------------------------------------------------------------------------------------  Chemistries   Recent Labs Lab 07/01/17 1607 07/02/17 0208  NA 132* 132*  K 4.5 4.1  CL 100* 99*  CO2 24 26  GLUCOSE 138* 143*  BUN 27* 28*  CREATININE 0.90 0.88  CALCIUM 9.3 8.3*  AST 25  --   ALT 21  --   ALKPHOS 45  --   BILITOT 3.1*  --    ------------------------------------------------------------------------------------------------------------------ estimated creatinine clearance is 94.1 mL/min (by C-G formula based on SCr of 0.88 mg/dL). ------------------------------------------------------------------------------------------------------------------ No results for input(s): TSH, T4TOTAL, T3FREE, THYROIDAB in  the last 72 hours.  Invalid input(s): FREET3   Coagulation profile  Recent Labs Lab 07/01/17 2116  INR 1.99   ------------------------------------------------------------------------------------------------------------------- No results for input(s): DDIMER in the last 72 hours. -------------------------------------------------------------------------------------------------------------------  Cardiac Enzymes No results for input(s): CKMB, TROPONINI, MYOGLOBIN in the last 168 hours.  Invalid input(s): CK ------------------------------------------------------------------------------------------------------------------ Invalid input(s): POCBNP  ---------------------------------------------------------------------------------------------------------------  Urinalysis    Component Value Date/Time   COLORURINE AMBER (A) 07/02/2017 0700   APPEARANCEUR HAZY (A) 07/02/2017 0700   LABSPEC 1.030 07/02/2017 0700   PHURINE 5.0 07/02/2017 0700   GLUCOSEU NEGATIVE 07/02/2017 0700   HGBUR SMALL (A) 07/02/2017 0700   BILIRUBINUR NEGATIVE 07/02/2017 0700   KETONESUR NEGATIVE 07/02/2017 0700   PROTEINUR 100 (A) 07/02/2017 0700   NITRITE NEGATIVE 07/02/2017 0700   LEUKOCYTESUR NEGATIVE 07/02/2017 0700     RADIOLOGY: Ct Abdomen Pelvis Wo Contrast  Result Date: 07/01/2017 CLINICAL DATA:  Abdominal pain and distention. EXAM: CT ABDOMEN AND PELVIS WITHOUT CONTRAST TECHNIQUE: Multidetector CT imaging of the abdomen and pelvis was performed following the standard protocol without IV contrast. COMPARISON:  None. FINDINGS: Lower chest:  No acute abnormality. Coronary artery atherosclerotic vascular calcifications. Prior CABG. Left lower lobe atelectasis. Hepatobiliary: No focal liver abnormality is seen. No gallstones, gallbladder wall thickening, or biliary dilatation. Pancreas: Mild fatty atrophy. No ductal dilatation or surrounding inflammatory changes. Spleen: Normal in size without focal  abnormality. Adrenals/Urinary Tract: The adrenal glands are unremarkable. There is a 10 mm exophytic solid-appearing lesion arising from the upper pole of the right kidney. No renal calculi or hydronephrosis. The bladder is unremarkable. Stomach/Bowel: Extensive pericolonic inflammatory fat stranding and fluid surrounding an inflamed sigmoid diverticulum, consistent with diverticulitis. There are multiple foci free air. There is dilatation of the proximal small bowel which gradually transitions into nondilated small bowel in the left lower quadrant. The stomach is distended. The appendix is normal. Vascular/Lymphatic: Aortic atherosclerosis. No enlarged abdominal or pelvic lymph nodes. Reproductive: Prostate is unremarkable. Other: Small volume pneumoperitoneum. No drainable fluid collection. Bilateral fat containing inguinal hernias. Musculoskeletal: No acute or significant osseous findings. Bilateral L5 pars defects with 13 mm anterolisthesis of L5 on S1. Degenerative changes of the thoracolumbar spine. IMPRESSION: 1. Acute, perforated sigmoid diverticulitis with small volume pneumoperitoneum. No drainable fluid collection. 2. Proximal small bowel dilatation, gradually transitioning into nondilated small bowel in the left lower quadrant, favored to represent reactive ileus. 3. 10 mm exophytic solid appearing lesion arising from the upper pole of the right kidney. Recommend further evaluation with nonemergent renal ultrasound. 4.  Aortic atherosclerosis (ICD10-I70.0). 5. 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 Critical Value/emergent results were called by telephone at the time of interpretation on 07/01/2017 at 8:58 pm to Dr. Eula Listen , who verbally acknowledged these results. Electronically Signed   By: Titus Dubin M.D.   On: 07/01/2017 21:01   Dg Abd Portable 1v  Result Date: 07/02/2017 CLINICAL DATA:  Check nasogastric catheter placement EXAM: PORTABLE ABDOMEN -  1 VIEW COMPARISON:  CT from 07/01/2017 FINDINGS: Nasogastric catheter is noted within the stomach. Colonic and small bowel gas is seen but incompletely evaluated. IMPRESSION: Nasogastric catheter within the stomach. Electronically Signed   By: Inez Catalina M.D.   On: 07/02/2017 11:13    EKG: Orders placed or performed during the hospital encounter of 07/01/17  . ED EKG  . ED EKG    IMPRESSION AND PLAN:  * Diverticulitis with perforation   IV Abx and coservatiove management for now per surgery.    NGT with suction.   Hold xarelto, If does not improve, may need surgery.  * Htn   As no oral meds, on IV metoprolol.    Rate and BP well controlled, continue.  * A fib   Hold xarelto, as may need surgery   On IV metoprolol, controlled.  * Hx of CAD   IV metoprolol.   Hold statin for now until NPO>  All the records are reviewed and case discussed with ED provider. Management plans discussed with the patient, family and they are in agreement.  CODE STATUS: Full.    Code Status Orders        Start     Ordered   07/02/17 0143  Full code  Continuous     07/02/17 0150    Code Status History    Date Active Date Inactive Code Status Order ID Comments User Context   This patient has a current code status but no historical code status.    Advance Directive Documentation     Most Recent Value  Type of Advance Directive  Living  will  Pre-existing out of facility DNR order (yellow form or pink MOST form)  -  "MOST" Form in Place?  -       TOTAL TIME TAKING CARE OF THIS PATIENT: 45 minutes.  Discussed with 2 daughters in room.  Vaughan Basta M.D on 07/02/2017   Between 7am to 6pm - Pager - [phone]  After 6pm go to [email or web address] - password [email or web address] [facility]  Office  [phone]  CC: Primary care physician; Lillard Anes, MD   Note: This dictation was prepared with Dragon dictation along with smaller phrase technology. Any  transcriptional errors that result from this process are unintentional.

## 2017-07-03 ENCOUNTER — Encounter
Admission: EM | Disposition: A | Discharge status: Home Health | Payer: Self-pay | Source: Home / Self Care | Attending: Surgery

## 2017-07-03 ENCOUNTER — Inpatient Hospital Stay: Payer: PPO | Admitting: Anesthesiology

## 2017-07-03 ENCOUNTER — Encounter: Payer: Self-pay | Admitting: Emergency Medicine

## 2017-07-03 ENCOUNTER — Inpatient Hospital Stay: Payer: PPO

## 2017-07-03 DIAGNOSIS — K5720 Diverticulitis of large intestine with perforation and abscess without bleeding: Secondary | ICD-10-CM

## 2017-07-03 HISTORY — PX: COLECTOMY WITH COLOSTOMY CREATION/HARTMANN PROCEDURE: SHX6598

## 2017-07-03 LAB — CBC
HCT: 37.5 % — ABNORMAL LOW (ref 40.0–52.0)
Hemoglobin: 13.3 g/dL (ref 13.0–18.0)
MCH: 31.3 pg (ref 26.0–34.0)
MCHC: 35.5 g/dL (ref 32.0–36.0)
MCV: 88 fL (ref 80.0–100.0)
PLATELETS: 179 10*3/uL (ref 150–440)
RBC: 4.26 MIL/uL — ABNORMAL LOW (ref 4.40–5.90)
RDW: 14.5 % (ref 11.5–14.5)
WBC: 11.5 10*3/uL — ABNORMAL HIGH (ref 3.8–10.6)

## 2017-07-03 LAB — SURGICAL PCR SCREEN
MRSA, PCR: NEGATIVE
Staphylococcus aureus: POSITIVE — AB

## 2017-07-03 SURGERY — COLECTOMY, WITH COLOSTOMY CREATION
Anesthesia: General | Site: Abdomen | Wound class: Contaminated

## 2017-07-03 MED ORDER — INSULIN ASPART 100 UNIT/ML ~~LOC~~ SOLN: 0.0000 [IU] | Freq: Three times a day (TID) | SUBCUTANEOUS | Status: DC

## 2017-07-03 MED ORDER — CARVEDILOL 6.25 MG PO TABS
18.7500 mg | ORAL_TABLET | Freq: Two times a day (BID) | ORAL | Status: DC
  Administered 2017-07-03: 18.75 mg via ORAL
  Filled 2017-07-03 (×3): qty 3

## 2017-07-03 MED ORDER — FENTANYL CITRATE (PF) 100 MCG/2ML IJ SOLN
25.0000 ug | INTRAMUSCULAR | Status: AC | PRN
  Administered 2017-07-03 (×6): 25 ug via INTRAVENOUS

## 2017-07-03 MED ORDER — SUGAMMADEX SODIUM 200 MG/2ML IV SOLN
INTRAVENOUS | Status: AC
Start: 2017-07-03 — End: 2017-07-03
  Filled 2017-07-03: qty 2

## 2017-07-03 MED ORDER — FENTANYL CITRATE (PF) 100 MCG/2ML IJ SOLN
INTRAMUSCULAR | Status: AC
  Administered 2017-07-03: 25 ug via INTRAVENOUS
  Filled 2017-07-03: qty 2

## 2017-07-03 MED ORDER — FENTANYL CITRATE (PF) 100 MCG/2ML IJ SOLN
INTRAMUSCULAR | Status: AC
  Filled 2017-07-03: qty 2

## 2017-07-03 MED ORDER — ONDANSETRON HCL 4 MG/2ML IJ SOLN: 4.0000 mg | Freq: Four times a day (QID) | INTRAMUSCULAR | Status: DC | PRN

## 2017-07-03 MED ORDER — RIVAROXABAN 20 MG PO TABS: 20.0000 mg | ORAL_TABLET | Freq: Every day | ORAL | Status: DC

## 2017-07-03 MED ORDER — SUGAMMADEX SODIUM 200 MG/2ML IV SOLN
INTRAVENOUS | Status: DC | PRN
  Administered 2017-07-03: 220 mg via INTRAVENOUS

## 2017-07-03 MED ORDER — SUCCINYLCHOLINE CHLORIDE 20 MG/ML IJ SOLN
INTRAMUSCULAR | Status: DC | PRN
  Administered 2017-07-03: 100 mg via INTRAVENOUS
  Administered 2017-07-03: 120 mg via INTRAVENOUS

## 2017-07-03 MED ORDER — DIPHENHYDRAMINE HCL 12.5 MG/5ML PO ELIX
12.5000 mg | ORAL_SOLUTION | Freq: Four times a day (QID) | ORAL | Status: DC | PRN
  Filled 2017-07-03: qty 5

## 2017-07-03 MED ORDER — KETOROLAC TROMETHAMINE 30 MG/ML IJ SOLN
INTRAMUSCULAR | Status: DC | PRN
  Administered 2017-07-03: 30 mg via INTRAVENOUS

## 2017-07-03 MED ORDER — LACTATED RINGERS IV SOLN
INTRAVENOUS | Status: DC | PRN
  Administered 2017-07-03: 15:00:00 via INTRAVENOUS

## 2017-07-03 MED ORDER — FENTANYL CITRATE (PF) 100 MCG/2ML IJ SOLN
25.0000 ug | INTRAMUSCULAR | Status: AC | PRN
  Administered 2017-07-03 (×2): 25 ug via INTRAVENOUS

## 2017-07-03 MED ORDER — ONDANSETRON HCL 4 MG/2ML IJ SOLN
INTRAMUSCULAR | Status: DC | PRN
  Administered 2017-07-03: 4 mg via INTRAVENOUS

## 2017-07-03 MED ORDER — LIDOCAINE HCL (CARDIAC) 20 MG/ML IV SOLN
INTRAVENOUS | Status: DC | PRN
  Administered 2017-07-03: 60 mg via INTRAVENOUS

## 2017-07-03 MED ORDER — MUPIROCIN 2 % EX OINT
1.0000 "application " | TOPICAL_OINTMENT | Freq: Two times a day (BID) | CUTANEOUS | Status: AC
  Administered 2017-07-03 – 2017-07-08 (×10): 1 via NASAL
  Filled 2017-07-03 (×2): qty 22

## 2017-07-03 MED ORDER — ACETAMINOPHEN 10 MG/ML IV SOLN
INTRAVENOUS | Status: DC | PRN
  Administered 2017-07-03: 1000 mg via INTRAVENOUS

## 2017-07-03 MED ORDER — ROCURONIUM BROMIDE 100 MG/10ML IV SOLN
INTRAVENOUS | Status: DC | PRN
  Administered 2017-07-03: 20 mg via INTRAVENOUS
  Administered 2017-07-03: 25 mg via INTRAVENOUS

## 2017-07-03 MED ORDER — ONDANSETRON HCL 4 MG/2ML IJ SOLN
INTRAMUSCULAR | Status: AC
  Filled 2017-07-03: qty 2

## 2017-07-03 MED ORDER — ROCURONIUM BROMIDE 50 MG/5ML IV SOLN
INTRAVENOUS | Status: AC
  Filled 2017-07-03: qty 1

## 2017-07-03 MED ORDER — SIMVASTATIN 10 MG PO TABS
10.0000 mg | ORAL_TABLET | Freq: Every day | ORAL | Status: DC
  Administered 2017-07-03 – 2017-07-13 (×10): 10 mg via ORAL
  Filled 2017-07-03 (×12): qty 1

## 2017-07-03 MED ORDER — IRBESARTAN 150 MG PO TABS
300.0000 mg | ORAL_TABLET | Freq: Every day | ORAL | Status: DC
  Administered 2017-07-03: 300 mg via ORAL
  Filled 2017-07-03: qty 2

## 2017-07-03 MED ORDER — FENTANYL CITRATE (PF) 100 MCG/2ML IJ SOLN
INTRAMUSCULAR | Status: DC | PRN
  Administered 2017-07-03: 100 ug via INTRAVENOUS

## 2017-07-03 MED ORDER — SODIUM CHLORIDE 0.9% FLUSH: 9.0000 mL | INTRAVENOUS | Status: DC | PRN

## 2017-07-03 MED ORDER — DEXAMETHASONE SODIUM PHOSPHATE 10 MG/ML IJ SOLN
INTRAMUSCULAR | Status: DC | PRN
  Administered 2017-07-03: 5 mg via INTRAVENOUS

## 2017-07-03 MED ORDER — ACETAMINOPHEN 10 MG/ML IV SOLN
INTRAVENOUS | Status: AC
  Filled 2017-07-03: qty 100

## 2017-07-03 MED ORDER — KETOROLAC TROMETHAMINE 30 MG/ML IJ SOLN
INTRAMUSCULAR | Status: AC
  Filled 2017-07-03: qty 1

## 2017-07-03 MED ORDER — SODIUM CHLORIDE FLUSH 0.9 % IV SOLN
INTRAVENOUS | Status: AC
  Filled 2017-07-03: qty 3

## 2017-07-03 MED ORDER — PHENYLEPHRINE HCL 10 MG/ML IJ SOLN
INTRAMUSCULAR | Status: DC | PRN
  Administered 2017-07-03 (×2): 200 ug via INTRAVENOUS
  Administered 2017-07-03: 100 ug via INTRAVENOUS
  Administered 2017-07-03: 150 ug via INTRAVENOUS
  Administered 2017-07-03: 100 ug via INTRAVENOUS
  Administered 2017-07-03: 200 ug via INTRAVENOUS

## 2017-07-03 MED ORDER — MIDAZOLAM HCL 5 MG/5ML IJ SOLN
INTRAMUSCULAR | Status: DC | PRN
  Administered 2017-07-03: 1 mg via INTRAVENOUS

## 2017-07-03 MED ORDER — CHLORHEXIDINE GLUCONATE CLOTH 2 % EX PADS
6.0000 | MEDICATED_PAD | Freq: Every day | CUTANEOUS | Status: AC
  Administered 2017-07-03 – 2017-07-07 (×4): 6 via TOPICAL

## 2017-07-03 MED ORDER — PROPOFOL 10 MG/ML IV BOLUS
INTRAVENOUS | Status: DC | PRN
  Administered 2017-07-03: 150 mg via INTRAVENOUS
  Administered 2017-07-03: 50 mg via INTRAVENOUS

## 2017-07-03 MED ORDER — DIPHENHYDRAMINE HCL 50 MG/ML IJ SOLN
12.5000 mg | Freq: Four times a day (QID) | INTRAMUSCULAR | Status: DC | PRN
  Filled 2017-07-03: qty 1

## 2017-07-03 MED ORDER — MIDAZOLAM HCL 2 MG/2ML IJ SOLN
INTRAMUSCULAR | Status: AC
  Filled 2017-07-03: qty 2

## 2017-07-03 MED ORDER — NALOXONE HCL 0.4 MG/ML IJ SOLN: 0.4000 mg | INTRAMUSCULAR | Status: DC | PRN

## 2017-07-03 MED ORDER — LACTATED RINGERS IV BOLUS (SEPSIS)
300.0000 mL | Freq: Once | INTRAVENOUS | Status: AC
  Administered 2017-07-03: 300 mL via INTRAVENOUS

## 2017-07-03 MED ORDER — SUCCINYLCHOLINE CHLORIDE 20 MG/ML IJ SOLN
INTRAMUSCULAR | Status: AC
  Filled 2017-07-03: qty 1

## 2017-07-03 MED ORDER — BUPIVACAINE-EPINEPHRINE (PF) 0.25% -1:200000 IJ SOLN
INTRAMUSCULAR | Status: DC | PRN
  Administered 2017-07-03: 30 mL via PERINEURAL

## 2017-07-03 MED ORDER — PROPOFOL 10 MG/ML IV BOLUS
INTRAVENOUS | Status: AC
  Filled 2017-07-03: qty 20

## 2017-07-03 MED ORDER — DEXAMETHASONE SODIUM PHOSPHATE 10 MG/ML IJ SOLN
INTRAMUSCULAR | Status: AC
  Filled 2017-07-03: qty 1

## 2017-07-03 MED ORDER — SPIRONOLACTONE 25 MG PO TABS
12.5000 mg | ORAL_TABLET | Freq: Every day | ORAL | Status: DC
  Administered 2017-07-03 – 2017-07-04 (×3): 12.5 mg via ORAL
  Filled 2017-07-03 (×3): qty 1

## 2017-07-03 MED ORDER — LACTATED RINGERS IV SOLN
INTRAVENOUS | Status: DC | PRN
  Administered 2017-07-03 (×2): via INTRAVENOUS

## 2017-07-03 MED ORDER — HYDROMORPHONE HCL 1 MG/ML IJ SOLN
INTRAMUSCULAR | Status: DC | PRN
  Administered 2017-07-03: .2 mg via INTRAVENOUS

## 2017-07-03 MED ORDER — INSULIN ASPART 100 UNIT/ML ~~LOC~~ SOLN: 0.0000 [IU] | Freq: Every day | SUBCUTANEOUS | Status: DC

## 2017-07-03 MED ORDER — HYDROMORPHONE HCL 1 MG/ML IJ SOLN
INTRAMUSCULAR | Status: AC
  Filled 2017-07-03: qty 1

## 2017-07-03 MED ORDER — PROMETHAZINE HCL 25 MG/ML IJ SOLN: 6.2500 mg | INTRAMUSCULAR | Status: DC | PRN

## 2017-07-03 MED ORDER — HYDROMORPHONE 1 MG/ML IV SOLN
INTRAVENOUS | Status: DC
  Administered 2017-07-03: 1.5 mg via INTRAVENOUS
  Administered 2017-07-03: 0.3 mg via INTRAVENOUS
  Administered 2017-07-04: 1.2 mg via INTRAVENOUS
  Administered 2017-07-04: 0.9 mg via INTRAVENOUS
  Administered 2017-07-04: 2.1 mg via INTRAVENOUS
  Administered 2017-07-04: 1.8 mg via INTRAVENOUS
  Administered 2017-07-05: 1.2 mg via INTRAVENOUS
  Administered 2017-07-05: 0.6 mg via INTRAVENOUS
  Administered 2017-07-05: 1.5 mg via INTRAVENOUS
  Administered 2017-07-05: 0.9 mg via INTRAVENOUS
  Administered 2017-07-06 (×2): 1.2 mg via INTRAVENOUS
  Administered 2017-07-06: 1.5 mg via INTRAVENOUS
  Administered 2017-07-06: 0.6 mg via INTRAVENOUS
  Administered 2017-07-06: via INTRAVENOUS
  Administered 2017-07-06: 0.9 mg via INTRAVENOUS
  Administered 2017-07-07: 0 mg via INTRAVENOUS
  Administered 2017-07-07: 0.06 mg via INTRAVENOUS
  Administered 2017-07-07: 0.3 mg via INTRAVENOUS
  Administered 2017-07-07: 0 mg via INTRAVENOUS
  Filled 2017-07-03 (×2): qty 25

## 2017-07-03 MED ORDER — MEPERIDINE HCL 50 MG/ML IJ SOLN: 6.2500 mg | INTRAMUSCULAR | Status: DC | PRN

## 2017-07-03 SURGICAL SUPPLY — 64 items
3.0 NYLON ×2 IMPLANT
ADH LQ OCL WTPRF AMP STRL LF (MISCELLANEOUS) ×1
ADHESIVE MASTISOL STRL (MISCELLANEOUS) ×3 IMPLANT
BLADE CLIPPER SURG (BLADE) ×2 IMPLANT
BRR ADH 6X5 SEPRAFILM 1 SHT (MISCELLANEOUS) ×1
CANISTER SUCT 1200ML W/VALVE (MISCELLANEOUS) ×3 IMPLANT
CHLORAPREP W/TINT 26ML (MISCELLANEOUS) ×3 IMPLANT
CLOSURE WOUND 1/2 X4 (GAUZE/BANDAGES/DRESSINGS) ×4
COVER CLAMP SIL LG PBX B (MISCELLANEOUS) IMPLANT
DRAPE LAPAROTOMY 100X77 ABD (DRAPES) ×3 IMPLANT
DRAPE LEGGINS SURG 28X43 STRL (DRAPES) IMPLANT
DRSG OPSITE POSTOP 4X10 (GAUZE/BANDAGES/DRESSINGS) ×3 IMPLANT
DRSG OPSITE POSTOP 4X8 (GAUZE/BANDAGES/DRESSINGS) ×3 IMPLANT
DRSG TELFA 3X8 NADH (GAUZE/BANDAGES/DRESSINGS) ×3 IMPLANT
ELECT BLADE 6.5 EXT (BLADE) ×2 IMPLANT
ELECT CAUTERY BLADE 6.4 (BLADE) ×3 IMPLANT
ELECT REM PT RETURN 9FT ADLT (ELECTROSURGICAL) ×3
ELECTRODE REM PT RTRN 9FT ADLT (ELECTROSURGICAL) ×1 IMPLANT
GAUZE SPONGE 4X4 12PLY STRL (GAUZE/BANDAGES/DRESSINGS) ×2 IMPLANT
GLOVE BIO SURGEON STRL SZ8 (GLOVE) ×12 IMPLANT
GLOVE INDICATOR 8.0 STRL GRN (GLOVE) ×12 IMPLANT
GOWN STRL REUS W/ TWL LRG LVL3 (GOWN DISPOSABLE) ×4 IMPLANT
GOWN STRL REUS W/ TWL XL LVL3 (GOWN DISPOSABLE) ×2 IMPLANT
GOWN STRL REUS W/TWL LRG LVL3 (GOWN DISPOSABLE) ×12
GOWN STRL REUS W/TWL XL LVL3 (GOWN DISPOSABLE) ×6
HANDLE YANKAUER SUCT BULB TIP (MISCELLANEOUS) ×2 IMPLANT
JACKSON PRATT 10 (INSTRUMENTS) ×4 IMPLANT
KIT OSTOMY 2 PC DRNBL 2.25 STR (WOUND CARE) IMPLANT
KIT OSTOMY DRAINABLE 2.25 STR (WOUND CARE) ×3
KIT RM TURNOVER STRD PROC AR (KITS) ×3 IMPLANT
LABEL OR SOLS (LABEL) ×3 IMPLANT
NDL SAFETY 22GX1.5 (NEEDLE) ×3 IMPLANT
NS IRRIG 1000ML POUR BTL (IV SOLUTION) ×3 IMPLANT
PACK BASIN MAJOR ARMC (MISCELLANEOUS) ×3 IMPLANT
PACK COLON CLEAN CLOSURE (MISCELLANEOUS) ×3 IMPLANT
PAD DRESSING TELFA 3X8 NADH (GAUZE/BANDAGES/DRESSINGS) ×1 IMPLANT
RELOAD PROXIMATE 30MM BLUE (ENDOMECHANICALS) IMPLANT
RELOAD PROXIMATE 75MM BLUE (ENDOMECHANICALS) ×3 IMPLANT
RELOAD STAPLE 30 3.6 BLU REG (ENDOMECHANICALS) IMPLANT
RELOAD STAPLE 75 3.8 BLU REG (ENDOMECHANICALS) IMPLANT
RELOAD STAPLER LINEAR PROX 30 (STAPLE) IMPLANT
SEPRAFILM MEMBRANE 5X6 (MISCELLANEOUS) ×2 IMPLANT
SET YANKAUER POOLE SUCT (MISCELLANEOUS) ×3 IMPLANT
SPONGE LAP 18X18 5 PK (GAUZE/BANDAGES/DRESSINGS) ×5 IMPLANT
STAPLER PROXIMATE 75MM BLUE (STAPLE) IMPLANT
STAPLER RELOAD LINEAR PROX 30 (STAPLE)
STAPLER RELOADABLE 30 BLU REG (STAPLE) IMPLANT
STAPLER SKIN PROX 35W (STAPLE) ×3 IMPLANT
STRIP CLOSURE SKIN 1/2X4 (GAUZE/BANDAGES/DRESSINGS) ×8 IMPLANT
SUT ETHILON 3-0 FS-10 30 BLK (SUTURE) ×3
SUT MNCRL 3-0 UNDYED SH (SUTURE) ×2 IMPLANT
SUT MONOCRYL 3-0 UNDYED (SUTURE) ×4
SUT PDS AB 1 CT1 27 (SUTURE) ×3 IMPLANT
SUT PDS AB 1 TP1 54 (SUTURE) ×6 IMPLANT
SUT SILK 0 (SUTURE) ×3
SUT SILK 0 30XBRD TIE 6 (SUTURE) ×1 IMPLANT
SUT SILK 3-0 (SUTURE) ×15 IMPLANT
SUT VIC AB 1 CTX 27 (SUTURE) ×9 IMPLANT
SUT VICRYL 0 TIES 12 18 (SUTURE) ×6 IMPLANT
SUTURE EHLN 3-0 FS-10 30 BLK (SUTURE) IMPLANT
SYRINGE 10CC LL (SYRINGE) ×3 IMPLANT
TRAY FOLEY W/METER SILVER 16FR (SET/KITS/TRAYS/PACK) ×3 IMPLANT
TUBING CONNECTING 10 (TUBING) ×1 IMPLANT
TUBING CONNECTING 10' (TUBING) ×1

## 2017-07-03 NOTE — Progress Notes (Signed)
NT took routine vitals at 1215 patients blood pressure was 91/53, Dr. Posey Pronto notified. Wife stated that only symptom patient was exhibiting was sleepiness. Patient denied dizziness, notified patient and wife to notify nursing staff for assistance if patient needs to get up, patient and wife verbalized understanding. Per Dr. Fritzi Mandes continue to monitor patient and if he exhibits symptoms of hypotension notify her.

## 2017-07-03 NOTE — Progress Notes (Signed)
CC: Acute diverticulitis with microperforation. Subjective: Patient confirms again today that he is feeling better overall still has some lower abdominal pain but improved. No further nausea with a nasogastric tube in place. He had vomited several times yesterday this necessitating the NG placement. No fevers or chills. Objective: Vital signs in last 24 hours: Temp:  [98.2 F (36.8 C)-99.5 F (37.5 C)] 98.2 F (36.8 C) (10/05 0520) Pulse Rate:  [87-106] 106 (10/05 0520) Resp:  [18] 18 (10/05 0520) BP: (101-123)/(62-76) 101/63 (10/05 0520) SpO2:  [94 %-98 %] 95 % (10/05 0520) Last BM Date: 06/30/17 (Tuesday morning)  Intake/Output from previous day: 10/04 0701 - 10/05 0700 In: 3172.9 [I.V.:3072.9; IV Piggyback:100] Out: 3040 [Urine:890; Emesis/NG output:1350] Intake/Output this shift: No intake/output data recorded.  Physical exam:  Vital signs are stable and reviewed afebrile. Eyes nose reviewed.  Abdomen is protuberant and distended tympanitic but much less tender. No peritoneal signs no guarding.  Nontender calves. No icterus no jaundice  Lab Results: CBC   Recent Labs  07/02/17 0508 07/03/17 0519  WBC 13.5* 11.5*  HGB 13.1 13.3  HCT 38.1* 37.5*  PLT 162 179   BMET  Recent Labs  07/01/17 1607 07/02/17 0208  NA 132* 132*  K 4.5 4.1  CL 100* 99*  CO2 24 26  GLUCOSE 138* 143*  BUN 27* 28*  CREATININE 0.90 0.88  CALCIUM 9.3 8.3*   PT/INR  Recent Labs  07/01/17 2116  LABPROT 22.4*  INR 1.99   ABG No results for input(s): PHART, HCO3 in the last 72 hours.  Invalid input(s): PCO2, PO2  Studies/Results: Ct Abdomen Pelvis Wo Contrast  Result Date: 07/01/2017 CLINICAL DATA:  Abdominal pain and distention. EXAM: CT ABDOMEN AND PELVIS WITHOUT CONTRAST TECHNIQUE: Multidetector CT imaging of the abdomen and pelvis was performed following the standard protocol without IV contrast. COMPARISON:  None. FINDINGS: Lower chest: No acute abnormality. Coronary  artery atherosclerotic vascular calcifications. Prior CABG. Left lower lobe atelectasis. Hepatobiliary: No focal liver abnormality is seen. No gallstones, gallbladder wall thickening, or biliary dilatation. Pancreas: Mild fatty atrophy. No ductal dilatation or surrounding inflammatory changes. Spleen: Normal in size without focal abnormality. Adrenals/Urinary Tract: The adrenal glands are unremarkable. There is a 10 mm exophytic solid-appearing lesion arising from the upper pole of the right kidney. No renal calculi or hydronephrosis. The bladder is unremarkable. Stomach/Bowel: Extensive pericolonic inflammatory fat stranding and fluid surrounding an inflamed sigmoid diverticulum, consistent with diverticulitis. There are multiple foci free air. There is dilatation of the proximal small bowel which gradually transitions into nondilated small bowel in the left lower quadrant. The stomach is distended. The appendix is normal. Vascular/Lymphatic: Aortic atherosclerosis. No enlarged abdominal or pelvic lymph nodes. Reproductive: Prostate is unremarkable. Other: Small volume pneumoperitoneum. No drainable fluid collection. Bilateral fat containing inguinal hernias. Musculoskeletal: No acute or significant osseous findings. Bilateral L5 pars defects with 13 mm anterolisthesis of L5 on S1. Degenerative changes of the thoracolumbar spine. IMPRESSION: 1. Acute, perforated sigmoid diverticulitis with small volume pneumoperitoneum. No drainable fluid collection. 2. Proximal small bowel dilatation, gradually transitioning into nondilated small bowel in the left lower quadrant, favored to represent reactive ileus. 3. 10 mm exophytic solid appearing lesion arising from the upper pole of the right kidney. Recommend further evaluation with nonemergent renal ultrasound. 4.  Aortic atherosclerosis (ICD10-I70.0). 5. 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 Critical Value/emergent results were called by  telephone at the time of interpretation on 07/01/2017 at 8:58 pm to  Dr. Eula Listen , who verbally acknowledged these results. Electronically Signed   By: Titus Dubin M.D.   On: 07/01/2017 21:01   Dg Abd Portable 1v  Result Date: 07/02/2017 CLINICAL DATA:  Check nasogastric catheter placement EXAM: PORTABLE ABDOMEN - 1 VIEW COMPARISON:  CT from 07/01/2017 FINDINGS: Nasogastric catheter is noted within the stomach. Colonic and small bowel gas is seen but incompletely evaluated. IMPRESSION: Nasogastric catheter within the stomach. Electronically Signed   By: Inez Catalina M.D.   On: 07/02/2017 11:13    Anti-infectives: Anti-infectives    Start     Dose/Rate Route Frequency Ordered Stop   07/02/17 0200  piperacillin-tazobactam (ZOSYN) IVPB 3.375 g     3.375 g 12.5 mL/hr over 240 Minutes Intravenous Every 8 hours 07/02/17 0150     07/01/17 2115  piperacillin-tazobactam (ZOSYN) IVPB 3.375 g     3.375 g 100 mL/hr over 30 Minutes Intravenous  Once 07/01/17 2103 07/01/17 2236      Assessment/Plan:  White blood cell count improving with NG tube now in place for nausea and vomiting yesterday. I will obtain a KUB today to assess for ileus. Continue IV antibiotics at this point but overall the patient is improving area  Florene Glen, MD, FACS  07/03/2017

## 2017-07-03 NOTE — Progress Notes (Signed)
Patient wife requested that BP be taken after administration of BP medication because per wife " he seems to be sleepy and he gets that way when his pressure drops". BP documented in flowsheets. Wife also stated " he normally takes his spironolactone at night", pharmacy notified to change spironolactone dose to night time administration. BP did decrease from initial BP prior to BP meds.

## 2017-07-03 NOTE — Op Note (Signed)
07/01/2017 - 07/03/2017  4:23 PM  PATIENT:  Ryan Mccall  71 y.o. male  PRE-OPERATIVE DIAGNOSIS: Perforated diverticulitis  POST-OPERATIVE DIAGNOSIS:  Perforated diverticulitis  PROCEDURE: Exploratory laparotomy and sigmoid colon resection colostomy (Hartman's procedure)  SURGEON:  Florene Glen MD, FACS  ANESTHESIA:   Gen. with endotracheal tube Asst.: Surgical tech  Details of Procedure: This a patient with perforated diverticulitis who is failed conservative therapy with IV antibiotics. He has worsening pain and worsening x-ray. Preoperatively discussed rationale for surgery the options of observation risk bleeding infection recurrence ostomy and the need for further surgery including colostomy closure etc. he has family understood and agreed to proceed.  Findings no discrete abscess free fluid in the pelvis some fibrillar no purulent exudate in the interloop areas but no discrete loculated abscess. Redundant sigmoid colon with a long segment left in place.  Description of procedure: Patient was discharged to general anesthesia he was on IV antibiotics and a Foley catheter was placed VT E prophylaxis was in place.  He was then prepped draped sterile fashion a surgical pause was held. And a midline incision was utilized to open and explore the abdominal cavity. A minimal amount of fluid exuded and it was serosanguineous in nature. Fibrillar no purulent exudate was noted on loops of small bowel. A large phlegmon S mass was noted in the redundant sigmoid colon anteriorly. There was little if any inflammation in the pelvis. A segment of distal sigmoid colon was identified and a GIA stapler was fired across this segment. Lateral reflection was taken down sharply and the ureter was protected. Dissection up above the or cephalad to the inflamed segment of perforated colon was performed and then a GIA was stapler was fired there is well. The mesentery was taken down between clamps and ligated or  doubly ligated with 0 silk ligatures. The specimen was inspected and then sent off for examination.  Because of the patient's tremendous obesity and protuberant abdomen the lateral extent and reflection was divided sharply and with electrocautery towards the spleen. The splenic flexure was not taken down but adequate length was obtained by moving up the lateral reflection. A site was chosen for colostomy. The site was chosen and then skin was excised in circular fashion and dissection down to the fascia was performed a cruciate incision was made and the rectus muscle was split and the posterior fascia was opened. The colon was passed through the colostomy site and observed.  Area was irrigated with copious amounts normal saline and there was no sign of feculent contamination and only serosanguineous fluid.  Drains were placed as followed through the right lower quadrant and left lower quadrant small incisions were made and drains were placed. These were 10 mm JP drains on the right side was placed a drain into the interloop area where a significant amount of interloop fibrillar purulent material was identified this was held in with 3-0 nylon. The second drain on the left was placed into the pelvis and held in with 3-0 nylon.  The sponge lap needle count was correct at this time and the wound was checked and found to show no signs of bleeding therefore a piece of Seprafilm was placed followed by running #1 PDS. Marcaine was infiltrated into the skin and subcutaneous tissues tissues for a total of 30 cc the subcutaneous space was irrigated with normal saline and then a Penrose drain was placed and subcutaneous tissues tissues and the wound was closed with staples. A blue towel  was placed over the wound.  The colostomy was inspected and found to be viable. It was matured by excising some of the fatty tissue around the very large and fatty:  it was noted that the lumen however was quite small compared to the  actual outside of the colon. The ostomy was digitalized. The colon was matured as a colostomy utilizing interrupted 3-0 Vicryl suture. An appliance was placed.  Patient hour this procedure well the workup occasions he was taken to the recovery room in stable condition to be admitted for continued care. Sponge lap needle count was correct. The estimated blood loss was 300 cc.   Florene Glen, MD FACS

## 2017-07-03 NOTE — Progress Notes (Signed)
[facility] [place] NAME: Ryan Mccall    MR#:  [id number]  DATE OF BIRTH:  14-Feb-1946  SUBJECTIVE:  Patient admitted with abdominal pain nausea or vomiting. NG tube placed.900 cc of bilious output. Patient feels a whole better. No fever.  REVIEW OF SYSTEMS:   Review of Systems  Constitutional: Negative for chills, fever and weight loss.  HENT: Negative for ear discharge, ear pain and nosebleeds.   Eyes: Negative for blurred vision, pain and discharge.  Respiratory: Negative for sputum production, shortness of breath, wheezing and stridor.   Cardiovascular: Negative for chest pain, palpitations, orthopnea and PND.  Gastrointestinal: Positive for abdominal pain, nausea and vomiting. Negative for diarrhea.  Genitourinary: Negative for frequency and urgency.  Musculoskeletal: Negative for back pain and joint pain.  Neurological: Positive for weakness. Negative for sensory change, speech change and focal weakness.  Psychiatric/Behavioral: Negative for depression and hallucinations. The patient is not nervous/anxious.    Tolerating Diet:npo Tolerating PT: ambulatory  DRUG ALLERGIES:   Allergies  Allergen Reactions  . Shellfish Allergy     VITALS:  Blood pressure 101/63, pulse (!) 106, temperature 98.2 F (36.8 C), temperature source Oral, resp. rate 18, height 5\' 9"  (1.753 m), weight 107 kg (236 lb), SpO2 95 %.  PHYSICAL EXAMINATION:   Physical Exam  GENERAL:  71 y.o.-year-old patient lying in the bed with no acute distress. obese EYES: Pupils equal, round, reactive to [place] and accommodation. No scleral icterus. Extraocular muscles intact.  HEENT: Head atraumatic, normocephalic. Oropharynx and nasopharynx clear.  NECK:  Supple, no jugular venous distention. No thyroid enlargement, no tenderness.  LUNGS: Normal breath sounds bilaterally, no wheezing, rales, rhonchi. No use of accessory muscles of respiration.  CARDIOVASCULAR:  S1, S2 normal. No murmurs, rubs, or gallops.  ABDOMEN: Soft, nontender, distended ++. Bowel sounds present. No organomegaly or mass.  EXTREMITIES: No cyanosis, clubbing or edema b/l.    NEUROLOGIC: Cranial nerves II through XII are intact. No focal Motor or sensory deficits b/l.   PSYCHIATRIC:  patient is alert and oriented x 3.  SKIN: No obvious rash, lesion, or ulcer.   LABORATORY PANEL:  CBC  Recent Labs Lab 07/03/17 0519  WBC 11.5*  HGB 13.3  HCT 37.5*  PLT 179    Chemistries   Recent Labs Lab 07/01/17 1607 07/02/17 0208  NA 132* 132*  K 4.5 4.1  CL 100* 99*  CO2 24 26  GLUCOSE 138* 143*  BUN 27* 28*  CREATININE 0.90 0.88  CALCIUM 9.3 8.3*  AST 25  --   ALT 21  --   ALKPHOS 45  --   BILITOT 3.1*  --    Cardiac Enzymes No results for input(s): TROPONINI in the last 168 hours. RADIOLOGY:  Ct Abdomen Pelvis Wo Contrast  Result Date: 07/01/2017 CLINICAL DATA:  Abdominal pain and distention. EXAM: CT ABDOMEN AND PELVIS WITHOUT CONTRAST TECHNIQUE: Multidetector CT imaging of the abdomen and pelvis was performed following the standard protocol without IV contrast. COMPARISON:  None. FINDINGS: Lower chest: No acute abnormality. Coronary artery atherosclerotic vascular calcifications. Prior CABG. Left lower lobe atelectasis. Hepatobiliary: No focal liver abnormality is seen. No gallstones, gallbladder wall thickening, or biliary dilatation. Pancreas: Mild fatty atrophy. No ductal dilatation or surrounding inflammatory changes. Spleen: Normal in size without focal abnormality. Adrenals/Urinary Tract: The adrenal glands are unremarkable. There is a 10 mm exophytic solid-appearing lesion arising from the upper pole of the right kidney. No renal calculi or  hydronephrosis. The bladder is unremarkable. Stomach/Bowel: Extensive pericolonic inflammatory fat stranding and fluid surrounding an inflamed sigmoid diverticulum, consistent with diverticulitis. There are multiple foci free air.  There is dilatation of the proximal small bowel which gradually transitions into nondilated small bowel in the left lower quadrant. The stomach is distended. The appendix is normal. Vascular/Lymphatic: Aortic atherosclerosis. No enlarged abdominal or pelvic lymph nodes. Reproductive: Prostate is unremarkable. Other: Small volume pneumoperitoneum. No drainable fluid collection. Bilateral fat containing inguinal hernias. Musculoskeletal: No acute or significant osseous findings. Bilateral L5 pars defects with 13 mm anterolisthesis of L5 on S1. Degenerative changes of the thoracolumbar spine. IMPRESSION: 1. Acute, perforated sigmoid diverticulitis with small volume pneumoperitoneum. No drainable fluid collection. 2. Proximal small bowel dilatation, gradually transitioning into nondilated small bowel in the left lower quadrant, favored to represent reactive ileus. 3. 10 mm exophytic solid appearing lesion arising from the upper pole of the right kidney. Recommend further evaluation with nonemergent renal ultrasound. 4.  Aortic atherosclerosis (ICD10-I70.0). 5. 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 Critical Value/emergent results were called by telephone at the time of interpretation on 07/01/2017 at 8:58 pm to Dr. Eula Listen , who verbally acknowledged these results. Electronically Signed   By: Titus Dubin M.D.   On: 07/01/2017 21:01   Dg Abd Portable 1v  Result Date: 07/02/2017 CLINICAL DATA:  Check nasogastric catheter placement EXAM: PORTABLE ABDOMEN - 1 VIEW COMPARISON:  CT from 07/01/2017 FINDINGS: Nasogastric catheter is noted within the stomach. Colonic and small bowel gas is seen but incompletely evaluated. IMPRESSION: Nasogastric catheter within the stomach. Electronically Signed   By: Inez Catalina M.D.   On: 07/02/2017 11:13   ASSESSMENT AND PLAN:  71 y.o. male presented to [facility] ED tonight for abdominal pain. Patient reports his lower abdominal pain began 1.5  days ago (10/2) after he experienced acute on chronic intermittent constipation 3 days ago (10/1) for which patient took milk of magnesia twice after the first dose did not seem to help. He was found to have acute sigmoid diverticulitis with perforation  *Acute Sigmoid Diverticulitis with perforation - IV Abx and conservatiove management for now per surgery. - NGT with suction -Xarelto was held.Discussed with surgery Dr. Burt Knack who recommends patient has improved remarkably and okay to start oral anticoagulation back.  * HTN   resume coreg and Irbesartan    Rate and BP well controlled  * A fib  now resumed Xarelto after discussing with Dr cooper   * Hx of CAD Resume coreg and irbesartan.     Spoke with wife in the room  Case discussed with Care Management/Social Worker. Management plans discussed with the patient, family and they are in agreement.  CODE STATUS: FULL  DVT Prophylaxis: Xarelto  TOTAL TIME TAKING CARE OF THIS PATIENT: 30 minutes.  >50% time spent on counselling and coordination of care  POSSIBLE D/C IN 1-2 DAYS, DEPENDING ON CLINICAL CONDITION.  Note: This dictation was prepared with Dragon dictation along with smaller phrase technology. Any transcriptional errors that result from this process are unintentional.  Kordae Buonocore M.D on 07/03/2017 at 8:49 AM  Between 7am to 6pm - Pager - [phone]  After 6pm go to [email or web address] - password [email or web address] [facility]  Office  [phone]  CC: Primary care physician; Lillard Anes, MDPatient ID: Ryan Mccall, male   DOB: [birth year], 71 y.o.   MRN: [id number]

## 2017-07-03 NOTE — Anesthesia Procedure Notes (Signed)
Procedure Name: Intubation Date/Time: 07/03/2017 2:43 PM Performed by: Dionne Bucy Pre-anesthesia Checklist: Patient identified, Patient being monitored, Timeout performed, Emergency Drugs available and Suction available Patient Re-evaluated:Patient Re-evaluated prior to induction Oxygen Delivery Method: Circle system utilized Preoxygenation: Pre-oxygenation with 100% oxygen Induction Type: IV induction Ventilation: Mask ventilation without difficulty Laryngoscope Size: Mac and 4 Grade View: Grade I Tube type: Oral Tube size: 7.5 mm Number of attempts: 1 Airway Equipment and Method: Stylet Placement Confirmation: ETT inserted through vocal cords under direct vision,  positive ETCO2 and breath sounds checked- equal and bilateral Secured at: 23 cm Tube secured with: Tape Dental Injury: Teeth and Oropharynx as per pre-operative assessment

## 2017-07-03 NOTE — Progress Notes (Signed)
I visited patient after reviewing the current x-rays with radiology. He certainly has signs of a larger amount of free air on these newest films. He has required placement of the nasogastric tube for nausea and vomiting yesterday.  Today he states again that he feels better but his abdomen remains quite tender and distended.  With the findings above I am recommending exploratory laparotomy colon resection and colostomy. The rationale for this was discussed the option of continued antibiotic therapy was reviewed and the risks of bleeding infection recurrence wound infection and ostomy were all discussed with them they understood and agreed to proceed

## 2017-07-03 NOTE — Care Management Important Message (Signed)
Important Message  Patient Details  Name: Ryan Mccall MRN: [id number] Date of Birth: Feb 20, 1946   Medicare Important Message Given:  Yes    Beverly Sessions, RN 07/03/2017, 11:51 AM

## 2017-07-03 NOTE — Progress Notes (Signed)
Blood pressure 91/56  Bolus given

## 2017-07-03 NOTE — Transfer of Care (Signed)
Immediate Anesthesia Transfer of Care Note  Patient: Ryan Mccall  Procedure(s) Performed: COLECTOMY WITH COLOSTOMY CREATION/HARTMANN PROCEDURE (N/A Abdomen)  Patient Location: PACU  Anesthesia Type:General  Level of Consciousness: sedated  Airway & Oxygen Therapy: Patient Spontanous Breathing and Patient connected to face mask oxygen  Post-op Assessment: Report given to RN and Post -op Vital signs reviewed and stable  Post vital signs: Reviewed and stable  Last Vitals:  Vitals:   07/03/17 1408 07/03/17 1635  BP: 105/65 91/68  Pulse: 89 97  Resp: 18 (!) 21  Temp: 36.8 C (!) 35.9 C  SpO2: 95% 99%    Last Pain:  Vitals:   07/03/17 1408  TempSrc: Temporal  PainSc:       Patients Stated Pain Goal: 2 (52/48/18 5909)  Complications: No apparent anesthesia complications

## 2017-07-03 NOTE — Anesthesia Postprocedure Evaluation (Signed)
Anesthesia Post Note  Patient: Ryan Mccall  Procedure(s) Performed: COLECTOMY WITH COLOSTOMY CREATION/HARTMANN PROCEDURE (N/A Abdomen)  Patient location during evaluation: PACU Anesthesia Type: General Level of consciousness: awake and alert Pain management: pain level controlled Vital Signs Assessment: post-procedure vital signs reviewed and stable Respiratory status: spontaneous breathing, nonlabored ventilation, respiratory function stable and patient connected to nasal cannula oxygen Cardiovascular status: blood pressure returned to baseline and stable Postop Assessment: no apparent nausea or vomiting Anesthetic complications: no     Last Vitals:  Vitals:   07/03/17 2000 07/03/17 2005  BP: (!) 88/60 (!) 96/54  Pulse: 74   Resp: 15   Temp: 36.7 C   SpO2: 97%     Last Pain:  Vitals:   07/03/17 2019  TempSrc:   PainSc: Asleep                 Martha Clan

## 2017-07-03 NOTE — Anesthesia Preprocedure Evaluation (Signed)
Anesthesia Evaluation  Patient identified by MRN, date of birth, ID band Patient awake    Reviewed: Allergy & Precautions, NPO status , Patient's Chart, lab work & pertinent test results  History of Anesthesia Complications Negative for: history of anesthetic complications  Airway Mallampati: III  TM Distance: >3 FB Neck ROM: Full    Dental  (+) Edentulous Upper, Edentulous Lower   Pulmonary neg pulmonary ROS, Sleep apnea: not diagnosed, but wife feels like he probably does have sleep apnea. , neg COPD,    breath sounds clear to auscultation- rhonchi (-) wheezing      Cardiovascular hypertension, Pt. on medications + CAD and + CABG  (-) Cardiac Stents + dysrhythmias Atrial Fibrillation  Rhythm:Regular Rate:Normal - Systolic murmurs and - Diastolic murmurs NM stress test 04/11/16:  Nuclear stress EF: 54%. The LV systolic function is normal  There was no ST segment deviation noted during stress.  The study is normal. no ischemia. No infarction   Neuro/Psych negative neurological ROS  negative psych ROS   GI/Hepatic Neg liver ROS, Perforated diverticulitis    Endo/Other  negative endocrine ROSneg diabetes  Renal/GU negative Renal ROS     Musculoskeletal negative musculoskeletal ROS (+)   Abdominal (+) + obese,   Peds  Hematology negative hematology ROS (+)   Anesthesia Other Findings Past Medical History: No date: Atrial fibrillation (HCC) No date: Coronary artery disease     Comment:  s/p remote CABG 1999 per Dr. 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   Reproductive/Obstetrics                             Anesthesia Physical Anesthesia Plan  ASA: III  Anesthesia Plan: General   Post-op Pain Management:     Induction: Intravenous, Rapid sequence and Cricoid pressure planned  PONV Risk Score and Plan: 1 and Ondansetron and Dexamethasone  Airway Management Planned: Oral ETT  Additional Equipment:   Intra-op Plan:   Post-operative Plan: Extubation in OR  Informed Consent: I have reviewed the patients History and Physical, chart, labs and discussed the procedure including the risks, benefits and alternatives for the proposed anesthesia with the patient or authorized representative who has indicated his/her understanding and acceptance.   Dental advisory given  Plan Discussed with: CRNA and Anesthesiologist  Anesthesia Plan Comments:         Anesthesia Quick Evaluation

## 2017-07-03 NOTE — Anesthesia Post-op Follow-up Note (Signed)
Anesthesia QCDR form completed.        

## 2017-07-03 NOTE — Progress Notes (Signed)
Preoperative Review   Patient is met in the preoperative holding area. The history is reviewed in the chart and with the patient. I personally reviewed the options and rationale as well as the risks of this procedure that have been previously discussed with the patient. All questions asked by the patient and/or family were answered to their satisfaction.  Patient agrees to proceed with this procedure at this time.  Erico Stan E Kay Shippy M.D. FACS  

## 2017-07-04 LAB — CBC WITH DIFFERENTIAL/PLATELET
BASOS ABS: 0 10*3/uL (ref 0–0.1)
Basophils Relative: 0 %
Eosinophils Absolute: 0 10*3/uL (ref 0–0.7)
Eosinophils Relative: 0 %
HEMATOCRIT: 33.9 % — AB (ref 40.0–52.0)
HEMOGLOBIN: 11.9 g/dL — AB (ref 13.0–18.0)
LYMPHS ABS: 0.5 10*3/uL — AB (ref 1.0–3.6)
LYMPHS PCT: 5 %
MCH: 30.8 pg (ref 26.0–34.0)
MCHC: 35.1 g/dL (ref 32.0–36.0)
MCV: 87.8 fL (ref 80.0–100.0)
Monocytes Absolute: 0.7 10*3/uL (ref 0.2–1.0)
Monocytes Relative: 7 %
NEUTROS ABS: 8.1 10*3/uL — AB (ref 1.4–6.5)
Neutrophils Relative %: 88 %
Platelets: 171 10*3/uL (ref 150–440)
RBC: 3.87 MIL/uL — AB (ref 4.40–5.90)
RDW: 14.6 % — AB (ref 11.5–14.5)
WBC: 9.2 10*3/uL (ref 3.8–10.6)

## 2017-07-04 LAB — BASIC METABOLIC PANEL
ANION GAP: 5 (ref 5–15)
BUN: 27 mg/dL — ABNORMAL HIGH (ref 6–20)
CALCIUM: 7.9 mg/dL — AB (ref 8.9–10.3)
CO2: 30 mmol/L (ref 22–32)
Chloride: 102 mmol/L (ref 101–111)
Creatinine, Ser: 0.82 mg/dL (ref 0.61–1.24)
GFR calc non Af Amer: >60 mL/min (ref >60)
Glucose, Bld: 163 mg/dL — ABNORMAL HIGH (ref 65–99)
POTASSIUM: 4.5 mmol/L (ref 3.5–5.1)
Sodium: 137 mmol/L (ref 135–145)

## 2017-07-04 MED ORDER — METOPROLOL TARTRATE 5 MG/5ML IV SOLN
2.5000 mg | Freq: Two times a day (BID) | INTRAVENOUS | Status: DC
  Filled 2017-07-04: qty 5

## 2017-07-04 NOTE — Progress Notes (Signed)
Ryan Mccall NAME: Ryan Mccall    MR#:  [id number]  DATE OF BIRTH:  [birth year]  SUBJECTIVE:   Still has abdominal pain. NG tube in place. At [facility] procedure on 07/03/2017.  REVIEW OF SYSTEMS:   Review of Systems  Constitutional: Negative for chills, fever and weight loss.  HENT: Negative for ear discharge, ear pain and nosebleeds.   Eyes: Negative for blurred vision, pain and discharge.  Respiratory: Negative for sputum production, shortness of breath, wheezing and stridor.   Cardiovascular: Negative for chest pain, palpitations, orthopnea and PND.  Gastrointestinal: Positive for abdominal pain, nausea and vomiting. Negative for diarrhea.  Genitourinary: Negative for frequency and urgency.  Musculoskeletal: Negative for back pain and joint pain.  Neurological: Positive for weakness. Negative for sensory change, speech change and focal weakness.  Psychiatric/Behavioral: Negative for depression and hallucinations. The patient is not nervous/anxious.    Tolerating Diet:npo  DRUG ALLERGIES:   Allergies  Allergen Reactions  . Shellfish Allergy     VITALS:  Blood pressure 101/71, pulse (!) 58, temperature 98 F (36.7 C), temperature source Oral, resp. rate 11, height 5\' 9"  (1.753 m), weight 107 kg (236 lb), SpO2 97 %.  PHYSICAL EXAMINATION:   Physical Exam  GENERAL:  71 y.o.-year-old patient lying in the bed with no acute distress. obese EYES: Pupils equal, round, reactive to [place] and accommodation. No scleral icterus. Extraocular muscles intact.  HEENT: Head atraumatic, normocephalic. Oropharynx and nasopharynx clear.  NECK:  Supple, no jugular venous distention. No thyroid enlargement, no tenderness.  LUNGS: Normal breath sounds bilaterally, no wheezing, rales, rhonchi. No use of accessory muscles of respiration.  CARDIOVASCULAR: S1, S2 normal. No murmurs, rubs, or gallops.  ABDOMEN: distended. Tender diffusely.  Bowel sounds absent. Dressing over her surgical wound. EXTREMITIES: No cyanosis, clubbing or edema b/l.    NEUROLOGIC: Cranial nerves II through XII are intact. No focal Motor or sensory deficits b/l.   PSYCHIATRIC:  patient is alert and oriented x 3.  SKIN: No obvious rash, lesion, or ulcer.   LABORATORY PANEL:  CBC  Recent Labs Lab 07/04/17 0435  WBC 9.2  HGB 11.9*  HCT 33.9*  PLT 171    Chemistries   Recent Labs Lab 07/01/17 1607  07/04/17 0435  NA 132*  < > 137  K 4.5  < > 4.5  CL 100*  < > 102  CO2 24  < > 30  GLUCOSE 138*  < > 163*  BUN 27*  < > 27*  CREATININE 0.90  < > 0.82  CALCIUM 9.3  < > 7.9*  AST 25  --   --   ALT 21  --   --   ALKPHOS 45  --   --   BILITOT 3.1*  --   --   < > = values in this interval not displayed. Cardiac Enzymes No results for input(s): TROPONINI in the last 168 hours. RADIOLOGY:  Dg Abd 2 Views  Result Date: 07/03/2017 CLINICAL DATA:  Follow-up ileus EXAM: ABDOMEN - 2 VIEW COMPARISON:  07/01/2017 FINDINGS: Pneumoperitoneum is identified. This is significantly increased when compared with CT from 07/01/2017. Nasogastric tube tip is in the distal stomach with side port well below GE junction. Colonic ileus pattern is unchanged from previous exam. Enteric contrast material is identified within the proximal right colon. The dilated small bowel loops now measure up to 6 cm IMPRESSION: 1. Free intraperitoneal air is identified. Significantly increased from  07/01/2017. 2. Progressive increase in caliber of small bowel loops compatible with either distal bowel obstruction secondary to diverticulitis versus paralytic ileus. Electronically Signed   By: Ryan Mccall M.D.   On: 07/03/2017 10:35   ASSESSMENT AND PLAN:  71 y.o. male presented to [facility] ED tonight for abdominal pain. Patient reports his lower abdominal pain began 1.5 days ago (10/2) after he experienced acute on chronic intermittent constipation 3 days ago (10/1) for which patient  took milk of magnesia twice after the first dose did not seem to help. He was found to have acute sigmoid diverticulitis with perforation  *Acute Sigmoid Diverticulitis with perforation S/p Exploratory laparotomy and sigmoid colon resection colostomy (Hartman's procedure) NG tube in place today Xarelto held  * Hypertension Discontinued Avapro. Hold today morning dose of Coreg. We'll discontinue scheduled IV Lopressor.  * A fib  now resumed Xarelto after discussing with Dr Burt Knack   * Hx of CAD stable    Spoke with patient and wife at bedside  Case discussed with Care Management/Social Worker. Management plans discussed with the patient, family and they are in agreement.  CODE STATUS: FULL  DVT Prophylaxis: SCDs  TOTAL TIME TAKING CARE OF THIS PATIENT: 30 minutes.   POSSIBLE D/C IN 1-2 DAYS, DEPENDING ON CLINICAL CONDITION.  Note: This dictation was prepared with Dragon dictation along with smaller phrase technology. Any transcriptional errors that result from this process are unintentional.  Ryan Mccall M.D on 07/04/2017 at 12:32 PM  Between 7am to 6pm - Pager - [phone]  After 6pm go to [email or web address] - password [email or web address] [facility]  Office  [phone]  CC: Primary care physician; Ryan Mccall, MDPatient ID: Ryan Mccall, male   DOB: Sep 14, 1946, 71 y.o.   MRN: [id number]

## 2017-07-04 NOTE — Progress Notes (Signed)
Foley was removed per order at 1350. Offered to assist patient with getting up to the chair. Patient declined at this time. Discussed getting out of bed and walking with patient and his wife. Both verbalized understanding. Patient stated that he would try later. Will continue to encourage.

## 2017-07-04 NOTE — Progress Notes (Signed)
1 Day Post-Op  Subjective: Status post Hartman's procedure for perforated diverticulitis. Patient feels better now but has incisional pain. He still has some mild nausea and a nasogastric tube is in place.  Objective: Vital signs in last 24 hours: Temp:  [96.7 F (35.9 C)-98.2 F (36.8 C)] 98 F (36.7 C) (10/06 0511) Pulse Rate:  [58-97] 58 (10/06 0511) Resp:  [11-22] 11 (10/06 0800) BP: (88-114)/(53-73) 97/56 (10/06 0511) SpO2:  [93 %-100 %] 97 % (10/06 0800) FiO2 (%):  [96 %] 96 % (10/05 1844) Weight:  [236 lb (107 kg)] 236 lb (107 kg) (10/05 1408) Last BM Date: 06/30/17  Intake/Output from previous day: 10/05 0701 - 10/06 0700 In: 4360.5 [I.V.:3979.3; IV Piggyback:381.3] Out: 1280 [Urine:500; Emesis/NG output:300; Drains:180; Blood:300] Intake/Output this shift: Total I/O In: 508.3 [I.V.:508.3] Out: 100 [Emesis/NG output:100]  Physical exam:  Vital signs are stable afebrile. Nasogastric tube in place. Abdomen is distended protuberant and obese minimally tender wound is clean drains are in place. Ostomy appears slightly dusky with no ostomy output. Calves are nontender   Lab Results: CBC   Recent Labs  07/03/17 0519 07/04/17 0435  WBC 11.5* 9.2  HGB 13.3 11.9*  HCT 37.5* 33.9*  PLT 179 171   BMET  Recent Labs  07/02/17 0208 07/04/17 0435  NA 132* 137  K 4.1 4.5  CL 99* 102  CO2 26 30  GLUCOSE 143* 163*  BUN 28* 27*  CREATININE 0.88 0.82  CALCIUM 8.3* 7.9*   PT/INR  Recent Labs  07/01/17 2116  LABPROT 22.4*  INR 1.99   ABG No results for input(s): PHART, HCO3 in the last 72 hours.  Invalid input(s): PCO2, PO2  Studies/Results: Dg Abd 2 Views  Result Date: 07/03/2017 CLINICAL DATA:  Follow-up ileus EXAM: ABDOMEN - 2 VIEW COMPARISON:  07/01/2017 FINDINGS: Pneumoperitoneum is identified. This is significantly increased when compared with CT from 07/01/2017. Nasogastric tube tip is in the distal stomach with side port well below GE junction.  Colonic ileus pattern is unchanged from previous exam. Enteric contrast material is identified within the proximal right colon. The dilated small bowel loops now measure up to 6 cm IMPRESSION: 1. Free intraperitoneal air is identified. Significantly increased from 07/01/2017. 2. Progressive increase in caliber of small bowel loops compatible with either distal bowel obstruction secondary to diverticulitis versus paralytic ileus. Electronically Signed   By: Kerby Moors M.D.   On: 07/03/2017 10:35    Anti-infectives: Anti-infectives    Start     Dose/Rate Route Frequency Ordered Stop   07/02/17 0200  piperacillin-tazobactam (ZOSYN) IVPB 3.375 g     3.375 g 12.5 mL/hr over 240 Minutes Intravenous Every 8 hours 07/02/17 0150     07/01/17 2115  piperacillin-tazobactam (ZOSYN) IVPB 3.375 g     3.375 g 100 mL/hr over 30 Minutes Intravenous  Once 07/01/17 2103 07/01/17 2236      Assessment/Plan: s/p Procedure(s): COLECTOMY WITH COLOSTOMY CREATION/HARTMANN PROCEDURE   Normal white blood cell count normal labs  Patient doing well following a Hartman's procedure for perforated diverticulitis. The ostomy appears slightly dusky but is probably viable. If it does not improve endoscopy may be necessary. At this point I would continue the nasogastric tube as he has significant ileus but discontinue his Foley catheter.  Florene Glen, MD, FACS  07/04/2017

## 2017-07-05 LAB — BASIC METABOLIC PANEL
ANION GAP: 6 (ref 5–15)
BUN: 24 mg/dL — ABNORMAL HIGH (ref 6–20)
CALCIUM: 8 mg/dL — AB (ref 8.9–10.3)
CHLORIDE: 99 mmol/L — AB (ref 101–111)
CO2: 33 mmol/L — AB (ref 22–32)
Creatinine, Ser: 0.8 mg/dL (ref 0.61–1.24)
GFR calc Af Amer: >60 mL/min (ref >60)
GFR calc non Af Amer: >60 mL/min (ref >60)
GLUCOSE: 130 mg/dL — AB (ref 65–99)
Potassium: 4.4 mmol/L (ref 3.5–5.1)
Sodium: 138 mmol/L (ref 135–145)

## 2017-07-05 MED ORDER — METOPROLOL TARTRATE 5 MG/5ML IV SOLN
2.5000 mg | Freq: Two times a day (BID) | INTRAVENOUS | Status: DC
  Administered 2017-07-05 – 2017-07-11 (×13): 2.5 mg via INTRAVENOUS
  Filled 2017-07-05 (×13): qty 5

## 2017-07-05 NOTE — Progress Notes (Signed)
Called Dr. Darvin Neighbours to clarify patient's BP medications.  Patient had both Carvedilol and Metoprolol ordered and patient is NPO.  He acknowledged and said he would discontinue to Carvedilol.

## 2017-07-05 NOTE — Progress Notes (Signed)
[place] at [place] NAME: Ryan Mccall    MR#:  [id number]  DATE OF BIRTH:  [birth year]  SUBJECTIVE:   Still has abdominal pain.  NG tube in place.   Hartman's procedure on 07/03/2017.  REVIEW OF SYSTEMS:   Review of Systems  Constitutional: Negative for chills, fever and weight loss.  HENT: Negative for ear discharge, ear pain and nosebleeds.   Eyes: Negative for blurred vision, pain and discharge.  Respiratory: Negative for sputum production, shortness of breath, wheezing and stridor.   Cardiovascular: Negative for chest pain, palpitations, orthopnea and PND.  Gastrointestinal: Positive for abdominal pain, nausea and vomiting. Negative for diarrhea.  Genitourinary: Negative for frequency and urgency.  Musculoskeletal: Negative for back pain and joint pain.  Neurological: Positive for weakness. Negative for sensory change, speech change and focal weakness.  Psychiatric/Behavioral: Negative for depression and hallucinations. The patient is not nervous/anxious.    Tolerating Diet:npo  DRUG ALLERGIES:   Allergies  Allergen Reactions  . Shellfish Allergy     VITALS:  Blood pressure 119/71, pulse 74, temperature 97.8 F (36.6 C), temperature source Oral, resp. rate 18, height 5\' 9"  (1.753 m), weight 107 kg (236 lb), SpO2 98 %.  PHYSICAL EXAMINATION:   Physical Exam  GENERAL:  71 y.o.-year-old patient lying in the bed with no acute distress. obese EYES: Pupils equal, round, reactive to [place] and accommodation. No scleral icterus. Extraocular muscles intact.  HEENT: Head atraumatic, normocephalic. Oropharynx and nasopharynx clear.  NECK:  Supple, no jugular venous distention. No thyroid enlargement, no tenderness.  LUNGS: Normal breath sounds bilaterally, no wheezing, rales, rhonchi. No use of accessory muscles of respiration.  CARDIOVASCULAR: S1, S2 normal. No murmurs, rubs, or gallops.  ABDOMEN: distended. Tender diffusely.  Bowel sounds absent. Dressing over her surgical wound. EXTREMITIES: No cyanosis, clubbing or edema b/l.    NEUROLOGIC: Cranial nerves II through XII are intact. No focal Motor or sensory deficits b/l.   PSYCHIATRIC:  patient is alert and oriented x 3.  SKIN: No obvious rash, lesion, or ulcer.   LABORATORY PANEL:  CBC  Recent Labs Lab 07/04/17 0435  WBC 9.2  HGB 11.9*  HCT 33.9*  PLT 171    Chemistries   Recent Labs Lab 07/01/17 1607  07/05/17 0353  NA 132*  < > 138  K 4.5  < > 4.4  CL 100*  < > 99*  CO2 24  < > 33*  GLUCOSE 138*  < > 130*  BUN 27*  < > 24*  CREATININE 0.90  < > 0.80  CALCIUM 9.3  < > 8.0*  AST 25  --   --   ALT 21  --   --   ALKPHOS 45  --   --   BILITOT 3.1*  --   --   < > = values in this interval not displayed. Cardiac Enzymes No results for input(s): TROPONINI in the last 168 hours. RADIOLOGY:  No results found. ASSESSMENT AND PLAN:  71 y.o. male presented to [facility] ED tonight for abdominal pain. Patient reports his lower abdominal pain began 1.5 days ago (10/2) after he experienced acute on chronic intermittent constipation 3 days ago (10/1) for which patient took milk of magnesia twice after the first dose did not seem to help. He was found to have acute sigmoid diverticulitis with perforation  *Acute Sigmoid Diverticulitis with perforation S/p Exploratory laparotomy and sigmoid colon resection colostomy (Hartman's procedure) NG tube in place  Xarelto held  * Hypertension Discontinued Avapro and coreg. On IV lopressor  * A fib  Resume xarelto when able to take pills and no bleeding  * Hx of CAD stable    Spoke with patient and wife at bedside  Case discussed with Care Management/Social Worker. Management plans discussed with the patient, family and they are in agreement.  CODE STATUS: FULL  DVT Prophylaxis: SCDs  TOTAL TIME TAKING CARE OF THIS PATIENT: 30 minutes.   Note: This dictation was prepared with Dragon dictation  along with smaller phrase technology. Any transcriptional errors that result from this process are unintentional.  Hillary Bow R M.D on 07/05/2017 at 11:05 AM  Between 7am to 6pm - Pager - [phone]  After 6pm go to [email or web address] - password [email or web address] [facility]  Office  [phone]  CC: Primary care physician; Lillard Anes, MDPatient ID: Ryan Mccall, male   DOB: December 05, 1945, 71 y.o.   MRN: [id number]

## 2017-07-05 NOTE — Progress Notes (Signed)
2 Days Post-Op  Subjective: Status post Hartman's procedure. Patient feeling better every day. He has not passed any gas or stool into his bag yet.  Objective: Vital signs in last 24 hours: Temp:  [97.6 F (36.4 C)-98.2 F (36.8 C)] 98 F (36.7 C) (10/07 0451) Pulse Rate:  [58-76] 68 (10/07 0451) Resp:  [12-20] 16 (10/07 0451) BP: (101-119)/(67-77) 119/77 (10/07 0451) SpO2:  [96 %-100 %] 100 % (10/07 0451) Last BM Date: 06/30/17  Intake/Output from previous day: 10/06 0701 - 10/07 0700 In: 3537.5 [I.V.:3187.5; NG/GT:350] Out: 1935 [Urine:1175; Emesis/NG output:700; Drains:60] Intake/Output this shift: No intake/output data recorded.  Physical exam:  Obese and protuberant abdomen with Penrose drains in place no erythema no drainage ostomy is less dusky today but not yet functional. Nontender calves  Lab Results: CBC   Recent Labs  07/03/17 0519 07/04/17 0435  WBC 11.5* 9.2  HGB 13.3 11.9*  HCT 37.5* 33.9*  PLT 179 171   BMET  Recent Labs  07/04/17 0435 07/05/17 0353  NA 137 138  K 4.5 4.4  CL 102 99*  CO2 30 33*  GLUCOSE 163* 130*  BUN 27* 24*  CREATININE 0.82 0.80  CALCIUM 7.9* 8.0*   PT/INR No results for input(s): LABPROT, INR in the last 72 hours. ABG No results for input(s): PHART, HCO3 in the last 72 hours.  Invalid input(s): PCO2, PO2  Studies/Results: Dg Abd 2 Views  Result Date: 07/03/2017 CLINICAL DATA:  Follow-up ileus EXAM: ABDOMEN - 2 VIEW COMPARISON:  07/01/2017 FINDINGS: Pneumoperitoneum is identified. This is significantly increased when compared with CT from 07/01/2017. Nasogastric tube tip is in the distal stomach with side port well below GE junction. Colonic ileus pattern is unchanged from previous exam. Enteric contrast material is identified within the proximal right colon. The dilated small bowel loops now measure up to 6 cm IMPRESSION: 1. Free intraperitoneal air is identified. Significantly increased from 07/01/2017. 2.  Progressive increase in caliber of small bowel loops compatible with either distal bowel obstruction secondary to diverticulitis versus paralytic ileus. Electronically Signed   By: Kerby Moors M.D.   On: 07/03/2017 10:35    Anti-infectives: Anti-infectives    Start     Dose/Rate Route Frequency Ordered Stop   07/02/17 0200  piperacillin-tazobactam (ZOSYN) IVPB 3.375 g     3.375 g 12.5 mL/hr over 240 Minutes Intravenous Every 8 hours 07/02/17 0150     07/01/17 2115  piperacillin-tazobactam (ZOSYN) IVPB 3.375 g     3.375 g 100 mL/hr over 30 Minutes Intravenous  Once 07/01/17 2103 07/01/17 2236      Assessment/Plan: s/p Procedure(s): COLECTOMY WITH COLOSTOMY CREATION/HARTMANN PROCEDURE   Mild ischemia of the ostomy site mucosa but appears to be better than yesterday. We'll continue to observe. I did not do a glass tube endoscopy as the condition of the ostomy is improved today compared to yesterday. Awaiting function. With the patient's distended small bowel I would leave his nasogastric tube and in until he is passing gas in the bag  reliably.  Florene Glen, MD, FACS  07/05/2017

## 2017-07-06 ENCOUNTER — Inpatient Hospital Stay: Payer: PPO

## 2017-07-06 ENCOUNTER — Encounter: Payer: Self-pay | Admitting: Surgery

## 2017-07-06 NOTE — Care Management (Signed)
Patient  NPO with nasogastric tube. Is not passing gas. Wound care is consulting for ostomy care.  Have reached out to [facility] Glad regarding enrollment in the [facility] discharge program

## 2017-07-06 NOTE — Progress Notes (Addendum)
[facility] Hospital Day(s): 4.   Post op day(s): 3 Days Post-Op.   Interval History: Patient seen and examined, no acute events or new complaints overnight. Patient reports ambulation and that his pain has been well-controlled since surgery. He otherwise denies N/V, fever/chills, CP, or SOB.  Review of Systems:  Constitutional: denies fever, chills  HEENT: denies cough or congestion  Respiratory: denies any shortness of breath  Cardiovascular: denies chest pain or palpitations  Gastrointestinal: abdominal pain, N/V, and bowel function as per interval history Genitourinary: denies burning with urination or urinary frequency Musculoskeletal: denies pain, decreased motor or sensation Integumentary: denies any other rashes or skin discolorations except post-surgical abdominal wounds Neurological: denies HA or vision/hearing changes   Vital signs in last 24 hours: [min-max] current  Temp:  [97.6 F (36.4 C)-98.2 F (36.8 C)] 97.6 F (36.4 C) (10/08 0441) Pulse Rate:  [63-87] 82 (10/08 0441) Resp:  [11-22] 18 (10/08 0441) BP: (111-130)/(66-75) 130/75 (10/08 0441) SpO2:  [96 %-100 %] 99 % (10/08 0441)     Height: 5\' 9"  (175.3 cm) Weight: 236 lb (107 kg) BMI (Calculated): 34.84   Intake/Output this shift:  No intake/output data recorded.   Intake/Output last 2 shifts:  @IOLAST2SHIFTS @   Physical Exam:  Constitutional: alert, cooperative and no distress  HENT: normocephalic without obvious abnormality  Eyes: PERRL, EOM's grossly intact and symmetric  Neuro: CN II - XII grossly intact and symmetric without deficit  Respiratory: breathing non-labored at rest  Cardiovascular: regular rate and sinus rhythm  Gastrointestinal: soft, obese, and only minimal peri-incisional tenderness to palpation, serosanguinous drainage from base of vertical midline laparotomy wound dressed with dry gauze and ABD, no surrounding erythema, bilious drainage from NG tube, no ostomy function,  ostomy somewhat dusky but unchanged per report Musculoskeletal: UE and LE FROM, motor and sensation grossly intact, NT   Labs:  CBC Latest Ref Rng & Units 07/04/2017 07/03/2017 07/02/2017  WBC 3.8 - 10.6 K/uL 9.2 11.5(H) 13.5(H)  Hemoglobin 13.0 - 18.0 g/dL 11.9(L) 13.3 13.1  Hematocrit 40.0 - 52.0 % 33.9(L) 37.5(L) 38.1(L)  Platelets 150 - 440 K/uL 171 179 162   CMP Latest Ref Rng & Units 07/05/2017 07/04/2017 07/02/2017  Glucose 65 - 99 mg/dL 130(H) 163(H) 143(H)  BUN 6 - 20 mg/dL 24(H) 27(H) 28(H)  Creatinine 0.61 - 1.24 mg/dL 0.80 0.82 0.88  Sodium 135 - 145 mmol/L 138 137 132(L)  Potassium 3.5 - 5.1 mmol/L 4.4 4.5 4.1  Chloride 101 - 111 mmol/L 99(L) 102 99(L)  CO2 22 - 32 mmol/L 33(H) 30 26  Calcium 8.9 - 10.3 mg/dL 8.0(L) 7.9(L) 8.3(L)  Total Protein 6.5 - 8.1 g/dL - - -  Total Bilirubin 0.3 - 1.2 mg/dL - - -  Alkaline Phos 38 - 126 U/L - - -  AST 15 - 41 U/L - - -  ALT 17 - 63 U/L - - -   Imaging studies: No new pertinent imaging studies   Assessment/Plan: (ICD-10's: K9.20) 71 y.o. 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   - NPO, IVF, IV antibiotics  - pain control prn, minimize narcotics  - continue nasogastric decompression for now  - monitor abdominal exam and ostomy function and viability   -  medical management of medical comorbidities  - DVT prophylaxis, ambulation encouraged  - follow-up pending renal ultrasound  All of the above findings and recommendations were discussed with the patient, patient's family, and patient's RN, and all of patient's and family's questions were answered to their expressed  satisfaction.  -- Marilynne Drivers Rosana Hoes, MD, [place]: [facility] - Partnering for exceptional care. Office: [phone]

## 2017-07-06 NOTE — Consult Note (Signed)
[place] Nurse ostomy consult note Stoma type/location: LLQ colostomy  Wife at bedside and states she will be caring for him at home.  They have a daughter that has cared for someone with an ostomy in the past and will be a resource at home.  Stomal assessment/size: 1 5/8" slightly oval. Edematous Will use barrier ring Peristomal assessment: intact.  Skin fold at 3 o'clock.  Will fill in with barrier ring Treatment options for stomal/peristomal skin: barrier ring Output Blood tinged liquid at this time.  NG tube in place Ostomy pouching:2pc. 2 3/4" pouch.  Cut off center to accommodate midline surgical staple line  Education provided: Discussed measuring and cutting with wife. To measure at each pouch change for at least six weeks.  Demonstrated pouch change and use of barrier ring.  Recommended pouch changes twice weekly and how to check for leaks.  Discussed showering and activity questions.   Enrolled patient in [employer] [place] program: No  MAy want to consider one piece pouch if 2 piece is not flexible enough.  [facility] team will follow and remain available to patient, medical and nursing teams.  Domenic Moras RN BSN [place] Pager [phone]

## 2017-07-06 NOTE — Progress Notes (Signed)
[place] at [place] NAME: Ryan Mccall    MR#:  [id number]  DATE OF BIRTH:  [birth year]  SUBJECTIVE:   Still has abdominal pain. No SOB  NG tube in place.   Hartman's procedure on 07/03/2017.  REVIEW OF SYSTEMS:   Review of Systems  Constitutional: Negative for chills, fever and weight loss.  HENT: Negative for ear discharge, ear pain and nosebleeds.   Eyes: Negative for blurred vision, pain and discharge.  Respiratory: Negative for sputum production, shortness of breath, wheezing and stridor.   Cardiovascular: Negative for chest pain, palpitations, orthopnea and PND.  Gastrointestinal: Positive for abdominal pain, nausea and vomiting. Negative for diarrhea.  Genitourinary: Negative for frequency and urgency.  Musculoskeletal: Negative for back pain and joint pain.  Neurological: Positive for weakness. Negative for sensory change, speech change and focal weakness.  Psychiatric/Behavioral: Negative for depression and hallucinations. The patient is not nervous/anxious.     DRUG ALLERGIES:   Allergies  Allergen Reactions  . Shellfish Allergy     VITALS:  Blood pressure 130/75, pulse 82, temperature 97.6 F (36.4 C), temperature source Oral, resp. rate 13, height 5\' 9"  (1.753 m), weight 107 kg (236 lb), SpO2 96 %.  PHYSICAL EXAMINATION:   Physical Exam  GENERAL:  71 y.o.-year-old patient lying in the bed with no acute distress. obese EYES: Pupils equal, round, reactive to [place] and accommodation. No scleral icterus. Extraocular muscles intact.  HEENT: Head atraumatic, normocephalic. Oropharynx and nasopharynx clear.  NECK:  Supple, no jugular venous distention. No thyroid enlargement, no tenderness.  LUNGS: Normal breath sounds bilaterally, no wheezing, rales, rhonchi. No use of accessory muscles of respiration.  CARDIOVASCULAR: S1, S2 normal. No murmurs, rubs, or gallops.  ABDOMEN: distended. Tender diffusely. Bowel sounds  absent. Dressing over her surgical wound. EXTREMITIES: No cyanosis, clubbing or edema b/l.    NEUROLOGIC: Cranial nerves II through XII are intact. No focal Motor or sensory deficits b/l.   PSYCHIATRIC:  patient is alert and oriented x 3.  SKIN: No obvious rash, lesion, or ulcer.   LABORATORY PANEL:  CBC  Recent Labs Lab 07/04/17 0435  WBC 9.2  HGB 11.9*  HCT 33.9*  PLT 171    Chemistries   Recent Labs Lab 07/01/17 1607  07/05/17 0353  NA 132*  < > 138  K 4.5  < > 4.4  CL 100*  < > 99*  CO2 24  < > 33*  GLUCOSE 138*  < > 130*  BUN 27*  < > 24*  CREATININE 0.90  < > 0.80  CALCIUM 9.3  < > 8.0*  AST 25  --   --   ALT 21  --   --   ALKPHOS 45  --   --   BILITOT 3.1*  --   --   < > = values in this interval not displayed. Cardiac Enzymes No results for input(s): TROPONINI in the last 168 hours. RADIOLOGY:  US Renal  Result Date: 07/06/2017 CLINICAL DATA:  Right renal mass EXAM: RENAL / URINARY TRACT ULTRASOUND COMPLETE COMPARISON:  Noncontrast abdominal CT scan of July 01, 2017 FINDINGS: Right Kidney: Length: 13.7 cm. The renal cortical echotexture remains lower than that of the liver. There is a hypoechoic structure in the upper pole measuring 1.9 x 1.4 x 1.7. It is partially exophytic. There is no hydronephrosis. Left Kidney: Length: 14.2 cm. Echogenicity within normal limits. No mass or hydronephrosis visualized. Bladder: Limited visualization due to  bandage material from abdominal surgery. Appears normal for degree of bladder distention. Incidental note is made of sludge within the gallbladder. IMPRESSION: 1.9 x 1.4 x 1.7 cm exophytic hypoechoic structure not clearly reflecting a simple cyst. Use located in the right upper pole cortex. Further evaluation with hepatic protocol MRI is recommended when the patient can tolerate the procedure. Normal appearance of the left kidney and visualized portions of the urinary bladder. Incidental note is made of sludge within the  gallbladder. Electronically Signed   By: David  [name] M.D.   On: 07/06/2017 10:22   ASSESSMENT AND PLAN:  71 y.o. male presented to [facility] ED tonight for abdominal pain. Patient reports his lower abdominal pain began 1.5 days ago (10/2) after he experienced acute on chronic intermittent constipation 3 days ago (10/1) for which patient took milk of magnesia twice after the first dose did not seem to help. He was found to have acute sigmoid diverticulitis with perforation  *Acute Sigmoid Diverticulitis with perforation S/p Exploratory laparotomy and sigmoid colon resection colostomy (Hartman's procedure) NG tube in place  Xarelto held  * Hypertension - well controlled Discontinued Avapro and coreg. On IV lopressor  * A fib  Resume xarelto per surgery when able  * Hx of CAD stable    Spoke with patient and wife at bedside  Case discussed with Care Management/Social Worker. Management plans discussed with the patient, family and they are in agreement.  CODE STATUS: FULL  DVT Prophylaxis: SCDs  TOTAL TIME TAKING CARE OF THIS PATIENT: 30 minutes.   Note: This dictation was prepared with Dragon dictation along with smaller phrase technology. Any transcriptional errors that result from this process are unintentional.  Hillary Bow R M.D on 07/06/2017 at 12:32 PM  Between 7am to 6pm - Pager - [phone]  After 6pm go to [email or web address] - password [email or web address] [facility]  Office  [phone]  CC: Primary care physician; Lillard Anes, MDPatient ID: [name], male   DOB: [birth year], 71 y.o.   MRN: [id number]

## 2017-07-07 ENCOUNTER — Inpatient Hospital Stay: Payer: PPO

## 2017-07-07 LAB — SURGICAL PATHOLOGY

## 2017-07-07 MED ORDER — PHENOL 1.4 % MT LIQD
1.0000 | OROMUCOSAL | Status: DC | PRN
  Administered 2017-07-07: 1 via OROMUCOSAL
  Filled 2017-07-07: qty 177

## 2017-07-07 MED ORDER — MORPHINE SULFATE (PF) 2 MG/ML IV SOLN: 2.0000 mg | INTRAVENOUS | Status: DC | PRN

## 2017-07-07 MED ORDER — ORAL CARE MOUTH RINSE
15.0000 mL | Freq: Two times a day (BID) | OROMUCOSAL | Status: DC
  Administered 2017-07-07 – 2017-07-11 (×9): 15 mL via OROMUCOSAL

## 2017-07-07 MED ORDER — CHLORHEXIDINE GLUCONATE 0.12 % MT SOLN
15.0000 mL | Freq: Two times a day (BID) | OROMUCOSAL | Status: DC
  Administered 2017-07-07 – 2017-07-11 (×9): 15 mL via OROMUCOSAL
  Filled 2017-07-07 (×9): qty 15

## 2017-07-07 MED ORDER — SODIUM CHLORIDE 0.9% FLUSH
10.0000 mL | Freq: Two times a day (BID) | INTRAVENOUS | Status: DC
  Administered 2017-07-07 – 2017-07-11 (×6): 10 mL
  Administered 2017-07-11: 20 mL
  Administered 2017-07-12 – 2017-07-14 (×4): 10 mL

## 2017-07-07 MED ORDER — SODIUM CHLORIDE 0.9% FLUSH
10.0000 mL | INTRAVENOUS | Status: DC | PRN
  Administered 2017-07-14: 10 mL
  Filled 2017-07-07: qty 40

## 2017-07-07 NOTE — Progress Notes (Addendum)
Initial Nutrition Assessment  DOCUMENTATION CODES:   Obesity unspecified  INTERVENTION:   Recommend TPN as pt without adequate nutrition for > 7 days  If TPN initiated recommend Clinimix 5/20 with electrolytes at 26m/hr + 20% lipids '@20ml'$ /hr x 12 hrs/day (Goal rate 816mhr once labs stable)  Regimen provides 1345kcal/day, 49g/day protein, 122438molume  Add MVI daily   Add IV thiamine '100mg'$  daily   Pt at high refeeding risk; recommend check P, K, and Mg daily for 3 days after TPN iniatition.   Daily weights  Recommend discontinue IVF  NUTRITION DIAGNOSIS:   Inadequate oral intake related to acute illness as evidenced by NPO status.  GOAL:   Patient will meet greater than or equal to 90% of their needs  MONITOR:   Diet advancement, Labs, Weight trends, I & O's  REASON FOR ASSESSMENT:   NPO/Clear Liquid Diet    ASSESSMENT:   71 56o. male with perforated sigmoid colonic diverticulitis with pneumoperitoneum now s/p Hartman's partial colectomy with creation of end colostomy 10/62/8omplicated by pertinent comorbidities including chronic Xarelto anticoagulation for atrial fibrillation without prior embolic event, CAD s/p CABG (1999) for ACS without MI, HTN, hypercholesterolemia, and squamous cell carcinoma of abdominal wall.   Met with pt and pt's wife in room today. Pt reports that he has not eaten any food since 8/1 at dinner. Prior to that, pt was eating well. Pt has now been without adequate nutrition for >7 days. Recommend initiation of TPN. Pt with NGT in place and on suction. Pt reports that he is not feeling any better today and that his abdomen is more distended. Pt reports no flatus or BMs. Pt reports his UBW of 238lbs; per chart, pt has lost 7lbs(3%) but RD is unsure of time frame. Pt is at high refeeding risk; monitor labs. Pt reports that he does drink Ensure and will drink these when diet advanced.   Medications reviewed and include: hydromorphone, 5% Dextrose  '@125ml'$ /hr, zosyn, zofran  Labs reviewed: K 4.4 wnl, Cl 99(L), CO2 33(H), BUN 24(H), Ca 8.0(L)  Nutrition-Focused physical exam completed. Findings are no fat depletion, no muscle depletion, and no edema.   Diet Order:  Diet NPO time specified Except for: Ice Chips  Skin:  Wound (see comment) (incision abdomen )  Last BM:  10/2  Height:   Ht Readings from Last 1 Encounters:  07/03/17 '5\' 9"'$  (1.753 m)    Weight:   Wt Readings from Last 1 Encounters:  07/07/17 230 lb 14.4 oz (104.7 kg)    Ideal Body Weight:  72.7 kg  BMI:  Body mass index is 34.1 kg/m.  Estimated Nutritional Needs:   Kcal:  2100-2400kcal/day   Protein:  107-128g/day   Fluid:  >2L/day   EDUCATION NEEDS:   Education needs addressed  CasKoleen Distance, RD, LDN Pager #- [phone] Hours Pager: [phone]

## 2017-07-07 NOTE — Progress Notes (Signed)
Patient c/onausea. Notified MD Rosana Hoes. Instructed to re-insert NG if patient begins to vomit. Patient begin vomiting. Administered IV zofran. Inserted NG tube. Tolerated well. Confirmed placement with abdominal xray. Connected to suction.

## 2017-07-07 NOTE — Progress Notes (Signed)
Peripherally Inserted Central Catheter/Midline Placement  The IV Nurse has discussed with the patient and/or persons authorized to consent for the patient, the purpose of this procedure and the potential benefits and risks involved with this procedure.  The benefits include less needle sticks, lab draws from the catheter, and the patient may be discharged home with the catheter. Risks include, but not limited to, infection, bleeding, blood clot (thrombus formation), and puncture of an artery; nerve damage and irregular heartbeat and possibility to perform a PICC exchange if needed/ordered by physician.  Alternatives to this procedure were also discussed.  B[employer] Power PICC patient education guide, fact sheet on infection prevention and patient information card has been provided to patient /or left at bedside.    PICC/Midline Placement Documentation  PICC Double Lumen 32/35/57 PICC Right Basilic 43 cm 0 cm (Active)  Indication for Insertion or Continuance of Line Administration of hyperosmolar/irritating solutions (i.e. TPN, Vancomycin, etc.) 07/07/2017  6:00 PM  Exposed Catheter (cm) 0 cm 07/07/2017  6:00 PM  Site Assessment Clean;Dry;Intact 07/07/2017  6:00 PM  Lumen #1 Status Flushed;Blood return noted 07/07/2017  6:00 PM  Lumen #2 Status Flushed;Blood return noted 07/07/2017  6:00 PM  Dressing Type Transparent 07/07/2017  6:00 PM  Dressing Status Clean;Dry;Intact;Antimicrobial disc in place 07/07/2017  6:00 PM  Dressing Change Due 07/14/17 07/07/2017  6:00 PM       Jule Economy Horton 07/07/2017, 6:46 PM

## 2017-07-07 NOTE — Progress Notes (Signed)
[place] Hospital Day(s): 5.   Post op day(s): 4 Days Post-Op.   Interval History: Patient seen and examined, NG tube dislodged overnight and not replaced until patient developed nausea and vomited today. Patient reports abdominal "rumbling" as if anticipates BM approaching, denies flatus or BM, N/V since NG tube replaced, fever/chills, CP, or SOB, and patient reportedly ambulated around nursing station this morning and plans to again.  Review of Systems:  Constitutional: denies fever, chills  HEENT: denies cough or congestion  Respiratory: denies any shortness of breath  Cardiovascular: denies chest pain or palpitations  Gastrointestinal: abdominal pain, N/V, and bowel function as per interval history Genitourinary: denies burning with urination or urinary frequency Musculoskeletal: denies pain, decreased motor or sensation Integumentary: denies any other rashes or skin discolorations except post-surgical abdominal wounds Neurological: denies HA or vision/hearing changes   Vital signs in last 24 hours: [min-max] current  Temp:  [97.9 F (36.6 C)-98.5 F (36.9 C)] 98 F (36.7 C) (10/09 0445) Pulse Rate:  [74-106] 106 (10/09 0738) Resp:  [12-19] 15 (10/09 0757) BP: (122-140)/(78-84) 134/84 (10/09 0738) SpO2:  [96 %-100 %] 96 % (10/09 0757) FiO2 (%):  [98 %] 98 % (10/08 0847)     Height: 5\' 9"  (175.3 cm) Weight: 236 lb (107 kg) BMI (Calculated): 34.84   Intake/Output this shift:  Total I/O In: 137 [I.V.:137] Out: 112.5 [Urine:100; Drains:12.5]   Intake/Output last 2 shifts:  @IOLAST2SHIFTS @   Physical Exam:  Constitutional: alert, cooperative and no distress  HENT: normocephalic without obvious abnormality  Eyes: PERRL, EOM's grossly intact and symmetric  Neuro: CN II - XII grossly intact and symmetric without deficit  Respiratory: breathing non-labored at rest  Cardiovascular: regular rate and sinus rhythm  Gastrointestinal: soft, obese, and only minimal  peri-incisional tenderness to palpation, serosanguinous drainage from base of vertical midline laparotomy wound dressed with dry gauze and ABD, no surrounding erythema, bilious drainage from NG tube, no ostomy function, ostomy somewhat dusky but unchanged per report Musculoskeletal: UE and LE FROM, motor and sensation grossly intact, NT   Labs:  CBC Latest Ref Rng & Units 07/04/2017 07/03/2017 07/02/2017  WBC 3.8 - 10.6 K/uL 9.2 11.5(H) 13.5(H)  Hemoglobin 13.0 - 18.0 g/dL 11.9(L) 13.3 13.1  Hematocrit 40.0 - 52.0 % 33.9(L) 37.5(L) 38.1(L)  Platelets 150 - 440 K/uL 171 179 162   CMP Latest Ref Rng & Units 07/05/2017 07/04/2017 07/02/2017  Glucose 65 - 99 mg/dL 130(H) 163(H) 143(H)  BUN 6 - 20 mg/dL 24(H) 27(H) 28(H)  Creatinine 0.61 - 1.24 mg/dL 0.80 0.82 0.88  Sodium 135 - 145 mmol/L 138 137 132(L)  Potassium 3.5 - 5.1 mmol/L 4.4 4.5 4.1  Chloride 101 - 111 mmol/L 99(L) 102 99(L)  CO2 22 - 32 mmol/L 33(H) 30 26  Calcium 8.9 - 10.3 mg/dL 8.0(L) 7.9(L) 8.3(L)  Total Protein 6.5 - 8.1 g/dL - - -  Total Bilirubin 0.3 - 1.2 mg/dL - - -  Alkaline Phos 38 - 126 U/L - - -  AST 15 - 41 U/L - - -  ALT 17 - 63 U/L - - -   Imaging studies:  Renal Ultrasound (07/06/2017) 1.9 x 1.4 x 1.7 cm exophytic hypoechoic structure not clearly reflecting a simple cyst. Use located in the right upper pole cortex. Further evaluation with hepatic protocol MRI is recommended when the patient can tolerate the procedure.  Normal appearance of the left kidney and visualized portions of the urinary bladder.   Assessment/Plan: (ICD-10's: K57.20) 71  y.o. male with post-operative ileus 4 Days Post-Op s/p Hartman's partial colectomy with creation of end colostomy for perforated sigmoid colonic diverticulitis with pneumoperitoneum, complicated by pertinent comorbidities including chronic Xarelto anticoagulation for atrial fibrillation without prior embolic event, CAD s/p CABG (1999) for ACS without MI, HTN,  hypercholesterolemia, and squamous cell carcinoma of abdominal wall.              - NPO, IVF, IV antibiotics             - pain control prn, minimize narcotics  - PICC + TPN indicated for prolonged NPO             - continue nasogastric decompression for now             - monitor abdominal exam and ostomy function and viability              - medical management of medical comorbidities             - DVT prophylaxis, ambulation encouraged  All of the above findings and recommendations were discussed with the patient, patient's family, and patient's RN, and all of patient's and family's questions were answered to their expressed satisfaction.  -- Marilynne Drivers Rosana Hoes, MD, [place]: [facility] - Partnering for exceptional care. Office: [phone]

## 2017-07-07 NOTE — Progress Notes (Signed)
Patient got up to Bedside, NGT pulled out approximately 10cm; suctioning audible in throat; patient coughing and gagging; NGT removed all the way; patient denies nausea at this time; Dr. Adonis Huguenin notified;  "We will leave it out for now and see how he does."  Will continue to monitor; Barbaraann Faster, RN 3:53 AM 07/07/2017

## 2017-07-07 NOTE — Progress Notes (Signed)
[facility] [place] NAME: Cherry Wittwer    MR#:  [id number]  DATE OF BIRTH:  1946-05-09  SUBJECTIVE:   Still has abdominal pain. Feels his stomach is more distended.  NG tube his placed on patient moved overnight. Had to be removed. Not reinserted.  Hartman's procedure on 07/03/2017.  Bowel sounds present today  REVIEW OF SYSTEMS:   Review of Systems  Constitutional: Negative for chills, fever and weight loss.  HENT: Negative for ear discharge, ear pain and nosebleeds.   Eyes: Negative for blurred vision, pain and discharge.  Respiratory: Negative for sputum production, shortness of breath, wheezing and stridor.   Cardiovascular: Negative for chest pain, palpitations, orthopnea and PND.  Gastrointestinal: Positive for abdominal pain, nausea and vomiting. Negative for diarrhea.  Genitourinary: Negative for frequency and urgency.  Musculoskeletal: Negative for back pain and joint pain.  Neurological: Positive for weakness. Negative for sensory change, speech change and focal weakness.  Psychiatric/Behavioral: Negative for depression and hallucinations. The patient is not nervous/anxious.     DRUG ALLERGIES:   Allergies  Allergen Reactions  . Shellfish Allergy     VITALS:  Blood pressure 134/84, pulse (!) 106, temperature 98 F (36.7 C), temperature source Oral, resp. rate 15, height 5\' 9"  (1.753 m), weight 107 kg (236 lb), SpO2 96 %.  PHYSICAL EXAMINATION:   Physical Exam  GENERAL:  71 y.o.-year-old patient lying in the bed with no acute distress. obese EYES: Pupils equal, round, reactive to Alas and accommodation. No scleral icterus. Extraocular muscles intact.  HEENT: Head atraumatic, normocephalic. Oropharynx and nasopharynx clear.  NECK:  Supple, no jugular venous distention. No thyroid enlargement, no tenderness.  LUNGS: Normal breath sounds bilaterally, no wheezing, rales, rhonchi. No use of accessory muscles of  respiration.  CARDIOVASCULAR: S1, S2 normal. No murmurs, rubs, or gallops.  ABDOMEN: distended. Tender diffusely. Bowel sounds present. Dressing over her surgical wound. EXTREMITIES: No cyanosis, clubbing or edema b/l.    NEUROLOGIC: Cranial nerves II through XII are intact. No focal Motor or sensory deficits b/l.   PSYCHIATRIC:  patient is alert and oriented x 3.  SKIN: No obvious rash, lesion, or ulcer.   LABORATORY PANEL:  CBC  Recent Labs Lab 07/04/17 0435  WBC 9.2  HGB 11.9*  HCT 33.9*  PLT 171    Chemistries   Recent Labs Lab 07/01/17 1607  07/05/17 0353  NA 132*  < > 138  K 4.5  < > 4.4  CL 100*  < > 99*  CO2 24  < > 33*  GLUCOSE 138*  < > 130*  BUN 27*  < > 24*  CREATININE 0.90  < > 0.80  CALCIUM 9.3  < > 8.0*  AST 25  --   --   ALT 21  --   --   ALKPHOS 45  --   --   BILITOT 3.1*  --   --   < > = values in this interval not displayed. Cardiac Enzymes No results for input(s): TROPONINI in the last 168 hours. RADIOLOGY:  US Renal  Result Date: 07/06/2017 CLINICAL DATA:  Right renal mass EXAM: RENAL / URINARY TRACT ULTRASOUND COMPLETE COMPARISON:  Noncontrast abdominal CT scan of July 01, 2017 FINDINGS: Right Kidney: Length: 13.7 cm. The renal cortical echotexture remains lower than that of the liver. There is a hypoechoic structure in the upper pole measuring 1.9 x 1.4 x 1.7. It is partially exophytic. There is no hydronephrosis.  Left Kidney: Length: 14.2 cm. Echogenicity within normal limits. No mass or hydronephrosis visualized. Bladder: Limited visualization due to bandage material from abdominal surgery. Appears normal for degree of bladder distention. Incidental note is made of sludge within the gallbladder. IMPRESSION: 1.9 x 1.4 x 1.7 cm exophytic hypoechoic structure not clearly reflecting a simple cyst. Use located in the right upper pole cortex. Further evaluation with hepatic protocol MRI is recommended when the patient can tolerate the procedure.  Normal appearance of the left kidney and visualized portions of the urinary bladder. Incidental note is made of sludge within the gallbladder. Electronically Signed   By: David  [name] M.D.   On: 07/06/2017 10:22   ASSESSMENT AND PLAN:  71 y.o. male presented to [facility] ED tonight for abdominal pain. Patient reports his lower abdominal pain began 1.5 days ago (10/2) after he experienced acute on chronic intermittent constipation 3 days ago (10/1) for which patient took milk of magnesia twice after the first dose did not seem to help. He was found to have acute sigmoid diverticulitis with perforation  *Acute Sigmoid Diverticulitis with perforation S/p Exploratory laparotomy and sigmoid colon resection colostomy (Hartman's procedure) NG tube in removed Xarelto held  * Hypertension - well controlled Discontinued Avapro and coreg. On IV lopressor. Restart home  Medications when able to take pills  * A fib  Resume xarelto per surgery when able  * Hx of CAD stable    Spoke with patient and wife at bedside  Case discussed with Care Management/Social Worker. Management plans discussed with the patient, family and they are in agreement.  CODE STATUS: FULL  DVT Prophylaxis: SCDs  TOTAL TIME TAKING CARE OF THIS PATIENT: 30 minutes.   Note: This dictation was prepared with Dragon dictation along with smaller phrase technology. Any transcriptional errors that result from this process are unintentional.  Hillary Bow R M.D on 07/07/2017 at 8:50 AM  Between 7am to 6pm - Pager - [phone]  After 6pm go to [email or web address] - password [email or web address] [facility]  Office  [phone]  CC: Primary care physician; Lillard Anes, MDPatient ID: [name], male   DOB: 18-Dec-1945, 71 y.o.   MRN: [id number]

## 2017-07-08 LAB — DIFFERENTIAL
BASOS PCT: 0 %
Basophils Absolute: 0 10*3/uL (ref 0–0.1)
EOS ABS: 0.2 10*3/uL (ref 0–0.7)
EOS PCT: 3 %
LYMPHS PCT: 13 %
Lymphs Abs: 0.8 10*3/uL — ABNORMAL LOW (ref 1.0–3.6)
MONO ABS: 0.6 10*3/uL (ref 0.2–1.0)
Monocytes Relative: 10 %
NEUTROS PCT: 74 %
Neutro Abs: 4.7 10*3/uL (ref 1.4–6.5)

## 2017-07-08 LAB — TRIGLYCERIDES: Triglycerides: 110 mg/dL (ref <150)

## 2017-07-08 LAB — CBC
HCT: 35 % — ABNORMAL LOW (ref 40.0–52.0)
Hemoglobin: 12.2 g/dL — ABNORMAL LOW (ref 13.0–18.0)
MCH: 30.3 pg (ref 26.0–34.0)
MCHC: 34.8 g/dL (ref 32.0–36.0)
MCV: 87.1 fL (ref 80.0–100.0)
PLATELETS: 236 10*3/uL (ref 150–440)
RBC: 4.02 MIL/uL — AB (ref 4.40–5.90)
RDW: 14.1 % (ref 11.5–14.5)
WBC: 6.3 10*3/uL (ref 3.8–10.6)

## 2017-07-08 LAB — COMPREHENSIVE METABOLIC PANEL
ALK PHOS: 55 U/L (ref 38–126)
ALT: 29 U/L (ref 17–63)
ANION GAP: 7 (ref 5–15)
AST: 29 U/L (ref 15–41)
Albumin: 2.4 g/dL — ABNORMAL LOW (ref 3.5–5.0)
BUN: 18 mg/dL (ref 6–20)
CALCIUM: 8.3 mg/dL — AB (ref 8.9–10.3)
CO2: 33 mmol/L — AB (ref 22–32)
Chloride: 99 mmol/L — ABNORMAL LOW (ref 101–111)
Creatinine, Ser: 0.7 mg/dL (ref 0.61–1.24)
Glucose, Bld: 125 mg/dL — ABNORMAL HIGH (ref 65–99)
Potassium: 3.9 mmol/L (ref 3.5–5.1)
SODIUM: 139 mmol/L (ref 135–145)
Total Bilirubin: 1.8 mg/dL — ABNORMAL HIGH (ref 0.3–1.2)
Total Protein: 6.1 g/dL — ABNORMAL LOW (ref 6.5–8.1)

## 2017-07-08 LAB — PREALBUMIN: PREALBUMIN: 13.4 mg/dL — AB (ref 18–38)

## 2017-07-08 LAB — PHOSPHORUS: PHOSPHORUS: 3.4 mg/dL (ref 2.5–4.6)

## 2017-07-08 LAB — MAGNESIUM: MAGNESIUM: 2 mg/dL (ref 1.7–2.4)

## 2017-07-08 MED ORDER — ENOXAPARIN SODIUM 40 MG/0.4ML ~~LOC~~ SOLN
40.0000 mg | SUBCUTANEOUS | Status: DC
  Administered 2017-07-08 – 2017-07-10 (×3): 40 mg via SUBCUTANEOUS
  Filled 2017-07-08 (×3): qty 0.4

## 2017-07-08 MED ORDER — TRACE MINERALS CR-CU-MN-SE-ZN 10-1000-500-60 MCG/ML IV SOLN
INTRAVENOUS | Status: AC
  Administered 2017-07-08: 19:00:00 via INTRAVENOUS
  Filled 2017-07-08: qty 984

## 2017-07-08 MED ORDER — FAT EMULSION 20 % IV EMUL
250.0000 mL | INTRAVENOUS | Status: AC
  Administered 2017-07-08: 250 mL via INTRAVENOUS
  Filled 2017-07-08: qty 250

## 2017-07-08 MED ORDER — LACTATED RINGERS IV SOLN
INTRAVENOUS | Status: DC
  Administered 2017-07-08 – 2017-07-09 (×2): via INTRAVENOUS

## 2017-07-08 NOTE — Consult Note (Addendum)
[place] Nurse ostomy consult note Stoma type/location: LLQ colostomy Stomal assessment/size: 1 5/8" slightly oval. Edematous and black and dark red and dusky, moist Peristomal assessment: intact Treatment options for stomal/peristomal skin: barrier ring Output Blood tinged liquid at this time. New NG tube in place Ostomy pouching:2 piece, 2 3/4" pouch. I Cut off center to accommodate midline incision. Education provided: Wife able to unlock and roll and lock pouch, practicing on clean pouch. Able to explain why we are cutting the wafer to the side.  Changed pouch, used  barrier ring, discussed dip in skin and potential for leaks.  Enrolled patient in [place] program: No, not sure of pouch system yet. WOC will see patient Friday for further teaching and pouch change.  Fara Olden, RN-C, WTA-C, [place] [facility] Treatment Associate Ostomy Care Associate

## 2017-07-08 NOTE — Progress Notes (Signed)
[place] NOTE   Pharmacy Consult for TPN Indication: prolonged ileus/NPO  Patient Measurements: Height: 5\' 9"  (175.3 cm) Weight: 230 lb 14.4 oz (104.7 kg) (bed weight) IBW/kg (Calculated) : 70.7 TPN AdjBW (KG): 79.8 Body mass index is 34.1 kg/m. Usual Weight:   Assessment: 66 yom now POD 5 (hospital day 6) Hartman's partial colectomy for perforated sigmoid colonic diverticulitis with pneumoperitoneum complicated by prolonged NPO. Patient was on Xarelto for AF with no prior embolic event.   GI: Currently NPO, to start TPN this evening Endo: Not requiring insulin at this time Insulin requirements in the past 24 hours:  [place]: K 3.9, Mg 2, Phos 3.4, Ca 8.3, albumin 2.4 - no indication for repletion at this time Renal: SCr 0.7 mg/dL - stable Pulm:  Cards:  Hepatobil: LFTs WNL, T bili 1.8 - will follow Neuro: ID: WBC 6.3, Tmax 24 98, BP 123/73 to 146/81, HR 70 to 90, RR 16 - Zosyn day 7 for IAI  Best Practices: TPN Access: DL PICC placed 07/07/2017 TPN start date: 07/08/2017  Nutritional Goals (per RD recommendation on 07/07/2017): KCal: 1345 kcal/day Protein: 49 gm/day  Current Nutrition:   Plan:  Start Clinimix E5/20 at 41 ml/hr Start 20% lipid emulsion at 20 ml/hr x 12 hours per day Add MVI, trace, and thiamine 100 mg per day Change IVF from D5LR at 125 mL/hr to LR at 75 mL/hr  This provides 49 g of protein and 1345 kCals per day meeting 40% of protein and 50% of kCal needs Add MVI, trace elements, and 100 mg thiamine in TPN Monitor TPN labs per protocol F/U 07/09/2017  Laural Benes, Pharm.D., BCPS Clinical Pharmacist 07/08/2017,9:46 AM

## 2017-07-08 NOTE — Progress Notes (Signed)
[place] Hospital Day(s): 6.   Post op day(s): 5 Days Post-Op.   Interval History: Patient seen and examined, no acute events or new complaints overnight since PCA stopped and PICC placed yesterday, awaiting TPN. Patient reports small amount of gas in ostomy bag this morning, but still significant drainage from NG tube. Patient otherwise denies N/V, fever/chills, CP, or SOB and has been ambulating in the halls including once so far this morning.  Review of Systems:  Constitutional: denies fever, chills  HEENT: denies cough or congestion  Respiratory: denies any shortness of breath  Cardiovascular: denies chest pain or palpitations  Gastrointestinal: abdominal pain, N/V, and bowel function as per interval history Genitourinary: denies burning with urination or urinary frequency Musculoskeletal: denies pain, decreased motor or sensation Integumentary: denies any other rashes or skin discolorations except post-surgical abdominal wounds Neurological: denies HA or vision/hearing changes   Vital signs in last 24 hours: [min-max] current  Temp:  [97.7 F (36.5 C)-99.1 F (37.3 C)] 97.7 F (36.5 C) (10/10 0421) Pulse Rate:  [70-90] 70 (10/10 0421) Resp:  [15-18] 16 (10/10 0421) BP: (127-146)/(77-83) 146/81 (10/10 0421) SpO2:  [95 %-100 %] 100 % (10/10 0421) Weight:  [230 lb 14.4 oz (104.7 kg)] 230 lb 14.4 oz (104.7 kg) (10/09 1324)     Height: 5\' 9"  (175.3 cm) Weight: 230 lb 14.4 oz (104.7 kg) (bed weight) BMI (Calculated): 34.08   Intake/Output this shift:  No intake/output data recorded.   Intake/Output last 2 shifts:  @IOLAST2SHIFTS @   Physical Exam:  Constitutional: alert, cooperative and no distress  HENT: normocephalic without obvious abnormality  Eyes: PERRL, EOM's grossly intact and symmetric  Neuro: CN II - XII grossly intact and symmetric without deficit  Respiratory: breathing non-labored at rest  Cardiovascular: regular rate and sinus rhythm   Gastrointestinal: soft, obese, and only minimal peri-incisional tenderness to palpation, serosanguinous drainage from base of vertical midline laparotomy wound dressed with dry gauze and ABD, no surrounding erythema, bilious drainage from NG tube, no ostomy function, ostomy somewhat dusky but unchanged per report Musculoskeletal: UE and LE FROM, no edema or wounds, motor and sensation grossly intact, NT   Labs:  CBC Latest Ref Rng & Units 07/08/2017 07/04/2017 07/03/2017  WBC 3.8 - 10.6 K/uL 6.3 9.2 11.5(H)  Hemoglobin 13.0 - 18.0 g/dL 12.2(L) 11.9(L) 13.3  Hematocrit 40.0 - 52.0 % 35.0(L) 33.9(L) 37.5(L)  Platelets 150 - 440 K/uL 236 171 179   CMP Latest Ref Rng & Units 07/08/2017 07/05/2017 07/04/2017  Glucose 65 - 99 mg/dL 125(H) 130(H) 163(H)  BUN 6 - 20 mg/dL 18 24(H) 27(H)  Creatinine 0.61 - 1.24 mg/dL 0.70 0.80 0.82  Sodium 135 - 145 mmol/L 139 138 137  Potassium 3.5 - 5.1 mmol/L 3.9 4.4 4.5  Chloride 101 - 111 mmol/L 99(L) 99(L) 102  CO2 22 - 32 mmol/L 33(H) 33(H) 30  Calcium 8.9 - 10.3 mg/dL 8.3(L) 8.0(L) 7.9(L)  Total Protein 6.5 - 8.1 g/dL 6.1(L) - -  Total Bilirubin 0.3 - 1.2 mg/dL 1.8(H) - -  Alkaline Phos 38 - 126 U/L 55 - -  AST 15 - 41 U/L 29 - -  ALT 17 - 63 U/L 29 - -   Imaging studies:  Chest X-ray (07/07/2017) RIGHT arm PICC line tip projects over SVC. Nasogastric tube extends into stomach.  No acute infiltrate, pleural effusion or pneumothorax. Enlargement of cardiac silhouette post CABG. Minimal bibasilar atelectasis.   Assessment/Plan:(ICD-10's: K57.20) 71 y.o.malewith slowly resolving post-operative ileus 5 Days  Post-Ops/p Hartman's partial colectomy with creation of end colostomy for perforated sigmoid colonic diverticulitis with pneumoperitoneum, complicated by prolonged NPO and by pertinent comorbidities including chronic Xarelto anticoagulation for atrial fibrillation without prior embolic event, CAD s/p CABG (1999) for ACS without MI, HTN,  hypercholesterolemia, and squamous cell carcinoma of abdominal wall.  - NPO, IVF, IV antibiotics - pain control prn, minimize narcotics             - PICC + TPN indicated for prolonged NPO - continue nasogastric decompression for now  - will reassess NG and ostomy output, may d/c NG tube this afternoon or tomorrow  - even if NG tube removed today or tomorrow, TPN still indicated, will continue while slowly advancing diet - monitor abdominal exam and ostomy function and viability - medical management of medical comorbidities - DVT prophylaxis, ambulation encouraged  All of the above findings and recommendations were discussed with the patient,patient's family, and patient's RN, and all of patient's and family's questions were answered to their expressed satisfaction.  -- Marilynne Drivers Rosana Hoes, MD, [place]: [facility] - Partnering for exceptional care. Office: [phone]

## 2017-07-08 NOTE — Progress Notes (Signed)
[facility] [place] NAME: Ryan Mccall    MR#:  [id number]  DATE OF BIRTH:  10/27/1945  SUBJECTIVE:  CHIEF COMPLAINT:   Chief Complaint  Patient presents with  . Abdominal Pain   - Abdomen remains distended, NG tube had to be reinserted last night due to worsening nausea and vomiting and has significant amount drained since  Then. - feels better, passing flatus  REVIEW OF SYSTEMS:  Review of Systems  Constitutional: Negative for chills, fever and malaise/fatigue.  HENT: Negative for congestion, ear discharge, hearing loss and nosebleeds.   Eyes: Negative for blurred vision and double vision.  Respiratory: Negative for cough, shortness of breath and wheezing.   Cardiovascular: Negative for chest pain, palpitations and leg swelling.  Gastrointestinal: Positive for abdominal pain and constipation. Negative for diarrhea, nausea and vomiting.  Genitourinary: Negative for dysuria.  Musculoskeletal: Negative for myalgias.  Neurological: Negative for dizziness, seizures and headaches.    DRUG ALLERGIES:   Allergies  Allergen Reactions  . Shellfish Allergy     VITALS:  Blood pressure 123/73, pulse 86, temperature 97.9 F (36.6 C), temperature source Oral, resp. rate 16, height 5\' 9"  (1.753 m), weight 104.7 kg (230 lb 14.4 oz), SpO2 98 %.  PHYSICAL EXAMINATION:  Physical Exam  GENERAL:  71 y.o.-year-old patient lying in the bed with no acute distress.  EYES: Pupils equal, round, reactive to Leoni and accommodation. No scleral icterus. Extraocular muscles intact.  HEENT: Head atraumatic, normocephalic. Oropharynx and nasopharynx clear.  NECK:  Supple, no jugular venous distention. No thyroid enlargement, no tenderness.  LUNGS: Normal breath sounds bilaterally, no wheezing, rales,rhonchi or crepitation. No use of accessory muscles of respiration.  CARDIOVASCULAR: S1, S2 normal. No murmurs, rubs, or gallops.  ABDOMEN: Soft, nontender,  distended. Hypoactive Bowel sounds present. No organomegaly or mass.  EXTREMITIES: No pedal edema, cyanosis, or clubbing.  NEUROLOGIC: Cranial nerves II through XII are intact. Muscle strength 5/5 in all extremities. Sensation intact. Gait not checked.  PSYCHIATRIC: The patient is alert and oriented x 3.  SKIN: No obvious rash, lesion, or ulcer.    LABORATORY PANEL:   CBC  Recent Labs Lab 07/08/17 0534  WBC 6.3  HGB 12.2*  HCT 35.0*  PLT 236   ------------------------------------------------------------------------------------------------------------------  Chemistries   Recent Labs Lab 07/08/17 0534  NA 139  K 3.9  CL 99*  CO2 33*  GLUCOSE 125*  BUN 18  CREATININE 0.70  CALCIUM 8.3*  MG 2.0  AST 29  ALT 29  ALKPHOS 55  BILITOT 1.8*   ------------------------------------------------------------------------------------------------------------------  Cardiac Enzymes No results for input(s): TROPONINI in the last 168 hours. ------------------------------------------------------------------------------------------------------------------  RADIOLOGY:  Dg Abd 1 View  Result Date: 07/07/2017 CLINICAL DATA:  The patient has undergone nasogastric tube placement. EXAM: ABDOMEN - 1 VIEW COMPARISON:  Abdominal radiographs of October 5th 2018. FINDINGS: The tip of the esophagogastric tube projects in the region of the gastric cardia. The proximal port lies at the level of the GE junction. There remain loops of moderately distended fluid and air-filled small bowel in the upper and mid abdomen. IMPRESSION: The position of the esophagogastric tube is as described. Advancement by approximately 5-10 cm would assure that the proximal port is positioned below the GE junction. Electronically Signed   By: David  [name] M.D.   On: 07/07/2017 12:36   Dg Chest Port 1 View  Result Date: 07/07/2017 CLINICAL DATA:  PICC line placement, hypertension, atrial fibrillation, coronary artery  disease EXAM: PORTABLE CHEST 1 VIEW COMPARISON:  Portable exam 1853 hours without priors for comparison FINDINGS: RIGHT arm PICC line tip projects over SVC. Nasogastric tube extends into stomach. Enlargement of cardiac silhouette post CABG. Atherosclerotic calcification aorta. Mediastinal contours and pulmonary vascularity normal. Minimal bibasilar atelectasis. No acute infiltrate, pleural effusion or pneumothorax. IMPRESSION: Minimal bibasilar atelectasis. Mild enlargement of cardiac silhouette post CABG. Electronically Signed   By: Lavonia Dana M.D.   On: 07/07/2017 19:18    EKG:   Orders placed or performed during the hospital encounter of 07/01/17  . ED EKG  . ED EKG    ASSESSMENT AND PLAN:   71 [facility]. Patient reports his lower abdominal pain began 1.5 days ago (10/2) after he experienced acute on chronic intermittent constipation 3 days ago (10/1) for which patient took milk of magnesia twice after the first dose did not seem to help. He was found to have acute sigmoid diverticulitis with perforation.  *Acute Sigmoid Diverticulitis with perforation S/p Exploratory laparotomy and sigmoid colon resection colostomy (Hartman's procedure) on 07/03/17 Passing flatus. Further management per surgical team. -Still has JP drains in - remains NPO, starting on TPN - empirically on zosyn -Xarelto held  * Hypertension - well controlled Hold Avapro and coreg. On IV lopressor while NPO. Restart home  Medications when able to take pills  * A fib Resume xarelto per surgery when able Monitor HR  * Hx of CAD s/p CABG Stable  * DVT Prophylaxis- start lovenox   Spoke with patient and wife at bedside     All the records are reviewed and case discussed with Care Management/Social Workerr. Management plans discussed with the patient, family and they are in agreement.  CODE STATUS: Full Code  TOTAL TIME TAKING CARE OF THIS PATIENT: 37  minutes.   POSSIBLE D/C IN 2 DAYS, DEPENDING ON CLINICAL CONDITION.   Latrece Nitta M.D on 07/08/2017 at 12:53 PM  Between 7am to 6pm - Pager - [phone]  After 6pm go to [email or web address] - password [email or web address] [facility]  Office  [phone]  CC: Primary care physician; Lillard Anes, MD

## 2017-07-09 LAB — COMPREHENSIVE METABOLIC PANEL
ALBUMIN: 2.6 g/dL — AB (ref 3.5–5.0)
ALT: 29 U/L (ref 17–63)
ANION GAP: 8 (ref 5–15)
AST: 30 U/L (ref 15–41)
Alkaline Phosphatase: 60 U/L (ref 38–126)
BUN: 14 mg/dL (ref 6–20)
CALCIUM: 8.6 mg/dL — AB (ref 8.9–10.3)
CO2: 29 mmol/L (ref 22–32)
Chloride: 100 mmol/L — ABNORMAL LOW (ref 101–111)
Creatinine, Ser: 0.79 mg/dL (ref 0.61–1.24)
GFR calc non Af Amer: >60 mL/min (ref >60)
GLUCOSE: 129 mg/dL — AB (ref 65–99)
POTASSIUM: 3.8 mmol/L (ref 3.5–5.1)
SODIUM: 137 mmol/L (ref 135–145)
TOTAL PROTEIN: 6.6 g/dL (ref 6.5–8.1)
Total Bilirubin: 1.6 mg/dL — ABNORMAL HIGH (ref 0.3–1.2)

## 2017-07-09 LAB — MAGNESIUM: MAGNESIUM: 2.1 mg/dL (ref 1.7–2.4)

## 2017-07-09 LAB — PHOSPHORUS: Phosphorus: 2.9 mg/dL (ref 2.5–4.6)

## 2017-07-09 MED ORDER — LACTATED RINGERS IV SOLN
INTRAVENOUS | Status: AC
  Administered 2017-07-09: 10:00:00 via INTRAVENOUS

## 2017-07-09 MED ORDER — TRACE MINERALS CR-CU-MN-SE-ZN 10-1000-500-60 MCG/ML IV SOLN
INTRAVENOUS | Status: AC
  Administered 2017-07-09: 18:00:00 via INTRAVENOUS
  Filled 2017-07-09: qty 1992

## 2017-07-09 MED ORDER — FAT EMULSION 20 % IV EMUL
250.0000 mL | INTRAVENOUS | Status: AC
  Administered 2017-07-09: 250 mL via INTRAVENOUS
  Filled 2017-07-09: qty 250

## 2017-07-09 NOTE — Progress Notes (Signed)
Visually inspected and digitalized the colostomy. It is patent and viable. The duskiness seen previously is almost completely resolved. Continue to await full bowel function before removing nasogastric tube

## 2017-07-09 NOTE — Progress Notes (Signed)
6 Days Post-Op  Subjective: Status post Hartman's procedure for perforated diverticulitis. He has a prolonged ileus as he had some interloop there were no purulent material at the time of surgery. Yesterday he had some nausea and a single emesis. NG tube was replaced.  Objective: Vital signs in last 24 hours: Temp:  [97.5 F (36.4 C)-98.6 F (37 C)] 98 F (36.7 C) (10/11 0431) Pulse Rate:  [68-86] 74 (10/11 0431) Resp:  [16-18] 18 (10/11 0431) BP: (123-142)/(73-83) 140/80 (10/11 0431) SpO2:  [93 %-99 %] 93 % (10/11 0431) Weight:  [225 lb 8 oz (102.3 kg)-226 lb 12.8 oz (102.9 kg)] 225 lb 8 oz (102.3 kg) (10/11 0500) Last BM Date: 07/09/17  Intake/Output from previous day: 10/10 0701 - 10/11 0700 In: 4107.8 [I.V.:3957.8; IV Piggyback:150] Out: 3832.5 [Urine:1750; Emesis/NG output:2000; Drains:82.5] Intake/Output this shift: Total I/O In: -  Out: 125 [Urine:125]  Physical exam:  Comfortable-appearing male patient abdomen is protuberant and nontender wound is clean ostomy shows some minimal output. Ostomy itself is dusky  Lab Results: CBC   Recent Labs  07/08/17 0534  WBC 6.3  HGB 12.2*  HCT 35.0*  PLT 236   BMET  Recent Labs  07/08/17 0534 07/09/17 0630  NA 139 137  K 3.9 3.8  CL 99* 100*  CO2 33* 29  GLUCOSE 125* 129*  BUN 18 14  CREATININE 0.70 0.79  CALCIUM 8.3* 8.6*   PT/INR No results for input(s): LABPROT, INR in the last 72 hours. ABG No results for input(s): PHART, HCO3 in the last 72 hours.  Invalid input(s): PCO2, PO2  Studies/Results: Dg Abd 1 View  Result Date: 07/07/2017 CLINICAL DATA:  The patient has undergone nasogastric tube placement. EXAM: ABDOMEN - 1 VIEW COMPARISON:  Abdominal radiographs of October 5th 2018. FINDINGS: The tip of the esophagogastric tube projects in the region of the gastric cardia. The proximal port lies at the level of the GE junction. There remain loops of moderately distended fluid and air-filled small bowel in  the upper and mid abdomen. IMPRESSION: The position of the esophagogastric tube is as described. Advancement by approximately 5-10 cm would assure that the proximal port is positioned below the GE junction. Electronically Signed   By: David  [name] M.D.   On: 07/07/2017 12:36   Dg Chest Port 1 View  Result Date: 07/07/2017 CLINICAL DATA:  PICC line placement, hypertension, atrial fibrillation, coronary artery disease EXAM: PORTABLE CHEST 1 VIEW COMPARISON:  Portable exam 1853 hours without priors for comparison FINDINGS: RIGHT arm PICC line tip projects over SVC. Nasogastric tube extends into stomach. Enlargement of cardiac silhouette post CABG. Atherosclerotic calcification aorta. Mediastinal contours and pulmonary vascularity normal. Minimal bibasilar atelectasis. No acute infiltrate, pleural effusion or pneumothorax. IMPRESSION: Minimal bibasilar atelectasis. Mild enlargement of cardiac silhouette post CABG. Electronically Signed   By: Lavonia Dana M.D.   On: 07/07/2017 19:18    Anti-infectives: Anti-infectives    Start     Dose/Rate Route Frequency Ordered Stop   07/02/17 0200  piperacillin-tazobactam (ZOSYN) IVPB 3.375 g     3.375 g 12.5 mL/hr over 240 Minutes Intravenous Every 8 hours 07/02/17 0150     07/01/17 2115  piperacillin-tazobactam (ZOSYN) IVPB 3.375 g     3.375 g 100 mL/hr over 30 Minutes Intravenous  Once 07/01/17 2103 07/01/17 2236      Assessment/Plan: s/p Procedure(s): COLECTOMY WITH COLOSTOMY CREATION/HARTMANN PROCEDURE   Normal labs. Ostomy appears to start to be functioning. Once he is showing good signs of passing  gas through the ostomy then NG tube can be removed but continue current care.  Florene Glen, MD, FACS  07/09/2017

## 2017-07-09 NOTE — Progress Notes (Signed)
[facility] at [place] NAME: Chibueze Beasley    MR#:  [id number]  DATE OF BIRTH:  09-07-1946  SUBJECTIVE:  CHIEF COMPLAINT:   Chief Complaint  Patient presents with  . Abdominal Pain   -Feels about the same. NG with Hermans greenish drainage at this time. -Ambulating well in the hallways. Passing flatus  REVIEW OF SYSTEMS:  Review of Systems  Constitutional: Negative for chills, fever and malaise/fatigue.  HENT: Negative for congestion, ear discharge, hearing loss and nosebleeds.   Eyes: Negative for blurred vision and double vision.  Respiratory: Negative for cough, shortness of breath and wheezing.   Cardiovascular: Negative for chest pain, palpitations and leg swelling.  Gastrointestinal: Positive for abdominal pain and constipation. Negative for diarrhea, nausea and vomiting.  Genitourinary: Negative for dysuria.  Musculoskeletal: Negative for myalgias.  Neurological: Negative for dizziness, seizures and headaches.    DRUG ALLERGIES:   Allergies  Allergen Reactions  . Shellfish Allergy     VITALS:  Blood pressure 140/80, pulse 74, temperature 98 F (36.7 C), temperature source Oral, resp. rate 18, height 5\' 9"  (1.753 m), weight 102.3 kg (225 lb 8 oz), SpO2 93 %.  PHYSICAL EXAMINATION:  Physical Exam  GENERAL:  71 y.o.-year-old patient lying in the bed with no acute distress.  EYES: Pupils equal, round, reactive to Joo and accommodation. No scleral icterus. Extraocular muscles intact.  HEENT: Head atraumatic, normocephalic. Oropharynx and nasopharynx clear.  NECK:  Supple, no jugular venous distention. No thyroid enlargement, no tenderness.  LUNGS: Normal breath sounds bilaterally, no wheezing, rales,rhonchi or crepitation. No use of accessory muscles of respiration.  CARDIOVASCULAR: S1, S2 normal. No murmurs, rubs, or gallops.  ABDOMEN: Soft, nontender, less distended. Hypoactive Bowel sounds present. No organomegaly or mass.   EXTREMITIES: No pedal edema, cyanosis, or clubbing.  NEUROLOGIC: Cranial nerves II through XII are intact. Muscle strength 5/5 in all extremities. Sensation intact. Gait not checked.  PSYCHIATRIC: The patient is alert and oriented x 3.  SKIN: No obvious rash, lesion, or ulcer.    LABORATORY PANEL:   CBC  Recent Labs Lab 07/08/17 0534  WBC 6.3  HGB 12.2*  HCT 35.0*  PLT 236   ------------------------------------------------------------------------------------------------------------------  Chemistries   Recent Labs Lab 07/09/17 0630  NA 137  K 3.8  CL 100*  CO2 29  GLUCOSE 129*  BUN 14  CREATININE 0.79  CALCIUM 8.6*  MG 2.1  AST 30  ALT 29  ALKPHOS 60  BILITOT 1.6*   ------------------------------------------------------------------------------------------------------------------  Cardiac Enzymes No results for input(s): TROPONINI in the last 168 hours. ------------------------------------------------------------------------------------------------------------------  RADIOLOGY:  Dg Abd 1 View  Result Date: 07/07/2017 CLINICAL DATA:  The patient has undergone nasogastric tube placement. EXAM: ABDOMEN - 1 VIEW COMPARISON:  Abdominal radiographs of October 5th 2018. FINDINGS: The tip of the esophagogastric tube projects in the region of the gastric cardia. The proximal port lies at the level of the GE junction. There remain loops of moderately distended fluid and air-filled small bowel in the upper and mid abdomen. IMPRESSION: The position of the esophagogastric tube is as described. Advancement by approximately 5-10 cm would assure that the proximal port is positioned below the GE junction. Electronically Signed   By: David  [name] M.D.   On: 07/07/2017 12:36   Dg Chest Port 1 View  Result Date: 07/07/2017 CLINICAL DATA:  PICC line placement, hypertension, atrial fibrillation, coronary artery disease EXAM: PORTABLE CHEST 1 VIEW COMPARISON:  Portable exam 1853 hours  without priors for comparison FINDINGS: RIGHT arm PICC line tip projects over SVC. Nasogastric tube extends into stomach. Enlargement of cardiac silhouette post CABG. Atherosclerotic calcification aorta. Mediastinal contours and pulmonary vascularity normal. Minimal bibasilar atelectasis. No acute infiltrate, pleural effusion or pneumothorax. IMPRESSION: Minimal bibasilar atelectasis. Mild enlargement of cardiac silhouette post CABG. Electronically Signed   By: Lavonia Dana M.D.   On: 07/07/2017 19:18    EKG:   Orders placed or performed during the hospital encounter of 07/01/17  . ED EKG  . ED EKG    ASSESSMENT AND PLAN:   71 y.o.malepresented to [facility] ED tonightfor abdominal pain. Patient reports his lower abdominal pain began 1.5 days ago (10/2) after he experienced acute on chronic intermittent constipation 3 days ago (10/1) for which patient took milk of magnesia twice after the first dose did not seem to help. He was found to have acute sigmoid diverticulitis with perforation.  *Acute Sigmoid Diverticulitis with perforation S/p Exploratory laparotomy and sigmoid colon resection colostomy (Hartman's procedure) on 07/03/17 Passing flatus. Further management per surgical team. -Still has JP drains in - remains NPO, started on TPN on 07/08/17 - on zosyn- stop after 10 days -Xarelto held  * Hypertension - well controlled Hold Avapro and coreg. On IV lopressor while NPO. Restart home  Medications when able to take pills  * A fib Resume xarelto per surgery when able to Monitor HR  * Hx of CAD s/p CABG Stable  * DVT Prophylaxis- on lovenox   Spoke with patient and wife at bedside     All the records are reviewed and case discussed with Care Management/Social Workerr. Management plans discussed with the patient, family and they are in agreement.  CODE STATUS: Full Code  TOTAL TIME TAKING CARE OF THIS PATIENT: 37 minutes.   POSSIBLE D/C IN 2 DAYS, DEPENDING ON  CLINICAL CONDITION.   Gladstone Lighter M.D on 07/09/2017 at 10:40 AM  Between 7am to 6pm - Pager - [phone]  After 6pm go to [email or web address] - password [email or web address] [facility]  Office  [phone]  CC: Primary care physician; Lillard Anes, MD

## 2017-07-09 NOTE — Progress Notes (Signed)
Nutrition Follow Up Note   DOCUMENTATION CODES:   Obesity unspecified  INTERVENTION:   Clinimix 5/20 with electrolytes- Advance to goal rate of 20ml/hr Continue 20% lipids @20ml /hr x 12 hrs/day   Regimen provides 2233kcal/day, 100g/day protein, 2275ml total volume  Recommend discontinue IVF  Continue MVI daily   Continue IV thiamine 100mg  daily   Continue daily weights  NUTRITION DIAGNOSIS:   Inadequate oral intake related to acute illness as evidenced by NPO status. ongoing  GOAL:   Patient will meet greater than or equal to 90% of their needs  MONITOR:   Diet advancement, Labs, Weight trends, I & O's  ASSESSMENT:   71 y.o. male with perforated sigmoid colonic diverticulitis with pneumoperitoneum now s/p Hartman's partial colectomy with creation of end colostomy 54/4, complicated by pertinent comorbidities including chronic Xarelto anticoagulation for atrial fibrillation without prior embolic event, CAD s/p CABG (1999) for ACS without MI, HTN, hypercholesterolemia, and squamous cell carcinoma of abdominal wall.   Pt tolerating TPN well; advance to goal today. Pt had bowel movement today that was black and dark red and dusky in appearance. NGT replaced 10/9 and with 2L output over the past 24 hrs. Plan to continue TPN until pt able to meet 75% of his estimated needs via oral intake. Per chart, pt with 11lb(5%) since admit.   Medications reviewed and include: lovenox, hydromorphone, LRS @75ml /hr, zosyn, zofran prn  Labs reviewed: K 3.8 wnl, Cl 100(L), CO2 33(H), Ca 8.6(L) adj. 9.72 wnl, P 2.9 wnl, Mg 2.1 wnl, alb 2.6(L), t bili 1.6(H) Prealbumin- 13.4(L)- 10/10 triglycerides 110- 10/10 cbgs- 125, 129 x 24 hrs  Diet Order:  Diet NPO time specified Except for: Ice Chips .TPN (CLINIMIX-E) Adult  Skin:  Wound (see comment) (incision abdomen )  Last BM:  10/11- ostomy- red color  Height:   Ht Readings from Last 1 Encounters:  07/03/17 5\' 9"  (1.753 m)    Weight:    Wt Readings from Last 1 Encounters:  07/09/17 225 lb 8 oz (102.3 kg)    Ideal Body Weight:  72.7 kg  BMI:  Body mass index is 33.3 kg/m.  Estimated Nutritional Needs:   Kcal:  2100-2400kcal/day   Protein:  107-128g/day   Fluid:  >2L/day   EDUCATION NEEDS:   Education needs addressed  Koleen Distance MS, RD, LDN Pager #[phone] After Hours Pager: [phone]

## 2017-07-09 NOTE — Progress Notes (Signed)
[facility] NOTE   Pharmacy Consult for TPN Indication: prolonged ileus/NPO  Patient Measurements: Height: 5\' 9"  (175.3 cm) Weight: 225 lb 8 oz (102.3 kg) IBW/kg (Calculated) : 70.7 TPN AdjBW (KG): 79.8 Body mass index is 33.3 kg/m. Usual Weight:   Assessment: 57 yom now POD 6 (hospital day 7) Hartman's partial colectomy for perforated sigmoid colonic diverticulitis with pneumoperitoneum complicated by prolonged NPO. Patient was on Xarelto for AF with no prior embolic event.   GI: Currently NPO, to start TPN this evening Endo: Not requiring insulin at this time Insulin requirements in the past 24 hours:  Lytes: K 3.8, Mg 2.1, Phos 2.9, Ca 8.6, albumin 2.6 - no indication for repletion at this time Renal: SCr 0.79 mg/dL - stable Pulm:  Cards:  Hepatobil: LFTs WNL, T bili 1.6 down slightly- will follow Neuro: ID: WBC 6.3 (07/08/17), Tmax 24 98, BP 132/83 to 140/80, HR 70 to 74, RR 18 - Zosyn day 8 for IAI  Best Practices: TPN Access: DL PICC placed 07/07/2017 TPN start date: 07/08/2017  Nutritional Goals (per RD recommendation on 07/07/2017): KCal: 2233 kcal/day Protein: 100 gm/day  Current Nutrition:   Plan:  Increase Clinimix E5/20 to 83 ml/hr (that's goal) Continue 20% lipid emulsion at 20 ml/hr x 12 hours per day Add MVI, trace, and thiamine 100 mg per day Discontinue IVF LR at 75 mL/hr per RD  This provides 100 g of protein and 2233 kCals per day meeting 90% of protein and 100% of kCal needs Add MVI, trace elements, and 100 mg thiamine in TPN Monitor TPN labs per protocol F/U 07/10/2017  Laural Benes, Pharm.D., BCPS Clinical Pharmacist 07/09/2017,9:51 AM

## 2017-07-10 LAB — BASIC METABOLIC PANEL
Anion gap: 7 (ref 5–15)
BUN: 14 mg/dL (ref 6–20)
CHLORIDE: 107 mmol/L (ref 101–111)
CO2: 26 mmol/L (ref 22–32)
Calcium: 8.2 mg/dL — ABNORMAL LOW (ref 8.9–10.3)
Creatinine, Ser: 0.75 mg/dL (ref 0.61–1.24)
GFR calc Af Amer: >60 mL/min (ref >60)
GFR calc non Af Amer: >60 mL/min (ref >60)
GLUCOSE: 113 mg/dL — AB (ref 65–99)
POTASSIUM: 3.8 mmol/L (ref 3.5–5.1)
Sodium: 140 mmol/L (ref 135–145)

## 2017-07-10 LAB — MAGNESIUM: Magnesium: 2 mg/dL (ref 1.7–2.4)

## 2017-07-10 LAB — PHOSPHORUS: Phosphorus: 3 mg/dL (ref 2.5–4.6)

## 2017-07-10 MED ORDER — TRACE MINERALS CR-CU-MN-SE-ZN 10-1000-500-60 MCG/ML IV SOLN
INTRAVENOUS | Status: AC
  Administered 2017-07-10: 18:00:00 via INTRAVENOUS
  Filled 2017-07-10 (×2): qty 1992

## 2017-07-10 MED ORDER — FAT EMULSION 20 % IV EMUL
250.0000 mL | INTRAVENOUS | Status: AC
  Administered 2017-07-10: 250 mL via INTRAVENOUS
  Filled 2017-07-10: qty 250

## 2017-07-10 NOTE — Care Management (Signed)
Patient post op COLECTOMY WITH COLOSTOMY CREATION/HARTMANN PROCEDURE.  Patient lives at home with wife.  PCP Henrene Pastor.  Patient will need home health nursing for ostomy care. Patient currently has NG clamped. JP drain, Penrose x2, and TPN running.  Plan to wean TPN prior to discharge.  At this time patient does not feel that home health PT is indicated.  Patient agreeable to home health RN.  Home health agency preference provided to patient and wife.  Preference are in order as follows.  [facility], [facility], and [facility].  [facility] and [facility] were unable to accept the case.  Heads up referral made to [place] with [facility].  RNCM following.

## 2017-07-10 NOTE — Progress Notes (Signed)
7 Days Post-Op  Subjective: Feeling better every day. No nausea vomiting today he has gas in his bag. Minimal stool in his bag.  Objective: Vital signs in last 24 hours: Temp:  [97.6 F (36.4 C)-98.2 F (36.8 C)] 98 F (36.7 C) (10/12 0455) Pulse Rate:  [80-95] 81 (10/12 0606) Resp:  [18-21] 21 (10/12 0455) BP: (123-148)/(73-95) 123/81 (10/12 0606) SpO2:  [97 %-99 %] 98 % (10/12 0455) Weight:  [221 lb 14.4 oz (100.7 kg)] 221 lb 14.4 oz (100.7 kg) (10/12 0503) Last BM Date: 07/08/17  Intake/Output from previous day: 10/11 0701 - 10/12 0700 In: 5067.7 [I.V.:4967.7; IV Piggyback:100] Out: 3100 [Urine:1450; Emesis/NG output:1600; Drains:50] Intake/Output this shift: Total I/O In: -  Out: 175 [Urine:175]  Physical exam:  There is a lot of gas in the colostomy bag abdomen is otherwise soft nontender wounds are clean. Drain is serous only. Nontender calves ostomy is not nearly as dusky hasn't was and appears to be functional.  Lab Results: CBC   Recent Labs  07/08/17 0534  WBC 6.3  HGB 12.2*  HCT 35.0*  PLT 236   BMET  Recent Labs  07/09/17 0630 07/10/17 0524  NA 137 140  K 3.8 3.8  CL 100* 107  CO2 29 26  GLUCOSE 129* 113*  BUN 14 14  CREATININE 0.79 0.75  CALCIUM 8.6* 8.2*   PT/INR No results for input(s): LABPROT, INR in the last 72 hours. ABG No results for input(s): PHART, HCO3 in the last 72 hours.  Invalid input(s): PCO2, PO2  Studies/Results: No results found.  Anti-infectives: Anti-infectives    Start     Dose/Rate Route Frequency Ordered Stop   07/02/17 0200  piperacillin-tazobactam (ZOSYN) IVPB 3.375 g     3.375 g 12.5 mL/hr over 240 Minutes Intravenous Every 8 hours 07/02/17 0150     07/01/17 2115  piperacillin-tazobactam (ZOSYN) IVPB 3.375 g     3.375 g 100 mL/hr over 30 Minutes Intravenous  Once 07/01/17 2103 07/01/17 2236      Assessment/Plan: s/p Procedure(s): COLECTOMY WITH COLOSTOMY CREATION/HARTMANN PROCEDURE   This a  patient with a Hartman's procedure. He is starting putting out gas into his bag he is reluctant to remove his nasogastric tube due to fear of vomiting. Therefore we will clamp his nasogastric tube and start clear liquids and if he tolerates that and the nasogastric tube could be removed later today. Recommend continuing TPN as I expect his diet advancement will be slow.  Florene Glen, MD, FACS  07/10/2017

## 2017-07-10 NOTE — Care Management Important Message (Signed)
Important Message  Patient Details  Name: MATH BRAZIE MRN: [id number] Date of Birth: 1946-07-20   Medicare Important Message Given:  Yes    Beverly Sessions, RN 07/10/2017, 2:48 PM

## 2017-07-10 NOTE — Consult Note (Signed)
[facility] Nurse ostomy follow up  Stoma type/location: LLQ colostomy  Wife at bedside and states she will be caring for him at home.  Wife performs pouch change independently today.  Stomal assessment/size: 1 5/8" slightly oval. Edematous Will use barrier ring Peristomal assessment: intact.  Skin fold at 3 o'clock.  Will fill in with barrier ring Treatment options for stomal/peristomal skin: barrier ring Output Blood tinged liquid at this time.  NG tube in place Ostomy pouching:2pc. 2 3/4" pouch.  Cut off center to accommodate midline surgical staple line  Wife measures and cuts barrier today.  Applies barrier ring and pouch with verbal cueing./  Education provided: Discussed measuring and cutting with wife. To measure at each pouch change for at least six weeks. pouch change and use of barrier ring performed today by wife  Recommended pouch changes twice weekly and how to check for leaks.  Discussed showering and activity questions.   Enrolled patient in [facility] program: YEs will today.  Wife expresses to use the PO BOX address.  [facility] team will follow and remain available to patient, medical and nursing teams.    Domenic Moras RN BSN [place] Pager [phone]

## 2017-07-10 NOTE — Progress Notes (Addendum)
[facility] NOTE   Pharmacy Consult for TPN Indication: prolonged ileus/NPO  Patient Measurements: Height: 5\' 9"  (175.3 cm) Weight: 221 lb 14.4 oz (100.7 kg) IBW/kg (Calculated) : 70.7 TPN AdjBW (KG): 79.8 Body mass index is 32.77 kg/m. Usual Weight:   Assessment: 26 yom now POD 7 (hospital day 8) Hartman's partial colectomy for perforated sigmoid colonic diverticulitis with pneumoperitoneum complicated by prolonged NPO. Patient was on Xarelto for AF with no prior embolic event.   GI: Currently NPO, to start TPN this evening Endo: Not requiring insulin at this time Insulin requirements in the past 24 hours:  Lytes: K 3.8, Mg 2, Phos 3, Ca 8.2, albumin 2.6 (10/11) - no indication for repletion at this time Renal: SCr 0.75 mg/dL - stable Pulm:  Cards:  Hepatobil: LFTs WNL, T bili 1.6 down slightly- will follow Neuro: ID: WBC 6.3 (07/08/17), Tmax 24 98, BP 132/83 to 140/80, HR 70 to 74, RR 18 - Zosyn day 9 for IAI  Best Practices: TPN Access: DL PICC placed 07/07/2017 TPN start date: 07/08/2017  Nutritional Goals (per RD recommendation on 07/07/2017): KCal: 2233 kcal/day Protein: 100 gm/day  Current Nutrition:   Plan:  Continue Clinimix E5/20 to 83 ml/hr (that's goal) - surgeon expects diet advancement to be slow Continue 20% lipid emulsion at 20 ml/hr x 12 hours per day Add MVI, trace, and thiamine 100 mg per day   This provides 100 g of protein and 2233 kCals per day meeting 90% of protein and 100% of kCal needs Add MVI, trace elements, and 100 mg thiamine in TPN Monitor TPN labs per protocol F/U 07/11/2017  Laural Benes, Pharm.D., BCPS Clinical Pharmacist 07/10/2017,10:46 AM

## 2017-07-10 NOTE — Progress Notes (Signed)
Patient remains afraid to remove the nasogastric tube although he has been tolerating clear liquids. We will put a removal ordered for tomorrow morning. He feels good with that decision. I also discussed with pharmacy stopping the Zosyn after Sunday

## 2017-07-10 NOTE — Progress Notes (Signed)
[facility] at [place] NAME: Ryan Mccall    MR#:  [id number]  DATE OF BIRTH:  1945-12-03  SUBJECTIVE: Started on clear liquids today. No abdominal pain.   CHIEF COMPLAINT:   Chief Complaint  Patient presents with  . Abdominal Pain   -Feels about the same. NG with [place] greenish drainage at this time. -Ambulating well in the hallways. Passing flatus  REVIEW OF SYSTEMS:  Review of Systems  Constitutional: Negative for chills, fever and malaise/fatigue.  HENT: Negative for congestion, ear discharge, hearing loss and nosebleeds.   Eyes: Negative for blurred vision and double vision.  Respiratory: Negative for cough, shortness of breath and wheezing.   Cardiovascular: Negative for chest pain, palpitations and leg swelling.  Gastrointestinal: Positive for constipation. Negative for diarrhea, nausea and vomiting.  Genitourinary: Negative for dysuria.  Musculoskeletal: Negative for myalgias.  Neurological: Negative for dizziness, seizures and headaches.    DRUG ALLERGIES:   Allergies  Allergen Reactions  . Shellfish Allergy     VITALS:  Blood pressure 137/86, pulse 85, temperature 97.6 F (36.4 C), temperature source Oral, resp. rate (!) 21, height 5\' 9"  (1.753 m), weight 100.7 kg (221 lb 14.4 oz), SpO2 97 %.  PHYSICAL EXAMINATION:  Physical Exam  GENERAL:  71 y.o.-year-old patient lying in the bed with no acute distress.  EYES: Pupils equal, round, reactive to Frost and accommodation. No scleral icterus. Extraocular muscles intact.  HEENT: Head atraumatic, normocephalic. Oropharynx and nasopharynx clear.  NECK:  Supple, no jugular venous distention. No thyroid enlargement, no tenderness.  LUNGS: Normal breath sounds bilaterally, no wheezing, rales,rhonchi or crepitation. No use of accessory muscles of respiration.  CARDIOVASCULAR: S1, S2 normal. No murmurs, rubs, or gallops.  ABDOMEN: Soft, nontender, less distended.  Bowel sounds  present. No organomegaly or mass.  EXTREMITIES: No pedal edema, cyanosis, or clubbing.  NEUROLOGIC: Cranial nerves II through XII are intact. Muscle strength 5/5 in all extremities. Sensation intact. Gait not checked.  PSYCHIATRIC: The patient is alert and oriented x 3.  SKIN: No obvious rash, lesion, or ulcer.    LABORATORY PANEL:   CBC  Recent Labs Lab 07/08/17 0534  WBC 6.3  HGB 12.2*  HCT 35.0*  PLT 236   ------------------------------------------------------------------------------------------------------------------  Chemistries   Recent Labs Lab 07/09/17 0630 07/10/17 0524  NA 137 140  K 3.8 3.8  CL 100* 107  CO2 29 26  GLUCOSE 129* 113*  BUN 14 14  CREATININE 0.79 0.75  CALCIUM 8.6* 8.2*  MG 2.1 2.0  AST 30  --   ALT 29  --   ALKPHOS 60  --   BILITOT 1.6*  --    ------------------------------------------------------------------------------------------------------------------  Cardiac Enzymes No results for input(s): TROPONINI in the last 168 hours. ------------------------------------------------------------------------------------------------------------------  RADIOLOGY:  No results found.  EKG:   Orders placed or performed during the hospital encounter of 07/01/17  . ED EKG  . ED EKG    ASSESSMENT AND PLAN:   71 [facility] tonight[facility]. Patient reports his lower abdominal pain began 1.5 days ago (10/2) after he experienced acute on chronic intermittent constipation 3 days ago (10/1) for which patient took milk of magnesia twice after the first dose did not seem to help. He was found to have acute sigmoid diverticulitis with perforation.  *Acute Sigmoid Diverticulitis with perforation S/p Exploratory laparotomy and sigmoid colon resection colostomy (Hartman's procedure) on 07/03/17 Passing flatus. Further management per surgical team. -Still has JP  drains in -started on clear liquids started on TPN on  07/08/17 - on zosyn- stop after 10 days -Xarelto held  * Hypertension - well controlled Resume coreg, Avapro, Coreg once NG tube  Is  disconnected and likely to start from tomorrow. * A fib Resume xarelto per surgery when able to Monitor HR  * Hx of CAD s/p CABG Stable  * DVT Prophylaxis- on lovenox   Spoke with patient and wife at bedside     All the records are reviewed and case discussed with Care Management/Social Workerr. Management plans discussed with the patient, family and they are in agreement.  CODE STATUS: Full Code  TOTAL TIME TAKING CARE OF THIS PATIENT: 37 minutes.   POSSIBLE D/C IN 2 DAYS, DEPENDING ON CLINICAL CONDITION.   Epifanio Lesches M.D on 07/10/2017 at 11:48 AM  Between 7am to 6pm - Pager - [phone]  After 6pm go to [email or web address] - password [email or web address] [facility]  Office  [phone]  CC: Primary care physician; Lillard Anes, MD

## 2017-07-11 LAB — BASIC METABOLIC PANEL
Anion gap: 6 (ref 5–15)
BUN: 16 mg/dL (ref 6–20)
CALCIUM: 8.4 mg/dL — AB (ref 8.9–10.3)
CHLORIDE: 105 mmol/L (ref 101–111)
CO2: 27 mmol/L (ref 22–32)
CREATININE: 0.66 mg/dL (ref 0.61–1.24)
Glucose, Bld: 151 mg/dL — ABNORMAL HIGH (ref 65–99)
Potassium: 4 mmol/L (ref 3.5–5.1)
SODIUM: 138 mmol/L (ref 135–145)

## 2017-07-11 LAB — MAGNESIUM: MAGNESIUM: 2.1 mg/dL (ref 1.7–2.4)

## 2017-07-11 LAB — PHOSPHORUS: PHOSPHORUS: 3.1 mg/dL (ref 2.5–4.6)

## 2017-07-11 MED ORDER — CARVEDILOL 6.25 MG PO TABS
3.1250 mg | ORAL_TABLET | Freq: Two times a day (BID) | ORAL | Status: DC
  Administered 2017-07-11 – 2017-07-14 (×4): 3.125 mg via ORAL
  Filled 2017-07-11 (×5): qty 1

## 2017-07-11 MED ORDER — CLINIMIX E/DEXTROSE (5/20) 5 % IV SOLN
INTRAVENOUS | Status: AC
  Administered 2017-07-11: 17:00:00 via INTRAVENOUS
  Filled 2017-07-11: qty 1992

## 2017-07-11 MED ORDER — FAT EMULSION 20 % IV EMUL
250.0000 mL | INTRAVENOUS | Status: AC
  Administered 2017-07-11: 250 mL via INTRAVENOUS
  Filled 2017-07-11: qty 250

## 2017-07-11 MED ORDER — RIVAROXABAN 20 MG PO TABS
20.0000 mg | ORAL_TABLET | Freq: Every day | ORAL | Status: DC
  Administered 2017-07-11 – 2017-07-13 (×3): 20 mg via ORAL
  Filled 2017-07-11 (×4): qty 1

## 2017-07-11 MED ORDER — IRBESARTAN 150 MG PO TABS
300.0000 mg | ORAL_TABLET | Freq: Every day | ORAL | Status: DC
  Administered 2017-07-11 – 2017-07-12 (×2): 300 mg via ORAL
  Filled 2017-07-11 (×3): qty 2

## 2017-07-11 NOTE — Progress Notes (Signed)
[facility] at [place] NAME: Ryan Mccall    MR#:  [id number]  DATE OF BIRTH:  Jul 01, 1946 NG tube is  disconnected morning. Tolerating clear liquids. Denies any abdominal pain. The JP drains is removed. The colostomy bag is filled with  Some yellow  stool. CHIEF COMPLAINT:   Chief Complaint  Patient presents with  . Abdominal Pain   . -Ambulating well in the hallways. Passing flatus  REVIEW OF SYSTEMS:  Review of Systems  Constitutional: Negative for chills, fever and malaise/fatigue.  HENT: Negative for congestion, ear discharge, hearing loss and nosebleeds.   Eyes: Negative for blurred vision and double vision.  Respiratory: Negative for cough, shortness of breath and wheezing.   Cardiovascular: Negative for chest pain, palpitations and leg swelling.  Gastrointestinal: Negative for constipation, diarrhea, nausea and vomiting.  Genitourinary: Negative for dysuria.  Musculoskeletal: Negative for myalgias.  Neurological: Negative for dizziness, seizures and headaches.    DRUG ALLERGIES:   Allergies  Allergen Reactions  . Shellfish Allergy     VITALS:  Blood pressure 138/83, pulse 91, temperature 98.3 F (36.8 C), temperature source Oral, resp. rate 20, height 5\' 9"  (1.753 m), weight 102 kg (224 lb 12.8 oz), SpO2 97 %.  PHYSICAL EXAMINATION:  Physical Exam  GENERAL:  71 y.o.-year-old patient lying in the bed with no acute distress.  EYES: Pupils equal, round, reactive to Setzler and accommodation. No scleral icterus. Extraocular muscles intact.  HEENT: Head atraumatic, normocephalic. Oropharynx and nasopharynx clear.  NECK:  Supple, no jugular venous distention. No thyroid enlargement, no tenderness.  LUNGS: Normal breath sounds bilaterally, no wheezing, rales,rhonchi or crepitation. No use of accessory muscles of respiration.  CARDIOVASCULAR: S1, S2 normal. No murmurs, rubs, or gallops.  ABDOMEN: Soft, nontender, less distended.   Bowel sounds present. No organomegaly or mass. Dressing present in the midline, colostomy present in the left lower quadrant,JP drain is present in the left side. EXTREMITIES: No pedal edema, cyanosis, or clubbing.  NEUROLOGIC: Cranial nerves II through XII are intact. Muscle strength 5/5 in all extremities. Sensation intact. Gait not checked.  PSYCHIATRIC: The patient is alert and oriented x 3.  SKIN: No obvious rash, lesion, or ulcer.    LABORATORY PANEL:   CBC  Recent Labs Lab 07/08/17 0534  WBC 6.3  HGB 12.2*  HCT 35.0*  PLT 236   ------------------------------------------------------------------------------------------------------------------  Chemistries   Recent Labs Lab 07/09/17 0630  07/11/17 0413  NA 137  < > 138  K 3.8  < > 4.0  CL 100*  < > 105  CO2 29  < > 27  GLUCOSE 129*  < > 151*  BUN 14  < > 16  CREATININE 0.79  < > 0.66  CALCIUM 8.6*  < > 8.4*  MG 2.1  < > 2.1  AST 30  --   --   ALT 29  --   --   ALKPHOS 60  --   --   BILITOT 1.6*  --   --   < > = values in this interval not displayed. ------------------------------------------------------------------------------------------------------------------  Cardiac Enzymes No results for input(s): TROPONINI in the last 168 hours. ------------------------------------------------------------------------------------------------------------------  RADIOLOGY:  No results found.  EKG:   Orders placed or performed during the hospital encounter of 07/01/17  . ED EKG  . ED EKG    ASSESSMENT AND PLAN:   71 y.[facility] tonightfor [facility]. Patient reports his lower abdominal pain began 1.5 days  ago (10/2) after he experienced acute on chronic intermittent constipation 3 days ago (10/1) for which patient took milk of magnesia twice after the first dose did not seem to help. He was found to have acute sigmoid diverticulitis with perforation.  *Acute Sigmoid Diverticulitis with  perforation S/p Exploratory laparotomy and sigmoid colon resection colostomy (Hartman's procedure) on 07/03/17 Passing flatus. Further management per surgical team. -Still has  One  Left side JP drains in  on TPN on 07/08/17 - on zosyn- stop after 10 days -Xarelto held Tolerating clear liquids.   * Hypertension - well controlled Resume coreg, Avapro, Coreg today * A fib Resume xarelto per surgery when able to Monitor HR  * Hx of CAD s/p CABG Stable  * DVT Prophylaxis- on lovenox   Spoke with patient and wife at bedside     All the records are reviewed and case discussed with Care Management/Social Workerr. Management plans discussed with the patient, family and they are in agreement.  CODE STATUS: Full Code  TOTAL TIME TAKING CARE OF THIS PATIENT: 37 minutes.   POSSIBLE D/C IN 2 DAYS, DEPENDING ON CLINICAL CONDITION.   Epifanio Lesches M.D on 07/11/2017 at 11:08 AM  Between 7am to 6pm - Pager - [phone]  After 6pm go to [email or web address] - password [email or web address] [facility]  Office  [phone]  CC: Primary care physician; Lillard Anes, MD

## 2017-07-11 NOTE — Plan of Care (Signed)
Problem: Activity: Goal: Risk for activity intolerance will decrease Outcome: Progressing Ambulated in hall multiple times throughout the day, accompanied by his wife. Tolerated well.

## 2017-07-11 NOTE — Progress Notes (Signed)
[facility] CONSULT NOTE   Pharmacy Consult for TPN Indication: prolonged ileus/NPO  Patient Measurements: Height: 5\' 9"  (175.3 cm) Weight: 224 lb 12.8 oz (102 kg) IBW/kg (Calculated) : 70.7 TPN AdjBW (KG): 79.8 Body mass index is 33.2 kg/m. Usual Weight:   Assessment: 10 yom Hartman's partial colectomy for perforated sigmoid colonic diverticulitis with pneumoperitoneum complicated by prolonged NPO. Patient was on Xarelto for AF with no prior embolic event.   GI: Currently NPO, to start TPN this evening Endo: Not requiring insulin at this time Insulin requirements in the past 24 hours:  Lytes: K 3.8, Mg 2, Phos 3, Ca 8.2, albumin 2.6 (10/11) - no indication for repletion at this time Renal: SCr 0.75 mg/dL - stable Pulm:  Cards:  Hepatobil: LFTs WNL, T bili 1.6 down slightly- will follow Neuro: ID: WBC 6.3 (07/08/17), Tmax 24 98, BP 132/83 to 140/80, HR 70 to 74, RR 18 -   Best Practices: TPN Access: DL PICC placed 07/07/2017 TPN start date: 07/08/2017  Nutritional Goals (per RD recommendation on 07/07/2017): KCal: 2233 kcal/day Protein: 100 gm/day  Current Nutrition:   Plan:  Continue Clinimix E5/20 to 83 ml/hr (that's goal) - surgeon expects diet advancement to be slow Continue 20% lipid emulsion at 20 ml/hr x 12 hours per day Add MVI, trace, and thiamine 100 mg per day   This provides 100 g of protein and 2233 kCals per day meeting 90% of protein and 100% of kCal needs Add MVI, trace elements, and 100 mg thiamine in TPN Monitor TPN labs per protocol  F/U 07/12/2017  Domonic Kimball C, Pharm.D., BCPS Clinical Pharmacist 07/11/2017,11:31 AM

## 2017-07-11 NOTE — Progress Notes (Signed)
[facility] Hospital Day(s): 9.   Post op day(s): 8 Days Post-Op.   Interval History: Patient seen and examined, no acute events or new complaints overnight. Patient reports tolerating clear liquids diet with gas and stool in ostomy bag, NG tube removed this morning, denies N/V, fever/chills, abdominal pain, CP, or SOB.  Review of Systems:  Constitutional: denies fever, chills  HEENT: denies cough or congestion  Respiratory: denies any shortness of breath  Cardiovascular: denies chest pain or palpitations  Gastrointestinal: abdominal pain, N/V, and bowel function as per interval history Genitourinary: denies burning with urination or urinary frequency Musculoskeletal: denies pain, decreased motor or sensation Integumentary: denies any other rashes or skin discolorations except post-surgical abdominal wounds Neurological: denies HA or vision/hearing changes   Vital signs in last 24 hours: [min-max] current  Temp:  [97.6 F (36.4 C)-98.3 F (36.8 C)] 98.3 F (36.8 C) (10/13 0432) Pulse Rate:  [81-92] 91 (10/13 0432) Resp:  [18-20] 20 (10/13 0432) BP: (123-138)/(77-86) 138/83 (10/13 0432) SpO2:  [96 %-98 %] 97 % (10/13 0432) Weight:  [224 lb 12.8 oz (102 kg)] 224 lb 12.8 oz (102 kg) (10/13 0500)     Height: 5\' 9"  (175.3 cm) Weight: 224 lb 12.8 oz (102 kg) BMI (Calculated): 33.18   Intake/Output this shift:  Total I/O In: 240 [P.O.:240] Out: 650 [Urine:650]   B/L JP drains: 10 mL serosanguinous fluid / 24 hours   Intake/Output last 2 shifts:  @IOLAST2SHIFTS @   Physical Exam:  Constitutional: alert, cooperative and no distress  HENT: normocephalic without obvious abnormality  Eyes: PERRL, EOM's grossly intact and symmetric  Neuro: CN II - XII grossly intact and symmetric without deficit  Respiratory: breathing non-labored at rest  Cardiovascular: regular rate and sinus rhythm  Gastrointestinal: soft, obese, and non-tender to palpation, scant serosanguinous  drainage from base of vertical midline laparotomy wound dressed with dry gauze and ABD, no surrounding erythema, gas and stool in ostomy bag, ostomy somewhat dusky (unchanged) Musculoskeletal: UE and LE FROM, no edema or wounds, motor and sensation grossly intact, NT   Labs:  CBC Latest Ref Rng & Units 07/08/2017 07/04/2017 07/03/2017  WBC 3.8 - 10.6 K/uL 6.3 9.2 11.5(H)  Hemoglobin 13.0 - 18.0 g/dL 12.2(L) 11.9(L) 13.3  Hematocrit 40.0 - 52.0 % 35.0(L) 33.9(L) 37.5(L)  Platelets 150 - 440 K/uL 236 171 179   CMP Latest Ref Rng & Units 07/11/2017 07/10/2017 07/09/2017  Glucose 65 - 99 mg/dL 151(H) 113(H) 129(H)  BUN 6 - 20 mg/dL 16 14 14   Creatinine 0.61 - 1.24 mg/dL 0.66 0.75 0.79  Sodium 135 - 145 mmol/L 138 140 137  Potassium 3.5 - 5.1 mmol/L 4.0 3.8 3.8  Chloride 101 - 111 mmol/L 105 107 100(L)  CO2 22 - 32 mmol/L 27 26 29   Calcium 8.9 - 10.3 mg/dL 8.4(L) 8.2(L) 8.6(L)  Total Protein 6.5 - 8.1 g/dL - - 6.6  Total Bilirubin 0.3 - 1.2 mg/dL - - 1.6(H)  Alkaline Phos 38 - 126 U/L - - 60  AST 15 - 41 U/L - - 30  ALT 17 - 63 U/L - - 29   Imaging studies: No new pertinent imaging studies   Assessment/Plan:(ICD-10's: K57.20) 71 y.o.malewith slowly resolving post-operative ileus 8Days Post-Opon TPN s/p Hartman's partial colectomy with creation of end colostomy for perforated sigmoid colonic diverticulitis with increased pneumoperitoneum, complicated by prolonged NPO and by pertinent comorbidities including chronic Xarelto anticoagulation for atrial fibrillation without prior embolic event, CAD s/p CABG (1999) for ACS without  MI, HTN, hypercholesterolemia, and squamous cell carcinoma of abdominal wall.  - clear liquids this morning - pain control prn, minimize narcotics  - advance to full liquids PM or tomorrow morning - continue TPN via PICC for today, possible 1/2 tomorrow             - RLQ JP drain removed today with only 10 mL serosanguinous  fluid from drain/24 hours  - will likely plan to remove LLQ JP drain tomorrow if equally little serosanguinous fluid draining before antibiotics discontinued - Zosyn to be discontinued after tomorrow per Dr. Antionette Char note re: discussion with pharmacy - medical management of medical comorbidities - restart Xarelto, ambulation encouraged  - discharge planning ~Tues/Weds  All of the above findings and recommendations were discussed with the patient, patient's family, and patient's RN, and all of patient's and family's questions were answered to their expressed satisfaction.  -- Marilynne Drivers Rosana Hoes, MD, [place]: [facility] - Partnering for exceptional care. Office: [phone]

## 2017-07-12 LAB — CBC
HEMATOCRIT: 37.4 % — AB (ref 40.0–52.0)
Hemoglobin: 12.8 g/dL — ABNORMAL LOW (ref 13.0–18.0)
MCH: 30.5 pg (ref 26.0–34.0)
MCHC: 34.2 g/dL (ref 32.0–36.0)
MCV: 89.2 fL (ref 80.0–100.0)
Platelets: 241 10*3/uL (ref 150–440)
RBC: 4.19 MIL/uL — ABNORMAL LOW (ref 4.40–5.90)
RDW: 14.8 % — AB (ref 11.5–14.5)
WBC: 10 10*3/uL (ref 3.8–10.6)

## 2017-07-12 MED ORDER — FAT EMULSION 20 % IV EMUL
250.0000 mL | INTRAVENOUS | Status: AC
  Administered 2017-07-12: 250 mL via INTRAVENOUS
  Filled 2017-07-12: qty 250

## 2017-07-12 MED ORDER — TRACE MINERALS CR-CU-MN-SE-ZN 10-1000-500-60 MCG/ML IV SOLN
INTRAVENOUS | Status: AC
  Administered 2017-07-12: 19:00:00 via INTRAVENOUS
  Filled 2017-07-12: qty 1008

## 2017-07-12 NOTE — Progress Notes (Signed)
[facility] [facility] CONSULT NOTE   Pharmacy Consult for TPN Indication: prolonged ileus/NPO  Patient Measurements: Height: 5\' 9"  (175.3 cm) Weight: 223 lb 6.4 oz (101.3 kg) IBW/kg (Calculated) : 70.7 TPN AdjBW (KG): 79.8 Body mass index is 32.99 kg/m. Usual Weight:   Assessment: 34 yom Hartman's partial colectomy for perforated sigmoid colonic diverticulitis with pneumoperitoneum complicated by prolonged NPO. Patient was on Xarelto for AF with no prior embolic event.     Best Practices: TPN Access: DL PICC placed 07/07/2017 TPN start date: 07/08/2017  Nutritional Goals (per RD recommendation on 07/07/2017): KCal: 2233 kcal/day Protein: 100 gm/day  Current Nutrition:  Continue Clinimix E5/20 to 83 ml/hr  Plan:  Patient tolerating diet. Spoke with dietician and surgery would like TPN to reduce to half rate tonight and stop tomorrow. Will order Clinimix E 5/20 at 42ml/hr Continue 20% lipid emulsion at 20 ml/hr x 12 hours per day Add MVI, trace, and thiamine 100 mg per day  Monitor TPN labs per protocol  Elayjah Chaney C, Pharm.D., BCPS Clinical Pharmacist 07/12/2017,2:49 PM

## 2017-07-12 NOTE — Progress Notes (Signed)
[place] [facility] Day(s): 10.   Post op day(s): 9 Days Post-Op.   Interval History: Patient seen and examined, no acute events or new complaints overnight. Patient reports ambulating around the halls and tolerating full liquids diet with stool and gas from ostomy without N/V and with mild-minimal peri-incisional tenderness to palpation, denies fever/chills, CP, or SOB.  Review of Systems:  Constitutional: denies fever, chills  HEENT: denies cough or congestion  Respiratory: denies any shortness of breath  Cardiovascular: denies chest pain or palpitations  Gastrointestinal: abdominal pain, N/V, and bowel function as per interval history Genitourinary: denies burning with urination or urinary frequency Musculoskeletal: denies pain, decreased motor or sensation Integumentary: denies any other rashes or skin discolorations except post-surgical abdominal wounds Neurological: denies HA or vision/hearing changes   Vital signs in last 24 hours: [min-max] current  Temp:  [97.5 F (36.4 C)-98.1 F (36.7 C)] 97.5 F (36.4 C) (10/14 0519) Pulse Rate:  [72-94] 72 (10/14 0519) Resp:  [16-18] 18 (10/14 0519) BP: (101-116)/(60-72) 111/67 (10/14 0519) SpO2:  [93 %-99 %] 98 % (10/14 0519) Weight:  [223 lb 6.4 oz (101.3 kg)] 223 lb 6.4 oz (101.3 kg) (10/14 0519)     Height: 5\' 9"  (175.3 cm) Weight: 223 lb 6.4 oz (101.3 kg) BMI (Calculated): 32.98   Intake/Output this shift:  Total I/O In: 1259 [I.V.:1227; IV Piggyback:32] Out: 990 [Urine:900; Drains:20; Stool:70]   Intake/Output last 2 shifts:  @IOLAST2SHIFTS @   Physical Exam:  Constitutional: alert, cooperative and no distress  HENT: normocephalic without obvious abnormality  Eyes: PERRL, EOM's grossly intact and symmetric  Neuro: CN II - XII grossly intact and symmetric without deficit  Respiratory: breathing non-labored at rest  Cardiovascular: regular rate and sinus rhythm  Gastrointestinal: soft, obese, and  non-tender to palpation, scant serosanguinous drainage from base of vertical midline laparotomy wound dressed with dry gauze and ABD, no surrounding erythema, gas and stool in ostomy bag, ostomy somewhat dusky (unchanged), one remaining LLQ JP drain well-secured with scant clear serosanguinous fluid in the bulb Musculoskeletal: UE and LE FROM, motor and sensation grossly intact, NT   Labs:  CBC Latest Ref Rng & Units 07/12/2017 07/08/2017 07/04/2017  WBC 3.8 - 10.6 K/uL 10.0 6.3 9.2  Hemoglobin 13.0 - 18.0 g/dL 12.8(L) 12.2(L) 11.9(L)  Hematocrit 40.0 - 52.0 % 37.4(L) 35.0(L) 33.9(L)  Platelets 150 - 440 K/uL 241 236 171   CMP Latest Ref Rng & Units 07/11/2017 07/10/2017 07/09/2017  Glucose 65 - 99 mg/dL 151(H) 113(H) 129(H)  BUN 6 - 20 mg/dL 16 14 14   Creatinine 0.61 - 1.24 mg/dL 0.66 0.75 0.79  Sodium 135 - 145 mmol/L 138 140 137  Potassium 3.5 - 5.1 mmol/L 4.0 3.8 3.8  Chloride 101 - 111 mmol/L 105 107 100(L)  CO2 22 - 32 mmol/L 27 26 29   Calcium 8.9 - 10.3 mg/dL 8.4(L) 8.2(L) 8.6(L)  Total Protein 6.5 - 8.1 g/dL - - 6.6  Total Bilirubin 0.3 - 1.2 mg/dL - - 1.6(H)  Alkaline Phos 38 - 126 U/L - - 60  AST 15 - 41 U/L - - 30  ALT 17 - 63 U/L - - 29   Imaging studies: No new pertinent imaging studies   Assessment/Plan:(ICD-10's: K57.20) 71 y.o.malewith slowly resolvingpost-operative ileus 8Days Post-Opon TPN s/p Hartman's 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  - advance to soft diet - pain control prn, minimize narcotics - half rate of TPN via PICC tonight and  anticipate stop TPN tomorrow night - LLQ JP drain removed today with only 10 mL serosanguinous fluid from drain/24 hours             - will likely plan to remove LLQ JP drain tomorrow if equally little serosanguinous fluid draining before antibiotics discontinued - Zosyn to be discontinued after Sunday (today) per Dr. Antionette Char note re: discussion with pharmacy  - follow up/trend morning WBC, medical management of comorbidities - Xarelto restarted, ambulation encouraged             - discharge planning ~Tues/Weds  All of the above findings and recommendations were discussed with the patient, patient's family, and patient's RN, and all of patient's and family's questions were answered to their expressed satisfaction.  -- Marilynne Drivers Rosana Hoes, MD, [facility] - Partnering for exceptional care. Office: [phone]

## 2017-07-12 NOTE — Progress Notes (Signed)
Tolerating Full liquid diet, patient started cardiac medication including Xarelto. No further follow-up needed we will follow and make further recommendation if needed. Continue Xarelto, full liquid diet, stop TPN tomorrow as per surgery note. Discontinue zosynafter today  Dose.. Likely discharge Tuesday or Wednesday Time; spent'15 min

## 2017-07-13 ENCOUNTER — Telehealth: Payer: Self-pay | Admitting: General Practice

## 2017-07-13 LAB — COMPREHENSIVE METABOLIC PANEL
ALBUMIN: 2.7 g/dL — AB (ref 3.5–5.0)
ALT: 34 U/L (ref 17–63)
AST: 31 U/L (ref 15–41)
Alkaline Phosphatase: 67 U/L (ref 38–126)
Anion gap: 4 — ABNORMAL LOW (ref 5–15)
BUN: 20 mg/dL (ref 6–20)
CHLORIDE: 109 mmol/L (ref 101–111)
CO2: 25 mmol/L (ref 22–32)
CREATININE: 0.78 mg/dL (ref 0.61–1.24)
Calcium: 8.4 mg/dL — ABNORMAL LOW (ref 8.9–10.3)
GFR calc Af Amer: >60 mL/min (ref >60)
GFR calc non Af Amer: >60 mL/min (ref >60)
Glucose, Bld: 129 mg/dL — ABNORMAL HIGH (ref 65–99)
POTASSIUM: 4.4 mmol/L (ref 3.5–5.1)
SODIUM: 138 mmol/L (ref 135–145)
Total Bilirubin: 0.9 mg/dL (ref 0.3–1.2)
Total Protein: 5.5 g/dL — ABNORMAL LOW (ref 6.5–8.1)

## 2017-07-13 LAB — MAGNESIUM: Magnesium: 2.1 mg/dL (ref 1.7–2.4)

## 2017-07-13 LAB — CBC
HEMATOCRIT: 35.7 % — AB (ref 40.0–52.0)
HEMOGLOBIN: 12.2 g/dL — AB (ref 13.0–18.0)
MCH: 30.3 pg (ref 26.0–34.0)
MCHC: 34 g/dL (ref 32.0–36.0)
MCV: 88.9 fL (ref 80.0–100.0)
Platelets: 243 10*3/uL (ref 150–440)
RBC: 4.02 MIL/uL — AB (ref 4.40–5.90)
RDW: 14.9 % — ABNORMAL HIGH (ref 11.5–14.5)
WBC: 10.9 10*3/uL — ABNORMAL HIGH (ref 3.8–10.6)

## 2017-07-13 LAB — DIFFERENTIAL
BASOS ABS: 0.1 10*3/uL (ref 0–0.1)
Basophils Relative: 1 %
EOS ABS: 0.2 10*3/uL (ref 0–0.7)
Eosinophils Relative: 2 %
LYMPHS ABS: 1.5 10*3/uL (ref 1.0–3.6)
LYMPHS PCT: 14 %
Monocytes Absolute: 0.9 10*3/uL (ref 0.2–1.0)
Monocytes Relative: 8 %
Neutro Abs: 8.2 10*3/uL — ABNORMAL HIGH (ref 1.4–6.5)
Neutrophils Relative %: 75 %

## 2017-07-13 LAB — PREALBUMIN: PREALBUMIN: 24.4 mg/dL (ref 18–38)

## 2017-07-13 LAB — PHOSPHORUS: PHOSPHORUS: 3.3 mg/dL (ref 2.5–4.6)

## 2017-07-13 LAB — TRIGLYCERIDES: Triglycerides: 64 mg/dL (ref <150)

## 2017-07-13 MED ORDER — PREMIER PROTEIN SHAKE
11.0000 [oz_av] | Freq: Two times a day (BID) | ORAL | Status: DC
  Administered 2017-07-13 – 2017-07-14 (×2): 11 [oz_av] via ORAL

## 2017-07-13 MED ORDER — IRBESARTAN 150 MG PO TABS
75.0000 mg | ORAL_TABLET | Freq: Every day | ORAL | Status: DC
  Administered 2017-07-14: 75 mg via ORAL
  Filled 2017-07-13: qty 1

## 2017-07-13 MED ORDER — ENSURE ENLIVE PO LIQD: 237.0000 mL | Freq: Two times a day (BID) | ORAL | Status: DC

## 2017-07-13 NOTE — Progress Notes (Signed)
Held BP meds for AM administration d/t Hypotension (See VS Flowsheet).  Will continue to monitor.

## 2017-07-13 NOTE — Consult Note (Signed)
[place] Nurse ostomy follow up Stoma type/location: LLQ colostomy.  Functioning with brown soft stool in pouch Stomal assessment/size: sloughing stomal tissue reveals pink patent stoma.  Peristomal assessment: intact, midline abdominal incision and JP drains present.  Treatment options for stomal/peristomal skin: barrier ring Output soft brown stool Ostomy pouching: 2pc. 2 3/4" pouch with barrier ring  Education provided: Wife performs pouch change.  Stoma has changed somewhat in size with minimal separation at 9 o'clock.  This area is protected with the barrier ring and explained to wife. Patter is sent home and she is reminded to measure at each pouch change and demonstrates how to do this today.  She applies new pouch system with minimal cueing.  Enrolled patient in [place] Start Discharge program: Yes [place] team will follow.  Domenic Moras RN BSN [place] Pager [phone]

## 2017-07-13 NOTE — Progress Notes (Signed)
S/p hartmann's  Doing well Ileus reolved AVSS Ostomy working  PE NAD Abd: soft, incision staples in place some erythema. Colostomy working well  A/ pDoing well DC TPN DC in am

## 2017-07-13 NOTE — Progress Notes (Signed)
,   patient started cardiac medication including Xarelto. Hypertension today so I adjusted dose of Avapro. Time spent;15 min

## 2017-07-13 NOTE — Telephone Encounter (Signed)
Patients wife came by the office and dropped off fmla paperwork to be filled out for herself, she has paid the 25.00 fee. Please fax and call patients wife soon as paperwork has been faxed over.

## 2017-07-13 NOTE — Progress Notes (Signed)
Nutrition Follow Up Note   DOCUMENTATION CODES:   Obesity unspecified  INTERVENTION:   Recommend discontinue TPN  Add Ensure Enlive po BID, each supplement provides 350 kcal and 20 grams of protein  NUTRITION DIAGNOSIS:   Inadequate oral intake related to acute illness as evidenced by NPO status. resolving  GOAL:   Patient will meet greater than or equal to 90% of their needs- progressing  MONITOR:   PO intake, Supplement acceptance, Weight trends, Labs  ASSESSMENT:   71 y.o. male with perforated sigmoid colonic diverticulitis with pneumoperitoneum now s/p Hartman's partial colectomy with creation of end colostomy 50/3, complicated by pertinent comorbidities including chronic Xarelto anticoagulation for atrial fibrillation without prior embolic event, CAD s/p CABG (1999) for ACS without MI, HTN, hypercholesterolemia, and squamous cell carcinoma of abdominal wall.   Pt doing well. Advanced to soft diet yesterday and eating 75-100% of meals. TPN running at half goal rate @41ml /hr. Recommend discontinue TPN today. RD will add Ensure. Per chart pt with initial 15lb weight loss after admit but has regained 4lbs since TPN initiation. NGT pulled 10/13. Passing flatus and stool.      Medications reviewed:  Labs reviewed: K 4.4 wnl, Ca 8.4(L) adj. 9.44 wnl, P 3.3 wnl, Mg 2.1 wnl, alb 2.7(L) Prealbumin- 13.4(L)- 10/10 triglycerides 64 Wbc- 10.9(H) cbgs- 151, 129 x 48 hrs  Diet Order:  DIET SOFT Room service appropriate? Yes; Fluid consistency: Thin .TPN (CLINIMIX-E) Adult  Skin:  Wound (see comment) (incision abdomen )  Last BM:  10/15- 108ml  Height:   Ht Readings from Last 1 Encounters:  07/03/17 5\' 9"  (1.753 m)    Weight:   Wt Readings from Last 1 Encounters:  07/13/17 225 lb 9.6 oz (102.3 kg)    Ideal Body Weight:  72.7 kg  BMI:  Body mass index is 33.32 kg/m.  Estimated Nutritional Needs:   Kcal:  2100-2400kcal/day   Protein:  107-128g/day   Fluid:   >2L/day   EDUCATION NEEDS:   Education needs addressed  Koleen Distance MS, RD, LDN Pager #[phone] After Hours Pager: [phone]

## 2017-07-14 MED ORDER — HYDROCODONE-ACETAMINOPHEN 5-325 MG PO TABS: 1.0000 | ORAL_TABLET | Freq: Four times a day (QID) | ORAL | 0 refills | Status: DC | PRN

## 2017-07-14 NOTE — Discharge Summary (Signed)
Patient ID: Ryan Mccall MRN: [id number] DOB/AGE: April 29, 1946 71 y.o.  Admit date: 07/01/2017 Discharge date: 07/14/2017   Discharge Diagnoses:  Active Problems:   Diverticulitis of large intestine with perforation without abscess   Perforation of sigmoid colon due to diverticulitis   Procedures:Hartmann's procedure  Hospital Course:  -year-old male with multiple comorbidities including coronary artery disease, A. fib on anticoagulation. Presented to emergency room with abdominal pain and CT steak and finding consistent with microperforation from diverticulitis. He was managed medically initially and did develop an ileus. Unfortunately clinical condition deteriorated and more importantly follow-up films show evidence of free air. At that time Dr. Burt Knack decided to take the patient to the operating room for exploratory laparotomy and Hartman's procedure. He did well after the procedure but and continued an ileus and TPN was started as well as broad-spectrum antibiotic therapy. He continued to improve and his bowel function resumed and. His NG tube was removed and he was weaned off TPN. His diet was advanced and he tolerated well. At time of discharge she was ambulating, tolerating regular diet and his colostomy was having good output. Physical exam showed a male in no acute distress. Awake and alert. Abdomen soft incision healing well with some evidence of a couple of scabs within the incision was serous fluid. No evidence of wound infection. Colostomy working well. Ext: well perfused, no edema. Condition at the time of DC is stable   Disposition: Final discharge disposition not confirmed  Discharge Instructions    Call MD for:  difficulty breathing, headache or visual disturbances    Complete by:  As directed    Call MD for:  persistant dizziness or Dutkiewicz-headedness    Complete by:  As directed    Call MD for:  persistant nausea and vomiting    Complete by:  As directed    Call MD for:   redness, tenderness, or signs of infection (pain, swelling, redness, odor or green/yellow discharge around incision site)    Complete by:  As directed    Call MD for:  severe uncontrolled pain    Complete by:  As directed    Call MD for:  temperature >100.4    Complete by:  As directed    Change dressing (specify)    Complete by:  As directed    Dressing change:BID w dry gauze   Diet - low sodium heart healthy    Complete by:  As directed    Increase activity slowly    Complete by:  As directed    Lifting restrictions    Complete by:  As directed    20 lbs x 6 wks     Allergies as of 07/14/2017      Reactions   Shellfish Allergy       Medication List    TAKE these medications   carvedilol 12.5 MG tablet Commonly known as:  COREG Take 1.5 tablets by mouth twice a day   COD LIVER OIL PO Take 1 capsule by mouth daily.   ezetimibe 10 MG tablet Commonly known as:  ZETIA Take 10 mg by mouth daily.   fexofenadine 180 MG tablet Commonly known as:  ALLEGRA Take 180 mg by mouth daily.   fish oil-omega-3 fatty acids 1000 MG capsule Take 1 g by mouth daily.   HYDROcodone-acetaminophen 5-325 MG tablet Commonly known as:  NORCO/VICODIN Take 1-2 tablets by mouth every 6 (six) hours as needed for moderate pain.   irbesartan 300 MG tablet Commonly known  as:  AVAPRO Take 1 tablet (300 mg total) by mouth daily.   multivitamin tablet Take 1 tablet by mouth every other day.   nitroGLYCERIN 0.4 MG SL tablet Commonly known as:  NITROSTAT Place 0.4 mg under the tongue as needed for chest pain. May repeat in 15 minutes up to 3 times for chest pain.   rivaroxaban 20 MG Tabs tablet Commonly known as:  XARELTO Take 1 tablet (20 mg total) by mouth daily with supper.   simvastatin 10 MG tablet Commonly known as:  ZOCOR Take 10 mg by mouth at bedtime.   spironolactone 25 MG tablet Commonly known as:  ALDACTONE Take 0.5 tablets (12.5 mg total) by mouth daily.             Discharge Care Instructions        Start     Ordered   07/14/17 0000  Change dressing (specify)    Comments:  Dressing change:BID w dry gauze   07/14/17 2449     Follow-up Information    Florene Glen, MD. Go on 07/23/2017.   Specialty:  Surgery Why:  Thursday At 2:30pm Follow-up with Dr Burt Knack Please Arrive 54mins early Contact information: [place] [address] [phone]            Caroleen Hamman, MD FACS

## 2017-07-14 NOTE — Discharge Instructions (Signed)
Diverticulitis Diverticulitis is inflammation or infection of small pouches in your colon that form when you have a condition called diverticulosis. The pouches in your colon are called diverticula. Your colon, or large intestine, is where water is absorbed and stool is formed. Complications of diverticulitis can include:  Bleeding.  Severe infection.  Severe pain.  Perforation of your colon.  Obstruction of your colon.  What are the causes? Diverticulitis is caused by bacteria. Diverticulitis happens when stool becomes trapped in diverticula. This allows bacteria to grow in the diverticula, which can lead to inflammation and infection. What increases the risk? People with diverticulosis are at risk for diverticulitis. Eating a diet that does not include enough fiber from fruits and vegetables may make diverticulitis more likely to develop. What are the signs or symptoms? Symptoms of diverticulitis may include:  Abdominal pain and tenderness. The pain is normally located on the left side of the abdomen, but may occur in other areas.  Fever and chills.  Bloating.  Cramping.  Nausea.  Vomiting.  Constipation.  Diarrhea.  Blood in your stool.  How is this diagnosed? Your health care provider will ask you about your medical history and do a physical exam. You may need to have tests done because many medical conditions can cause the same symptoms as diverticulitis. Tests may include:  Blood tests.  Urine tests.  Imaging tests of the abdomen, including X-rays and CT scans.  When your condition is under control, your health care provider may recommend that you have a colonoscopy. A colonoscopy can show how severe your diverticula are and whether something else is causing your symptoms. How is this treated? Most cases of diverticulitis are mild and can be treated at home. Treatment may include:  Taking over-the-counter pain medicines.  Following a clear liquid  diet.  Taking antibiotic medicines by mouth for 7-10 days.  More severe cases may be treated at a hospital. Treatment may include:  Not eating or drinking.  Taking prescription pain medicine.  Receiving antibiotic medicines through an IV tube.  Receiving fluids and nutrition through an IV tube.  Surgery.  Follow these instructions at home:  Follow your health care providers instructions carefully.  Follow a full liquid diet or other diet as directed by your health care provider. After your symptoms improve, your health care provider may tell you to change your diet. He or she may recommend you eat a high-fiber diet. Fruits and vegetables are good sources of fiber. Fiber makes it easier to pass stool.  Take fiber supplements or probiotics as directed by your health care provider.  Only take medicines as directed by your health care provider.  Keep all your follow-up appointments. Contact a health care provider if:  Your pain does not improve.  You have a hard time eating food.  Your bowel movements do not return to normal. Get help right away if:  Your pain becomes worse.  Your symptoms do not get better.  Your symptoms suddenly get worse.  You have a fever.  You have repeated vomiting.  You have bloody or black, tarry stools. This information is not intended to replace advice given to you by your health care provider. Make sure you discuss any questions you have with your health care provider. Document Released: 06/25/2005 Document Revised: 02/21/2016 Document Reviewed: 08/10/2013 Elsevier Interactive Patient Education  2017 [employer] Inc[employer]   Colostomy, Adult A colostomy is a surgical procedure that involves attaching part of the colon to the front of the  abdomen (abdominal wall). The colon is the last part of the digestive tract. It is where water is absorbed from digested food to form stool (feces). A colostomy is done to redirect stool through an opening  (stoma) in the abdominal wall. You may need this surgery if you have a medical condition that prevents stool from leaving your body through the usual opening (rectum). A bag will be attached to the stoma on the outside of your body. This bag will collect the stool and waste that is redirected through the stoma. A colostomy may be temporary or permanent. Tell a health care provider about:  Any allergies you have.  All medicines you are taking, including vitamins, herbs, eye drops, creams, and over-the-counter medicines.  Any problems you or family members have had with anesthetic medicines.  Any blood disorders you have.  Any surgeries you have had.  Any medical conditions you have.  Whether you are pregnant or may be pregnant. What are the risks? Generally, this is a safe procedure. However, problems may occur, including:  Infection.  Bleeding.  Allergic reactions to medicines.  Damage to other structures or organs.  Leaking of stool inside the abdomen.  Formation of scar tissue that causes a blockage.  What happens before the procedure?  Follow instructions from your health care provider about eating or drinking restrictions.  Ask your health care provider about: ? Changing or stopping your regular medicines. This is especially important if you are taking diabetes medicines or blood thinners. ? Taking medicines such as aspirin and ibuprofen. These medicines can thin your blood. Do not take these medicines before your procedure if your health care provider instructs you not to.  Do not use any tobacco products, such as cigarettes, chewing tobacco, and e-cigarettes. If you need help quitting, ask your health care provider.  Plan to have someone take you home after the procedure.  You may have an exam or testing.  Ask your health care provider how your surgical site will be marked or identified.  You may be given antibiotic medicine to help prevent infection. What  happens during the procedure?  To reduce your risk of infection: ? Your health care team will wash or sanitize their hands. ? Your skin will be washed with soap.  An IV tube will be inserted into one of your veins.  A drainage tube may be passed through your nose and into your stomach (NG tube, or nasogastric tube).  You may be given a medicine to help you relax (sedative).  You will be given a medicine to make you fall asleep (general anesthetic).  An incision will be made in the front of your abdomen.  The muscles under the skin will be divided or separated.  The next steps will vary depending on the type of colostomy. There are two main types: ? End colostomy. Part of the colon will be removed, or the colon will be divided into two separate parts. The end of the colon that is attached to the upper part of the digestive tract will be attached to the wall of the abdomen, creating a stoma. The other end will be closed off. ? Loop colostomy. A part of the colon will be pulled through the incision in the abdomen. Two openings will be made in the side of the colon. The colon will be attached to the skin at these openings, creating the stoma.  Stitches (sutures) will be used to create the stoma and close the incision.  A  colostomy bag will be placed over the stoma to collect stool and mucus. The procedure may vary among health care providers and hospitals. What happens after the procedure?  Your blood pressure, heart rate, breathing rate, and blood oxygen level will be monitored often until the medicines you were given have worn off.  You will be given pain medicine as needed.  You will receive fluids and nutrition through an IV tube.  As soon as you are eating well and passing stool through the colostomy, the nasogastric tube and the IV tube may be removed.  Do not drive for 24 hours if you received a sedative. This information is not intended to replace advice given to you by your  health care provider. Make sure you discuss any questions you have with your health care provider. Document Released: 02/05/2011 Document Revised: 02/21/2016 Document Reviewed: 05/29/2015 Elsevier Interactive Patient Education  2018 [employer].

## 2017-07-14 NOTE — Care Management Important Message (Signed)
Important Message  Patient Details  Name: DAREK EIFLER MRN: [id number] Date of Birth: [birth year]   Medicare Important Message Given:  Yes    Beverly Sessions, RN 07/14/2017, 12:23 PM

## 2017-07-14 NOTE — Discharge Planning (Signed)
Patient PICC line removed per order.  Dressing changed (Clean, dry and intact).  Educated on colostomy and wound care. RN assessment and VS revealed stability for DC to home with [facility]. Informed of suggested FU appts and appts made. Signed pain script printed and given.  Patient in no pain.  Patient awaiting ride and plans to leave between 1-2pm. Awaiting ride. Once ride arrives, will be wheeled to front and family transporting home via car.

## 2017-07-14 NOTE — Care Management (Signed)
Patient to discharge home today.  RNCM notified Tim with [facility] of discharge.  RNCM requested MD to place orders for home health nursing.

## 2017-07-14 NOTE — Care Management (Signed)
Orders for home health were placed prior to discharge.

## 2017-07-15 ENCOUNTER — Telehealth: Payer: Self-pay | Admitting: Cardiology

## 2017-07-15 MED ORDER — IRBESARTAN 300 MG PO TABS: 150.0000 mg | ORAL_TABLET | Freq: Every day | ORAL | Status: DC

## 2017-07-15 NOTE — Telephone Encounter (Signed)
Pt of Dr. [name]  Returned call. Pt recently discharged from hospital following 2 week stay - had part of colon removed due to diverticulitis/perforated bowel.  He's been having some low BP readings since coming home from hospital, wife wanted advice on his medications.  Confirmed med list. He's currently taking irbesartan 300mg  daily.  She asked about spironolactone, unsure if he needs to remain on this. I advised no changes to this or to carvedilol.  I advised to cut irbesartan in half, and continue other meds as dosed, continue to follow & record BPs & HRs daily, and call if any new concerns.  Will seek Dr. Doug Sou recommendation for any further changes. Does he need sooner OV than scheduled (Dec 11th)?

## 2017-07-15 NOTE — Telephone Encounter (Signed)
Agree with reduction in irbesartan dose. If BP remains low let us know. Keep appointment in December.  Paddy Walthall [name] MD, [facility]

## 2017-07-15 NOTE — Telephone Encounter (Signed)
Instructions relayed to wife. Understanding verbalized, she's aware to call if continued concerns for BP or new issues.  They will keep Dec appt as advised. Med list updated w changes.

## 2017-07-15 NOTE — Telephone Encounter (Signed)
New message    Pt c/o BP issue: STAT if pt c/o blurred vision, one-sided weakness or slurred speech  1. What are your last 5 BP readings? 8pm-88/57 this morning-96/73   2. Are you having any other symptoms (ex. Dizziness, headache, blurred vision, passed out)? No not right now  3. What is your BP issue? Pt wife is concerned about pt BP because of his changes to his medication. Please call.

## 2017-07-16 DIAGNOSIS — Z433 Encounter for attention to colostomy: Secondary | ICD-10-CM | POA: Diagnosis not present

## 2017-07-16 DIAGNOSIS — I4891 Unspecified atrial fibrillation: Secondary | ICD-10-CM | POA: Diagnosis not present

## 2017-07-16 DIAGNOSIS — I2510 Atherosclerotic heart disease of native coronary artery without angina pectoris: Secondary | ICD-10-CM | POA: Diagnosis not present

## 2017-07-16 DIAGNOSIS — Z7901 Long term (current) use of anticoagulants: Secondary | ICD-10-CM | POA: Diagnosis not present

## 2017-07-16 DIAGNOSIS — Z9181 History of falling: Secondary | ICD-10-CM | POA: Diagnosis not present

## 2017-07-16 NOTE — Telephone Encounter (Signed)
FMLA paperwork was faxed with positive confirmation at this time and will be scanned in. Call was made to patient's wife. No answer. Left voicemail stating that paperwork has been filled out and faxed.

## 2017-07-18 DIAGNOSIS — Z9181 History of falling: Secondary | ICD-10-CM | POA: Diagnosis not present

## 2017-07-18 DIAGNOSIS — I2510 Atherosclerotic heart disease of native coronary artery without angina pectoris: Secondary | ICD-10-CM | POA: Diagnosis not present

## 2017-07-18 DIAGNOSIS — Z7901 Long term (current) use of anticoagulants: Secondary | ICD-10-CM | POA: Diagnosis not present

## 2017-07-18 DIAGNOSIS — I4891 Unspecified atrial fibrillation: Secondary | ICD-10-CM | POA: Diagnosis not present

## 2017-07-18 DIAGNOSIS — Z433 Encounter for attention to colostomy: Secondary | ICD-10-CM | POA: Diagnosis not present

## 2017-07-21 ENCOUNTER — Other Ambulatory Visit: Payer: Self-pay

## 2017-07-22 ENCOUNTER — Telehealth: Payer: Self-pay | Admitting: Surgery

## 2017-07-22 NOTE — Telephone Encounter (Signed)
Patient's wife has called [facility]) with concerns of the stoma. She states that when changing the ostomy flange she is having to shape it like a football because the opening is getting wider. She also states that she is concerned of the feces that is getting in the opening of the left side of the stoma.  She would like to know if this is something that needs to be seen for today.  Patient is scheduled to see Dr Burt Knack tomorrow (07/23/17) @ 2:00.  SX: exploratory laparotomy and sigmoid colon resection colostomy (Hartman's Procedure) on 07/03/17 with Dr Burt Knack.

## 2017-07-22 NOTE — Telephone Encounter (Signed)
Since home care nurses have been seeing patient, the stoma has been irregular shaped. It seems to be more oblong at this time and there is a hole between the stoma and the skin. She can see stitches at this site. She states that patient has no abdominal pain, Nausea/vomiting, or diarrhea. Bowel movements from ostomy site are soft and occur regularly throughout the day. Denies redness around site. She was just concerned because multiple home care nurses have commented on the way the stoma appears. I explained to the patient's wife that this sounds normal after surgery and does not sound concerning. She should keep the appointment tomorrow and bring a change for the ostomy so that we can examine the stoma site thoroughly.  Patient is currently using a Barrier ring, 2 piece system- [employer] with 2 3/4" ring.  Tiffany from [facility] is primary nurse and has ordered supplies last Thursday for ostomy.

## 2017-07-23 ENCOUNTER — Encounter: Payer: Self-pay | Admitting: Surgery

## 2017-07-23 ENCOUNTER — Ambulatory Visit (INDEPENDENT_AMBULATORY_CARE_PROVIDER_SITE_OTHER): Payer: PPO | Admitting: Surgery

## 2017-07-23 VITALS — BP 127/78 | HR 89 | Temp 97.4°F | Ht 69.0 in | Wt 224.2 lb

## 2017-07-23 DIAGNOSIS — K5730 Diverticulosis of large intestine without perforation or abscess without bleeding: Secondary | ICD-10-CM

## 2017-07-23 NOTE — Patient Instructions (Addendum)
Please keep a dressing over the incision area. We have removed some of the staples today.  Please see your follow up appointment listed below.

## 2017-07-23 NOTE — Progress Notes (Signed)
Outpatient postop visit  07/23/2017  Ryan Mccall is an 71 y.o. male.    Procedure: Hartmann's procedure  CC:  HPI: This patient status post Hartman's procedure for perforated diverticulitis.  He has a retracted colostomy and home health is asked questions concerning it.  This is a known problem that we had identified prior to his discharge.  And is secondary to his obesity.  He has no other complaints and is feeling well.  Ostomy is functional.  Medications reviewed.    Physical Exam:  There were no vitals taken for this visit.    PE: Pink ostomy slightly retracted especially medially.  There is functional and viable.  Abdomen is otherwise soft and nontender.  Midline wound has staples in place and there is some superficial dehiscence around 1 of the staples which is removed.  Half of the staples are removed as well.  No sign of frank wound infection and the opening is very superficial.    Assessment/Plan:  Retracted colostomy following Hartmann's procedure.  This was done for perforated diverticulitis.  Patient doing very well half the staples are removed.  I will see him back in 10 days to remove the remainder of the staples.  I reassured he and his wife concerning the ostomy.  We knew that this was a difficult colostomy showing signs of ischemia early on.  Those ischemic signs are gone and the ostomy is completely functional and viable but has retracted slightly.  The plan with this temporary colostomy is to close the colostomy as soon as it is safe. Florene Glen, MD, FACS

## 2017-07-28 DIAGNOSIS — Z7901 Long term (current) use of anticoagulants: Secondary | ICD-10-CM | POA: Diagnosis not present

## 2017-07-28 DIAGNOSIS — I2510 Atherosclerotic heart disease of native coronary artery without angina pectoris: Secondary | ICD-10-CM | POA: Diagnosis not present

## 2017-07-28 DIAGNOSIS — Z9181 History of falling: Secondary | ICD-10-CM | POA: Diagnosis not present

## 2017-07-28 DIAGNOSIS — Z433 Encounter for attention to colostomy: Secondary | ICD-10-CM | POA: Diagnosis not present

## 2017-07-28 DIAGNOSIS — I4891 Unspecified atrial fibrillation: Secondary | ICD-10-CM | POA: Diagnosis not present

## 2017-08-03 DIAGNOSIS — Z433 Encounter for attention to colostomy: Secondary | ICD-10-CM | POA: Diagnosis not present

## 2017-08-03 DIAGNOSIS — Z9181 History of falling: Secondary | ICD-10-CM | POA: Diagnosis not present

## 2017-08-03 DIAGNOSIS — I4891 Unspecified atrial fibrillation: Secondary | ICD-10-CM | POA: Diagnosis not present

## 2017-08-03 DIAGNOSIS — Z7901 Long term (current) use of anticoagulants: Secondary | ICD-10-CM | POA: Diagnosis not present

## 2017-08-03 DIAGNOSIS — I2510 Atherosclerotic heart disease of native coronary artery without angina pectoris: Secondary | ICD-10-CM | POA: Diagnosis not present

## 2017-08-06 ENCOUNTER — Ambulatory Visit (INDEPENDENT_AMBULATORY_CARE_PROVIDER_SITE_OTHER): Payer: PPO | Admitting: Surgery

## 2017-08-06 ENCOUNTER — Encounter: Payer: Self-pay | Admitting: Surgery

## 2017-08-06 VITALS — BP 148/90 | HR 81 | Temp 97.9°F | Wt 224.0 lb

## 2017-08-06 DIAGNOSIS — K5730 Diverticulosis of large intestine without perforation or abscess without bleeding: Secondary | ICD-10-CM

## 2017-08-06 NOTE — Progress Notes (Signed)
Patient status post Hartman's procedure for perforated diverticulitis.  He is feeling well gaining his strength back and eating well.  His ostomy is functional well  Wound is clean remainder of the staples removed no erythema no drainage no open areas.  Ostomy is pink.  Nontender abdomen.  Patient doing very well recommend follow-up in 4-6 weeks.

## 2017-08-06 NOTE — Patient Instructions (Signed)
Please give [place] a call in case you have any questions or concerns.  GENERAL POST-OPERATIVE PATIENT INSTRUCTIONS   WOUND CARE INSTRUCTIONS:  Keep a dry clean dressing on the wound if there is drainage. The initial bandage may be removed after 24 hours.  Once the wound has quit draining you may leave it open to air.  If clothing rubs against the wound or causes irritation and the wound is not draining you may cover it with a dry dressing during the daytime.  Try to keep the wound dry and avoid ointments on the wound unless directed to do so.  If the wound becomes bright red and painful or starts to drain infected material that is not clear, please contact your physician immediately.  If the wound is mildly pink and has a thick firm ridge underneath it, this is normal, and is referred to as a healing ridge.  This will resolve over the next 4-6 weeks.  BATHING: You may shower if you have been informed of this by your surgeon. However, Please do not submerge in a tub, hot tub, or pool until incisions are completely sealed or have been told by your surgeon that you may do so.  DIET:  You may eat any foods that you can tolerate.  It is a good idea to eat a high fiber diet and take in plenty of fluids to prevent constipation.  If you do become constipated you may want to take a mild laxative or take ducolax tablets on a daily basis until your bowel habits are regular.  Constipation can be very uncomfortable, along with straining, after recent surgery.  ACTIVITY:  You are encouraged to cough and deep breath or use your incentive spirometer if you were given one, every 15-30 minutes when awake.  This will help prevent respiratory complications and low grade fevers post-operatively if you had a general anesthetic.  You may want to hug a pillow when coughing and sneezing to add additional support to the surgical area, if you had abdominal or chest surgery, which will decrease pain during these times.  You are  encouraged to walk and engage in [place] activity for the next two weeks.  You should not lift more than 20 pounds, until 08/14/2017 as it could put you at increased risk for complications.  Twenty pounds is roughly equivalent to a plastic bag of groceries. At that time- Listen to your body when lifting, if you have pain when lifting, stop and then try again in a few days. Soreness after doing exercises or activities of daily living is normal as you get back in to your normal routine.  MEDICATIONS:  Try to take narcotic medications and anti-inflammatory medications, such as tylenol, ibuprofen, naprosyn, etc., with food.  This will minimize stomach upset from the medication.  Should you develop nausea and vomiting from the pain medication, or develop a rash, please discontinue the medication and contact your physician.  You should not drive, make important decisions, or operate machinery when taking narcotic pain medication.  SUNBLOCK Use sun block to incision area over the next year if this area will be exposed to sun. This helps decrease scarring and will allow you avoid a permanent darkened area over your incision.  QUESTIONS:  Please feel free to call our office if you have any questions, and we will be glad to assist you. [phone]

## 2017-08-10 DIAGNOSIS — I4891 Unspecified atrial fibrillation: Secondary | ICD-10-CM | POA: Diagnosis not present

## 2017-08-10 DIAGNOSIS — Z7901 Long term (current) use of anticoagulants: Secondary | ICD-10-CM | POA: Diagnosis not present

## 2017-08-10 DIAGNOSIS — Z9181 History of falling: Secondary | ICD-10-CM | POA: Diagnosis not present

## 2017-08-10 DIAGNOSIS — Z433 Encounter for attention to colostomy: Secondary | ICD-10-CM | POA: Diagnosis not present

## 2017-08-10 DIAGNOSIS — I2510 Atherosclerotic heart disease of native coronary artery without angina pectoris: Secondary | ICD-10-CM | POA: Diagnosis not present

## 2017-08-12 ENCOUNTER — Encounter: Payer: PPO | Admitting: Surgery

## 2017-08-17 DIAGNOSIS — Z433 Encounter for attention to colostomy: Secondary | ICD-10-CM | POA: Diagnosis not present

## 2017-08-17 DIAGNOSIS — Z9181 History of falling: Secondary | ICD-10-CM | POA: Diagnosis not present

## 2017-08-17 DIAGNOSIS — I4891 Unspecified atrial fibrillation: Secondary | ICD-10-CM | POA: Diagnosis not present

## 2017-08-17 DIAGNOSIS — I2510 Atherosclerotic heart disease of native coronary artery without angina pectoris: Secondary | ICD-10-CM | POA: Diagnosis not present

## 2017-08-17 DIAGNOSIS — Z7901 Long term (current) use of anticoagulants: Secondary | ICD-10-CM | POA: Diagnosis not present

## 2017-08-26 ENCOUNTER — Telehealth: Payer: Self-pay

## 2017-08-26 NOTE — Telephone Encounter (Signed)
Patient's disability form from [employer] and [employer] were filled out and faxed with the additional information they requested.

## 2017-09-01 ENCOUNTER — Telehealth: Payer: Self-pay

## 2017-09-01 NOTE — Telephone Encounter (Addendum)
Patient's wife call at this time and advised me that she does not know where and how to obtain more ostomy supplies for her husband. She stated that the home health nurse advised her to go through [employer] for supplies but when she called they advised her that they were out of network. She stated that she was told to try [employer] in [place]. I told her to try giving them a call and that I would try as well. She verbalized understanding.   Call made to [facility] and did not get an answer.    Patient's wife called back and advised that she did not get an answer. I told her that I would give [employer] a call since that is the brand of supplies that he uses. I also verbalized to her that I would reach out to [employer] as well for some sample supplies. I told her that I will call her back with any information that I get from [facility].

## 2017-09-01 NOTE — Telephone Encounter (Signed)
Call made to [facility] at this time I spoke with [facility]. I verbalized to him that the patient is in need of supplies and is unsure of who to contact due to insurance. He was able to pull patient's information up in his system. He verbalized to me that he will have a distrubution rep contact him to help find a supplier that will be in network for him. I verbalized understanding and thanked him for his time.    Call made to patient's wife at this time and told that I spoke with Hilliard Clark at [facility] and verbalized the issue to them. I told her that a [employer] distrubution rep will be contacting them today and if she does not hear from them by tomorrow morning to give our office a call so that I can follow-up with her. I also advised her that I have some samples her for her to pick up. Patient was grateful and verbalized understanding.

## 2017-09-08 ENCOUNTER — Ambulatory Visit: Payer: PPO | Admitting: Cardiology

## 2017-09-17 ENCOUNTER — Telehealth: Payer: Self-pay

## 2017-09-17 ENCOUNTER — Ambulatory Visit (INDEPENDENT_AMBULATORY_CARE_PROVIDER_SITE_OTHER): Payer: PPO | Admitting: Surgery

## 2017-09-17 ENCOUNTER — Encounter: Payer: Self-pay | Admitting: Surgery

## 2017-09-17 VITALS — BP 139/90 | HR 59 | Temp 97.9°F | Ht 69.0 in | Wt 225.0 lb

## 2017-09-17 DIAGNOSIS — K5730 Diverticulosis of large intestine without perforation or abscess without bleeding: Secondary | ICD-10-CM

## 2017-09-17 NOTE — Progress Notes (Signed)
Outpatient postop visit  09/17/2017  Ryan Mccall is an 71 y.o. male.    Procedure: Hartmann's procedure for perforated diverticulitis  CC: No problems  HPI: This patient underwent a Hartman's procedure for perforated diverticulitis back in October. Patient is eating well having good bowel movements through his colostomy.  He has no problems other than having problems with his insurance company getting reimbursement for his colostomy supplies.  He is gaining weight. Medications reviewed.    Physical Exam:  BP 139/90   Pulse (!) 59   Temp 97.9 F (36.6 C) (Oral)   Ht 5\' 9"  (1.753 m)   Wt 225 lb (102.1 kg)   BMI 33.23 kg/m     PE: Awake alert and oriented vital signs are stable no icterus no jaundice abdomen is soft and nontender ostomy is functional rectus diastases is noted    Assessment/Plan:  This patient had a Hartman's procedure with end colostomy for perforated diverticulitis in October.  Patient doing very well he wants to have this ostomy closed at some point.  Will see him at the end of January and scheduled for February to close the colostomy at that point.  He is checking on when his colon last colonoscopy was done.  He may need to have that repeated if it is not recent.  Florene Glen, MD, FACS

## 2017-09-17 NOTE — Patient Instructions (Addendum)
We will see you back in office as listed below:  Prior to this appointment if you would please find out the date of your last colonoscopy and contact us with that information that would be helpful with planning your surgery.   Also please contact your insurance company to be reimburse for out of pocket supplies that you have purchased.  If you have any questions or concerns please give our office a call.

## 2017-09-17 NOTE — Telephone Encounter (Signed)
Patient's wife called and stated that she contacted the [facility] office where patient was last seen for a colonoscopy. She stated that she was told by a nurse there that his last colonoscopy was 06/2012 with a 10 year follow up and that there were multiple diverticuloses found in the sigmoid colon at that time. She did verbalized that she will have them fax over this information to be placed in patients record. I thanked her and verbalized understanding at this time.

## 2017-10-21 ENCOUNTER — Ambulatory Visit (INDEPENDENT_AMBULATORY_CARE_PROVIDER_SITE_OTHER): Payer: PPO | Admitting: Surgery

## 2017-10-21 ENCOUNTER — Encounter: Payer: Self-pay | Admitting: Surgery

## 2017-10-21 VITALS — BP 135/82 | HR 70 | Temp 97.8°F | Ht 69.0 in | Wt 230.0 lb

## 2017-10-21 DIAGNOSIS — K5730 Diverticulosis of large intestine without perforation or abscess without bleeding: Secondary | ICD-10-CM

## 2017-10-21 MED ORDER — POLYETHYLENE GLYCOL 3350 17 GM/SCOOP PO POWD: 1.0000 | Freq: Once | ORAL | Status: DC

## 2017-10-21 MED ORDER — POLYETHYLENE GLYCOL 3350 17 GM/SCOOP PO POWD: 1.0000 | Freq: Once | ORAL | 0 refills | Status: AC

## 2017-10-21 MED ORDER — FLEET ENEMA 7-19 GM/118ML RE ENEM: 1.0000 | ENEMA | Freq: Once | RECTAL | 0 refills | Status: AC

## 2017-10-21 MED ORDER — BISACODYL EC 5 MG PO TBEC: DELAYED_RELEASE_TABLET | ORAL | 0 refills | Status: DC

## 2017-10-21 NOTE — Progress Notes (Signed)
Outpatient postop visit  10/21/2017  Ryan Mccall is an 72 y.o. male.    Procedure: Hartmann's procedure  CC: No problems  HPI: Patient is here to discuss closure.  Patient had a Hartman's procedure performed in October and has gained weight and feels well has no other new medical problems.  He has not had a colonoscopy in several years.  Medications reviewed.    Physical Exam:  There were no vitals taken for this visit.    PE: Soft nontender abdomen ostomy is functional Wounds clean no erythema no drainage nontender calves chest clear to auscultation.  Cardiac is regular rate and rhythm   Assessment/Plan:  This patient with a Hartman's procedure for perforation.  He wishes to have closure.  Patient is doing very well I agree with closure but he will need a colonoscopy via the colostomy to ensure that there are no synchronous lesions.  His primary disease was diverticulitis and he had had a colonoscopy several years ago.  The likelihood of him having another problem is low but not 0 and warrants investigation prior to closure.  We discussed the procedure we discussed the risk of bleeding infection anastomotic leak at the potential for a temporary or permanent ileostomy or colostomy.  He and his wife understood and agreed to proceed multiple questions were answered for them.  Florene Glen, MD, FACS

## 2017-10-21 NOTE — Patient Instructions (Signed)
We will send the request for you to have your Colonoscopy and to have it done on a Tuesday or Wednesday.  We have spoken today about reversing your Ostomy. You are requesting to have this done.  We will arrange this to be done on 11/17/2017 by Dr. Phoebe Perch at [facility].   Plan on being in the hospital between 5-7 days after surgery. You will be started on a liquid diet and then advanced as tolerated prior to going home.  If you have any disability or FMLA paperwork that needs to be filled out for your employer, please bring this in prior to surgery and it will be filled out upon your discharge from the hospital. We can give you a note anticipating your surgery date. If your employer is in need of this, please let us know.  To prep for your surgery, You will need to complete a bowel prep, 2 antibiotics, and a fleets enema prior to surgery. Your antibiotics are Neomycin and Erythromycin and these will be taken at 8am, 2pm, 8pm on the day of your prep. (Please see the Bowel sheet provided)  Please see your [facility]) Pre-Care Sheet for more information. If you have any questions, please call our office and ask for a nurse.  End Colostomy Reversal An end colostomy reversal is surgery that reverses an end colostomy. The large intestine is disconnected from the opening in the abdomen (stoma). Then it is reconnected to the large intestine inside the body. A stoma and pouch are no longer needed. Bowel movements can resume through the rectum. LET [facility] CARE PROVIDER KNOW ABOUT:  Allergies to food or medicine.  Medicines taken, including vitamins, health supplements, herbs, eye drops, over-the-counter medicines, and creams.  Use of steroids (by mouth or creams).  Previous problems with anesthetics or numbing medicines.  History of bleeding problems or blood clots.  Previous surgery.  Other health problems, including diabetes and kidney problems.  Possibility of pregnancy, if this  applies. RISKS AND COMPLICATIONS General surgical complications may include the following:  Reaction to anesthetics.  Damage to surrounding nerves, tissues, or structures.  Blood clot.  Bleeding.  Scarring. Specific risks for colostomy reversal, while rare, may include:  Intestinal paralysis (ileus). This is a normal part of recovery. It usually goes away in 3-7 days. However, it can last longer in some people.  Leaking at the joined part of the intestine (anastomotic leak).  Infection of the surgical cut (incision) or the place where the stoma was located.  A collection of pus (abscess) in the abdomen or pelvis.  Intestinal blockage.  Narrowing at the joined part of the intestine (stricture).  Urinary and sexual dysfunction. BEFORE THE PROCEDURE It is important to follow your health care provider's instructions prior to your procedure. This will help you to avoid complications. Steps before your procedure may include:  A physical exam, rectal exam, X-rays, colonoscopy, and other procedures.  Chemotherapy or radiation therapy, if the stoma was created due to cancer.  A review of the procedure, the anesthetic being used, and what to expect after the procedure. You may be asked to:  Stop taking certain medicines for several days prior to your procedure. These may include blood thinners (such as aspirin).  Take certain medicines, such as antibiotics or stool softeners.  Avoid eating and drinking after midnight the night before the procedure. This will help you to avoid complications from the anesthetic.  Quit smoking. Smoking increases the chances of a healing problem after your  procedure. PROCEDURE You will be given medicine that makes you sleep (general anesthetic). The procedure may be done as open surgery, with a large incision. It may also be done as laparoscopic surgery, with several smaller incisions. The surgeon will stitch or staple the intestine ends back  together. This surgery takes several hours. AFTER THE PROCEDURE  You will be given pain medicine.  Slowly increase your diet and movement as directed by your health care provider.  You should arrange for someone to help you with activities at home while you recover.   This information is not intended to replace advice given to you by your health care provider. Make sure you discuss any questions you have with your health care provider.   Document Released: 12/08/2011 Document Revised: 01/30/2015 Document Reviewed: 12/08/2011 Elsevier Interactive Patient Education [employer].

## 2017-10-22 ENCOUNTER — Telehealth: Payer: Self-pay

## 2017-10-22 ENCOUNTER — Other Ambulatory Visit: Payer: Self-pay

## 2017-10-22 ENCOUNTER — Telehealth: Payer: Self-pay | Admitting: General Practice

## 2017-10-22 DIAGNOSIS — Z433 Encounter for attention to colostomy: Secondary | ICD-10-CM

## 2017-10-22 NOTE — Telephone Encounter (Signed)
Patient with diagnosis of A Fib on Xarelto for anticoagulation.    Procedure: Colonoscopy Date of procedure: TBD  CHADS2-VASc score of  3 (HTN, AGE, CAD)  CrCl > 50 ml/min  Per office protocol, patient can hold Xarelto for 2 days prior to procedure.  Patient will not need bridging with Lovenox (enoxaparin) around procedure. He should restart Xarelto on the evening of procedure or day after, at discretion of procedure MD   Ryan Mccall PharmD, BCPS, [place] [address] [place] [address] 10/22/2017 4:55 PM

## 2017-10-22 NOTE — Telephone Encounter (Signed)
Patient called has questions about some fmla/disability paperwork, patient was also told payment would need to be made before filling out paperwork. Please call patient and advise.

## 2017-10-22 NOTE — Telephone Encounter (Signed)
Recommendations sent to Pre-op pool for call back

## 2017-10-22 NOTE — Telephone Encounter (Signed)
Payment was already made.

## 2017-10-22 NOTE — Telephone Encounter (Signed)
Pt is for colonoscopy 1/29 and we were contacted for pre op clearance. He hasn't been seen since June 2018. I'll need to talk with Dr [name] tomorrow when he's in the office.   Kerin Ransom PA-C 10/22/2017 5:03 PM

## 2017-10-22 NOTE — Telephone Encounter (Signed)
Received a call from Ginger with [facility].Patient is scheduled for a colonoscopy Tue 10/27/17.She wanted to know if ok for patient to hold Xarelto.Message sent to pharmacist for advice.

## 2017-10-23 NOTE — Telephone Encounter (Signed)
He is ok for colonoscopy from my standpoint. Hold anticoagulation per pharmacy  [name] MD, [facility]

## 2017-10-23 NOTE — Telephone Encounter (Signed)
Sent to Dr. Allen Norris via Epic; aslo faxed to [place] [phone] via Epic.

## 2017-10-23 NOTE — Telephone Encounter (Signed)
Pt has been notified to stop Xarelto 2 days prior to colonoscopy that is scheduled for Tuesday, 10/27/17.

## 2017-10-26 ENCOUNTER — Telehealth: Payer: Self-pay | Admitting: Surgery

## 2017-10-26 NOTE — Telephone Encounter (Signed)
Opened in error

## 2017-10-26 NOTE — Telephone Encounter (Signed)
Pt advised of pre op date/time and sx date. Sx: 11/17/17 with Dr Cooper--colostomy closure.  Pre op: 11/09/17 @ 9:00am-office interview.   Patient made aware to call [phone], between 1-3:00pm the day before surgery, to find out what time to arrive.

## 2017-10-27 ENCOUNTER — Ambulatory Visit: Payer: PPO | Admitting: Anesthesiology

## 2017-10-27 ENCOUNTER — Ambulatory Visit
Admission: RE | Admit: 2017-10-27 | Discharge: 2017-10-27 | Disposition: A | Discharge status: Home / Self Care | Payer: PPO | Source: Ambulatory Visit | Attending: Gastroenterology | Admitting: Gastroenterology

## 2017-10-27 ENCOUNTER — Encounter: Payer: Self-pay | Admitting: Anesthesiology

## 2017-10-27 ENCOUNTER — Encounter
Admission: RE | Disposition: A | Discharge status: Home / Self Care | Payer: Self-pay | Source: Ambulatory Visit | Attending: Gastroenterology

## 2017-10-27 DIAGNOSIS — K5790 Diverticulosis of intestine, part unspecified, without perforation or abscess without bleeding: Secondary | ICD-10-CM | POA: Diagnosis not present

## 2017-10-27 DIAGNOSIS — K5732 Diverticulitis of large intestine without perforation or abscess without bleeding: Secondary | ICD-10-CM

## 2017-10-27 DIAGNOSIS — I2510 Atherosclerotic heart disease of native coronary artery without angina pectoris: Secondary | ICD-10-CM | POA: Insufficient documentation

## 2017-10-27 DIAGNOSIS — Z7901 Long term (current) use of anticoagulants: Secondary | ICD-10-CM | POA: Insufficient documentation

## 2017-10-27 DIAGNOSIS — E7800 Pure hypercholesterolemia, unspecified: Secondary | ICD-10-CM | POA: Diagnosis not present

## 2017-10-27 DIAGNOSIS — I4891 Unspecified atrial fibrillation: Secondary | ICD-10-CM | POA: Diagnosis not present

## 2017-10-27 DIAGNOSIS — Z433 Encounter for attention to colostomy: Secondary | ICD-10-CM

## 2017-10-27 DIAGNOSIS — I10 Essential (primary) hypertension: Secondary | ICD-10-CM | POA: Insufficient documentation

## 2017-10-27 DIAGNOSIS — Z933 Colostomy status: Secondary | ICD-10-CM | POA: Diagnosis not present

## 2017-10-27 DIAGNOSIS — Z01818 Encounter for other preprocedural examination: Secondary | ICD-10-CM

## 2017-10-27 DIAGNOSIS — Z79899 Other long term (current) drug therapy: Secondary | ICD-10-CM | POA: Insufficient documentation

## 2017-10-27 DIAGNOSIS — Z6830 Body mass index (BMI) 30.0-30.9, adult: Secondary | ICD-10-CM | POA: Diagnosis not present

## 2017-10-27 DIAGNOSIS — E669 Obesity, unspecified: Secondary | ICD-10-CM | POA: Diagnosis not present

## 2017-10-27 HISTORY — PX: COLONOSCOPY WITH PROPOFOL: SHX5780

## 2017-10-27 SURGERY — COLONOSCOPY WITH PROPOFOL
Anesthesia: General

## 2017-10-27 MED ORDER — PROPOFOL 500 MG/50ML IV EMUL
INTRAVENOUS | Status: AC
  Filled 2017-10-27: qty 50

## 2017-10-27 MED ORDER — LIDOCAINE HCL (PF) 2 % IJ SOLN
INTRAMUSCULAR | Status: AC
  Filled 2017-10-27: qty 10

## 2017-10-27 MED ORDER — PROPOFOL 500 MG/50ML IV EMUL
INTRAVENOUS | Status: DC | PRN
  Administered 2017-10-27: 175 ug/kg/min via INTRAVENOUS

## 2017-10-27 MED ORDER — SODIUM CHLORIDE 0.9 % IV SOLN
INTRAVENOUS | Status: DC
  Administered 2017-10-27: 1000 mL via INTRAVENOUS

## 2017-10-27 MED ORDER — LIDOCAINE HCL (CARDIAC) 20 MG/ML IV SOLN
INTRAVENOUS | Status: DC | PRN
  Administered 2017-10-27: 50 mg via INTRAVENOUS

## 2017-10-27 MED ORDER — PROPOFOL 10 MG/ML IV BOLUS
INTRAVENOUS | Status: DC | PRN
  Administered 2017-10-27: 50 mg via INTRAVENOUS

## 2017-10-27 NOTE — Progress Notes (Signed)
Colonoscopy preformed through colostomy and rectum.

## 2017-10-27 NOTE — H&P (Signed)
Lucilla Lame, MD [facility] [address], [place], [place] [address] [phone] Fax : [phone]  Primary Care Physician:  Lillard Anes, MD Primary Gastroenterologist:  Dr. Allen Norris  Pre-Procedure History & Physical: HPI:  Ryan Mccall is a 72 y.o. male is here for an colonoscopy.   Past Medical History:  Diagnosis Date  . Atrial fibrillation ([place])   . Coronary artery disease    s/p remote CABG 1999 per Dr. Cyndia Bent;  Lexiscan Myoview (01/2014):  No ischemia, not gated, normal study  . Hypercholesterolemia   . Hypertension   . 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 COLECTOMY WITH COLOSTOMY CREATION/HARTMANN PROCEDURE N/A 07/03/2017   Procedure: COLECTOMY WITH COLOSTOMY CREATION/HARTMANN PROCEDURE;  Surgeon: Florene Glen, MD;  Location: [facility] ORS;  Service: General;  Laterality: N/A;  . CORONARY ARTERY BYPASS GRAFT  1999  . excision of skin cancer    . KNEE ARTHROSCOPY W/ MENISCAL REPAIR      Prior to Admission medications   Medication Sig Start Date End Date Taking? Authorizing Provider  carvedilol (COREG) 12.5 MG tablet Take 1.5 tablets by mouth twice a day 05/25/17  Yes Supple, Megan E, RPH  rivaroxaban (XARELTO) 20 MG TABS tablet Take 1 tablet (20 mg total) by mouth daily with supper. 06/13/15  Yes [name], [name] M, MD  BISACODYL 5 MG EC tablet Take 4 tablets at 8AM the day before your surgery. 10/21/17   Florene Glen, MD  COD LIVER OIL PO Take 1 capsule by mouth daily.     [provider]  ezetimibe (ZETIA) 10 MG tablet Take 10 mg by mouth daily.    [provider]  fexofenadine (ALLEGRA) 180 MG tablet Take 180 mg by mouth daily.      [provider]  fish oil-omega-3 fatty acids 1000 MG capsule Take 1 g by mouth daily.     [provider]  Multiple Vitamin (MULTIVITAMIN) tablet Take 1 tablet by mouth every other day.      [provider]  nitroGLYCERIN (NITROSTAT) 0.4 MG SL tablet Place 0.4 mg under the tongue as needed for chest pain. May repeat in 15 minutes up to 3 times for chest pain. 06/24/17   [provider]  simvastatin (ZOCOR) 10 MG tablet Take 10 mg by mouth at bedtime.    [provider]  spironolactone (ALDACTONE) 25 MG tablet Take 0.5 tablets (12.5 mg total) by mouth daily. 06/22/17   [name], [name] M, MD    Allergies as of 10/22/2017 - Review Complete 10/21/2017  Allergen Reaction Noted  . Shellfish allergy  09/03/2011    Family History  Problem Relation Age of Onset  . Lung disease Father 16    Social History   Socioeconomic History  . Marital status: Married    Spouse name: Not on file  . Number of children: 2  . Years of education: Not on file  . Highest education level: Not on file  Social Needs  . Financial resource strain: Not on file  . Food insecurity - worry: Not on file  . Food insecurity - inability: Not on file  . Transportation needs - medical: Not on file  . Transportation needs - non-medical: Not on file  Occupational History  . Occupation: Print production planner  Tobacco Use  . Smoking status: Never Smoker  . Smokeless tobacco: Former Systems developer    Types:  Chew  Substance and Sexual Activity  . Alcohol use: No    Alcohol/week: 0.0 oz  . Drug use: No  . Sexual activity: Yes  Other Topics Concern  . Not on file  Social History Narrative  . Not on file    Review of Systems: See HPI, otherwise negative ROS  Physical Exam: BP 137/77   Pulse 66   Temp (!) 97.1 F (36.2 C) (Tympanic)   Resp 20   Ht 5\' 9"  (1.753 m)   Wt 230 lb (104.3 kg)   SpO2 98%   BMI 33.97 kg/m  General:   Alert,  pleasant and cooperative in NAD Head:  Normocephalic and atraumatic. Neck:  Supple; no masses or thyromegaly. Lungs:  Clear throughout to auscultation.    Heart:  Regular rate and rhythm. Abdomen:  Soft, nontender and nondistended. Normal bowel sounds, without  guarding, and without rebound.   Neurologic:  Alert and  oriented x4;  grossly normal neurologically.  Impression/Plan: Ryan Mccall is here for an colonoscopy to be performed for History of diverticulitis with reversal of colostomy planned.  Risks, benefits, limitations, and alternatives regarding  colonoscopy have been reviewed with the patient.  Questions have been answered.  All parties agreeable.   Lucilla Lame, MD  10/27/2017, 11:35 AM

## 2017-10-27 NOTE — Anesthesia Procedure Notes (Signed)
Date/Time: 10/27/2017 11:47 AM Performed by: Johnna Acosta, CRNA Pre-anesthesia Checklist: Patient identified, Emergency Drugs available, Suction available, Patient being monitored and Timeout performed Patient Re-evaluated:Patient Re-evaluated prior to induction Oxygen Delivery Method: Nasal cannula Preoxygenation: Pre-oxygenation with 100% oxygen

## 2017-10-27 NOTE — Anesthesia Postprocedure Evaluation (Signed)
Anesthesia Post Note  Patient: Ryan Mccall  Procedure(s) Performed: COLONOSCOPY WITH PROPOFOL through colostomy (N/A )  Patient location during evaluation: PACU Anesthesia Type: General Level of consciousness: awake and alert and oriented Pain management: pain level controlled Vital Signs Assessment: post-procedure vital signs reviewed and stable Respiratory status: spontaneous breathing Cardiovascular status: blood pressure returned to baseline Anesthetic complications: no     Last Vitals:  Vitals:   10/27/17 1230 10/27/17 1238  BP:  117/78  Pulse: 68 60  Resp: 13 13  Temp:    SpO2: 97% 100%    Last Pain:  Vitals:   10/27/17 1214  TempSrc: Tympanic                 [name]

## 2017-10-27 NOTE — Op Note (Signed)
[facility] Patient Name: Ryan Mccall Procedure Date: 10/27/2017 11:41 AM MRN: [id number] Account #: [id number] Date of Birth: Oct 13, 1945 Admit Type: Outpatient Age: 72 Room: [facility] ENDO ROOM 4 Gender: Male Note Status: Finalized Procedure:            Colonoscopy Indications:          Preoperative assessment, Diverticulitis Providers:            Lucilla Lame MD, MD Referring MD:         Lillard Anes (Referring MD) Medicines:            Propofol per Anesthesia Complications:        No immediate complications. Procedure:            Pre-Anesthesia Assessment:                       - Prior to the procedure, a History and Physical was                        performed, and patient medications and allergies were                        reviewed. The patient's tolerance of previous                        anesthesia was also reviewed. The risks and benefits of                        the procedure and the sedation options and risks were                        discussed with the patient. All questions were                        answered, and informed consent was obtained. Prior                        Anticoagulants: The patient has taken no previous                        anticoagulant or antiplatelet agents. ASA Grade                        Assessment: II - A patient with mild systemic disease.                        After reviewing the risks and benefits, the patient was                        deemed in satisfactory condition to undergo the                        procedure.                       After obtaining informed consent, the colonoscope was                        passed under direct vision. Throughout the procedure,  the patient's blood pressure, pulse, and oxygen                        saturations were monitored continuously. The                        Colonoscope was introduced through the descending        colostomy and advanced to the the cecum, identified by                        appendiceal orifice and ileocecal valve. The                        colonoscopy was performed without difficulty. The                        patient tolerated the procedure well. The quality of                        the bowel preparation was excellent. The Colonoscope                        was introduced through the anus and advanced to the the                        sigmoid colon to examine an anastomosis. This was the                        intended extent. Findings:      The perianal and digital rectal examinations were normal.      Multiple small-mouthed diverticula were found in the sigmoid colon and       descending colon. Impression:           - Diverticulosis in the sigmoid colon and in the                        descending colon.                       - No specimens collected. Recommendation:       - Discharge patient to home.                       - Resume previous diet.                       - Continue present medications. Procedure Code(s):    --- Professional ---                       [id number], 53, Colonoscopy through stoma; diagnostic,                        including collection of specimen(s) by brushing or                        washing, when performed (separate procedure) Diagnosis Code(s):    --- Professional ---                       U04.540, Encounter for other preprocedural examination  K57.32, Diverticulitis of large intestine without                        perforation or abscess without bleeding CPT copyright 2016 American Medical Association. All rights reserved. The codes documented in this report are preliminary and upon coder review may  be revised to meet current compliance requirements. Lucilla Lame MD, MD 10/27/2017 12:11:06 PM This report has been signed electronically. Number of Addenda: 0 Note Initiated On: 10/27/2017 11:41 AM Scope Withdrawal Time: 0  hours 8 minutes 7 seconds  Total Procedure Duration: 0 hours 8 minutes 55 seconds       [facility]

## 2017-10-27 NOTE — Anesthesia Post-op Follow-up Note (Signed)
Anesthesia QCDR form completed.        

## 2017-10-27 NOTE — Anesthesia Preprocedure Evaluation (Addendum)
Anesthesia Evaluation  Patient identified by MRN, date of birth, ID band Patient awake    Reviewed: Allergy & Precautions, NPO status , Patient's Chart, lab work & pertinent test results, reviewed documented beta blocker date and time   Airway Mallampati: II  TM Distance: >3 FB     Dental  (+) Upper Dentures   Pulmonary neg pulmonary ROS,    Pulmonary exam normal        Cardiovascular hypertension, Pt. on medications and Pt. on home beta blockers + CAD  Normal cardiovascular exam+ dysrhythmias Atrial Fibrillation      Neuro/Psych negative psych ROS   GI/Hepatic negative GI ROS, Neg liver ROS,   Endo/Other  negative endocrine ROS  Renal/GU negative Renal ROS  negative genitourinary   Musculoskeletal negative musculoskeletal ROS (+)   Abdominal Normal abdominal exam  (+)   Peds negative pediatric ROS (+)  Hematology negative hematology ROS (+)   Anesthesia Other Findings   Reproductive/Obstetrics                            Anesthesia Physical Anesthesia Plan  ASA: III  Anesthesia Plan: General   Post-op Pain Management:    Induction: Intravenous  PONV Risk Score and Plan:   Airway Management Planned: Nasal Cannula  Additional Equipment:   Intra-op Plan:   Post-operative Plan:   Informed Consent: I have reviewed the patients History and Physical, chart, labs and discussed the procedure including the risks, benefits and alternatives for the proposed anesthesia with the patient or authorized representative who has indicated his/her understanding and acceptance.   Dental advisory given  Plan Discussed with: CRNA and Surgeon  Anesthesia Plan Comments:         Anesthesia Quick Evaluation

## 2017-10-27 NOTE — Transfer of Care (Signed)
Immediate Anesthesia Transfer of Care Note  Patient: Ryan Mccall  Procedure(s) Performed: COLONOSCOPY WITH PROPOFOL through colostomy (N/A )  Patient Location: PACU  Anesthesia Type:General  Level of Consciousness: awake  Airway & Oxygen Therapy: Patient Spontanous Breathing and Patient connected to nasal cannula oxygen  Post-op Assessment: Report given to RN and Post -op Vital signs reviewed and stable  Post vital signs: Reviewed and stable  Last Vitals:  Vitals:   10/27/17 1212 10/27/17 1213  BP:  95/68  Pulse:  79  Resp:  16  Temp: (!) 36.1 C   SpO2:  93%    Last Pain:  Vitals:   10/27/17 1212  TempSrc: Tympanic         Complications: No apparent anesthesia complications

## 2017-10-28 ENCOUNTER — Encounter: Payer: Self-pay | Admitting: Gastroenterology

## 2017-10-28 DIAGNOSIS — I2510 Atherosclerotic heart disease of native coronary artery without angina pectoris: Secondary | ICD-10-CM | POA: Diagnosis not present

## 2017-10-28 DIAGNOSIS — K5700 Diverticulitis of small intestine with perforation and abscess without bleeding: Secondary | ICD-10-CM | POA: Diagnosis not present

## 2017-10-28 DIAGNOSIS — R7303 Prediabetes: Secondary | ICD-10-CM | POA: Diagnosis not present

## 2017-10-28 DIAGNOSIS — I4891 Unspecified atrial fibrillation: Secondary | ICD-10-CM | POA: Diagnosis not present

## 2017-10-28 DIAGNOSIS — E782 Mixed hyperlipidemia: Secondary | ICD-10-CM | POA: Diagnosis not present

## 2017-10-28 DIAGNOSIS — I10 Essential (primary) hypertension: Secondary | ICD-10-CM | POA: Diagnosis not present

## 2017-10-30 ENCOUNTER — Telehealth: Payer: Self-pay | Admitting: Surgery

## 2017-10-30 NOTE — Telephone Encounter (Signed)
Patient's calling has questions about his disability paperwork and is asking could someone give him a call, said he needed to turn in some paperwork before surgery.please call patient and advise.

## 2017-10-30 NOTE — Telephone Encounter (Signed)
Called patient and he wanted to know if we had received his disability paperwork. I told him that we have not. He then gave me a phone number to contact his case worker. Phone# [phone] Mingo Amber  Case# [id number]  I told him that I would contact her to her her know that we have not received it and that I would take care of it once I do. Patient understood.  I called his case worker and gave her the fax number again and she stated that she would fax the paperwork that needs to be filled out.

## 2017-11-02 NOTE — Telephone Encounter (Signed)
Patient's disability disability forms were filled out and faxed to [place]. Patient was notified.

## 2017-11-09 ENCOUNTER — Encounter
Admission: RE | Admit: 2017-11-09 | Discharge: 2017-11-09 | Disposition: A | Discharge status: Home / Self Care | Payer: PPO | Source: Ambulatory Visit | Attending: Surgery | Admitting: Surgery

## 2017-11-09 ENCOUNTER — Other Ambulatory Visit: Payer: Self-pay

## 2017-11-09 DIAGNOSIS — E669 Obesity, unspecified: Secondary | ICD-10-CM | POA: Insufficient documentation

## 2017-11-09 DIAGNOSIS — I2510 Atherosclerotic heart disease of native coronary artery without angina pectoris: Secondary | ICD-10-CM | POA: Diagnosis not present

## 2017-11-09 DIAGNOSIS — Z01810 Encounter for preprocedural cardiovascular examination: Secondary | ICD-10-CM | POA: Diagnosis not present

## 2017-11-09 DIAGNOSIS — K5720 Diverticulitis of large intestine with perforation and abscess without bleeding: Secondary | ICD-10-CM | POA: Diagnosis not present

## 2017-11-09 DIAGNOSIS — I4891 Unspecified atrial fibrillation: Secondary | ICD-10-CM | POA: Diagnosis not present

## 2017-11-09 DIAGNOSIS — I10 Essential (primary) hypertension: Secondary | ICD-10-CM | POA: Insufficient documentation

## 2017-11-09 DIAGNOSIS — E7800 Pure hypercholesterolemia, unspecified: Secondary | ICD-10-CM | POA: Insufficient documentation

## 2017-11-09 DIAGNOSIS — Z01812 Encounter for preprocedural laboratory examination: Secondary | ICD-10-CM | POA: Diagnosis not present

## 2017-11-09 DIAGNOSIS — Z6830 Body mass index (BMI) 30.0-30.9, adult: Secondary | ICD-10-CM | POA: Insufficient documentation

## 2017-11-09 NOTE — Patient Instructions (Signed)
Your procedure is scheduled on: Tuesday 11/17/17 Report to [place]. To find out your arrival time please call [phone] between 1PM - 3PM on Monday 11/16/17.  Remember: Instructions that are not followed completely may result in serious medical risk, up to and including death, or upon the discretion of your surgeon and anesthesiologist your surgery may need to be rescheduled.     _X__ 1. Do not eat food after midnight the night before your procedure.                 No gum chewing or hard candies. You may drink clear liquids up to 2 hours                 before you are scheduled to arrive for your surgery- DO not drink clear                 liquids within 2 hours of the start of your surgery.                 Clear Liquids include:  water, apple juice without pulp, clear carbohydrate                 drink such as Clearfast of Gartorade, Black Coffee or Tea (Do not add                 anything to coffee or tea).  __X__2.  On the morning of surgery brush your teeth with toothpaste and water, you                 may rinse your mouth with mouthwash if you wish.  Do not swallow any              toothpaste of mouthwash.     _X__ 3.  No Alcohol for 24 hours before or after surgery.   _X__ 4.  Do Not Smoke or use e-cigarettes For 24 Hours Prior to Your Surgery.                 Do not use any chewable tobacco products for at least 6 hours prior to                 surgery.  ____  5.  Bring all medications with you on the day of surgery if instructed.   __X__  6.  Notify your doctor if there is any change in your medical condition      (cold, fever, infections).     Do not wear jewelry, make-up, hairpins, clips or nail polish. Do not wear lotions, powders, or perfumes. You may wear deodorant. Do not shave 48 hours prior to surgery. Men may shave face and neck. Do not bring valuables to the hospital.    [facility] is not responsible for  any belongings or valuables.  Contacts, dentures or bridgework may not be worn into surgery. Leave your suitcase in the car. After surgery it may be brought to your room. For patients admitted to the hospital, discharge time is determined by your treatment team.   Patients discharged the day of surgery will not be allowed to drive home.   Please read over the following fact sheets that you were given:   MRSA Information   __X__ Take these medicines the morning of surgery with A SIP OF WATER:    1. CARVEDILOL  2.   3.   4.  5.  6.  ____ Fleet Enema (as directed)   __X__ Use CHG Soap as directed  ____ Use inhalers on the day of surgery  ____ Stop metformin/Janumet/Farxiga 2 days prior to surgery    ____ Take 1/2 of usual insulin dose the night before surgery. No insulin the morning          of surgery.   __X__ Stop Blood Thinners Coumadin/Plavix/Xarelto/Pleta/Pradaxa/Eliquis/Effient/Aspirin  on AS PREVIOUSLY INSTRUCTED  __X__ Stop Anti-inflammatories such as Advil, Ibuprofen, Motrin, BC or Goodies  Powder, Naprosyn, Naproxen, Aleve  TODAY   __X__ Stop herbal supplements, fish oil or vitamin E until after surgery.  TODAY  ____ Bring C-Pap to the hospital.

## 2017-11-09 NOTE — Pre-Procedure Instructions (Signed)
A-fib noted on EKG from 01/18/2017.

## 2017-11-09 NOTE — Progress Notes (Signed)
[name] Date of Birth: 18-Feb-1946 Medical Record #[id number]  History of Present Illness: Mr. Ryan Mccall is seen back today for follow up of CAD. He has known CAD, HTN and HLD. He had remote CABG per Dr. Cyndia Bent in 1999 with LIMA to LAD, SVG to 1st DX and SVG to intermediate and OM of the LCX. He did have transient atrial fibrillation during a stress test in 2009. His last stress Myoview in July 2017showed normal perfusion. He was diagnosed with persistent atrial fibrillation in July 2014. Treated with rate control and anticoagulation.  Echocardiogram showed normal LV function with mild mitral insufficiency and moderate left atrial enlargement.  On his last visit last summer BP was poorly controlled. He was referred to HTN clinic and BP stabilized on irbesartan, sprionolactone, and Coreg.   He was admitted in October 2018 with perforated sigmoid colon with diverticulitis. He underwent surgery with placement of a Hartman's pouch. Colonoscopy in January 2019 showed diverticuli. Otherwise benign.   On follow up today he is doing well. He is scheduled to have reversal of his Hartman's pouch next week. He has not been on irbesartan since his last surgery due to hypotension. He brings extensive BP and HR recordings which have been normal. Notes his HR was elevated last week at doctor's appointment but this was unusual. He denies any chest pain, dyspnea, palpitations, no edema.  Current Outpatient Medications on File Prior to Visit  Medication Sig Dispense Refill  . BISACODYL 5 MG EC tablet Take 4 tablets at 8AM the day before your surgery. 4 tablet 0  . carvedilol (COREG) 12.5 MG tablet Take 1.5 tablets by mouth twice a day 270 tablet 3  . COD LIVER OIL PO Take 1 capsule by mouth 3 (three) times a week.     . diclofenac sodium (VOLTAREN) 1 % GEL Apply 1 application topically daily as needed (joint pain).    Marland Kitchen ezetimibe (ZETIA) 10 MG tablet Take 10 mg by mouth at bedtime.     . fexofenadine (ALLEGRA)  180 MG tablet Take 180 mg by mouth daily as needed for allergies.     . fish oil-omega-3 fatty acids 1000 MG capsule Take 1 g by mouth 3 (three) times a week.     . naproxen sodium (ALEVE) 220 MG tablet Take 220 mg by mouth daily as needed (pain).    . nitroGLYCERIN (NITROSTAT) 0.4 MG SL tablet Place 0.4 mg under the tongue as needed for chest pain. May repeat in 15 minutes up to 3 times for chest pain.  2  . rivaroxaban (XARELTO) 20 MG TABS tablet Take 1 tablet (20 mg total) by mouth daily with supper. 90 tablet 3  . simvastatin (ZOCOR) 10 MG tablet Take 10 mg by mouth at bedtime.    Marland Kitchen spironolactone (ALDACTONE) 25 MG tablet Take 0.5 tablets (12.5 mg total) by mouth daily. 45 tablet 3  . UNABLE TO FIND 1 tablet as needed. Med Name: cardio-plus     No current facility-administered medications on file prior to visit.     Allergies  Allergen Reactions  . Shellfish Allergy Nausea And Vomiting    Past Medical History:  Diagnosis Date  . Atrial fibrillation ([place])   . Coronary artery disease    s/p remote CABG 1999 per Dr. Cyndia Bent;  Lexiscan Myoview (01/2014):  No ischemia, not gated, normal study  . Hypercholesterolemia   . Hypertension   . 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 COLECTOMY WITH COLOSTOMY CREATION/HARTMANN PROCEDURE N/A 07/03/2017   Procedure: COLECTOMY WITH COLOSTOMY CREATION/HARTMANN PROCEDURE;  Surgeon: Florene Glen, MD;  Location: [facility] ORS;  Service: General;  Laterality: N/A;  . COLONOSCOPY WITH PROPOFOL N/A 10/27/2017   Procedure: COLONOSCOPY WITH PROPOFOL through colostomy;  Surgeon: Lucilla Lame, MD;  Location: [facility] ENDOSCOPY;  Service: Endoscopy;  Laterality: N/A;  . CORONARY ARTERY BYPASS GRAFT  1999  . excision of skin cancer    . KNEE ARTHROSCOPY W/ MENISCAL REPAIR      Social History   Tobacco Use  Smoking Status Never Smoker  Smokeless Tobacco Former Systems developer  . Types: Chew     Social History   Substance and Sexual Activity  Alcohol Use No  . Alcohol/week: 0.0 oz    Family History  Problem Relation Age of Onset  . Lung disease Father 7    Review of Systems: The review of systems is per the HPI.  All other systems were reviewed and are negative.  Physical Exam: BP 122/82   Pulse (!) 58   Ht 5\' 9"  (1.753 m)   Wt 227 lb (103 kg)   BMI 33.52 kg/m   GENERAL:  Well appearing WM obese HEENT:  PERRL, EOMI, sclera are clear. Oropharynx is clear. NECK:  No jugular venous distention, carotid upstroke brisk and symmetric, no bruits, no thyromegaly or adenopathy LUNGS:  Clear to auscultation bilaterally CHEST:  Unremarkable HEART:  IRRR,  PMI not displaced or sustained,S1 and S2 within normal limits, no S3, no S4: no clicks, no rubs, no murmurs ABD:  Soft, nontender. BS +, no masses or bruits. No hepatomegaly, no splenomegaly. Hartman's pouch in place. EXT:  2 + pulses throughout, no edema, no cyanosis no clubbing SKIN:  Warm and dry.  No rashes NEURO:  Alert and oriented x 3. Cranial nerves II through XII intact. PSYCH:  Cognitively intact     LABORATORY DATA:   Lab Results  Component Value Date   WBC 10.9 (H) 07/13/2017   HGB 12.2 (L) 07/13/2017   HCT 35.7 (L) 07/13/2017   PLT 243 07/13/2017   GLUCOSE 129 (H) 07/13/2017   CHOL 125 01/23/2014   TRIG 64 07/13/2017   HDL 37.10 (L) 01/23/2014   LDLCALC 74 01/23/2014   ALT 34 07/13/2017   AST 31 07/13/2017   NA 138 07/13/2017   K 4.4 07/13/2017   CL 109 07/13/2017   CREATININE 0.78 07/13/2017   BUN 20 07/13/2017   CO2 25 07/13/2017   TSH 3.48 04/08/2013   INR 1.99 07/01/2017   Labs from 02/18/17:  CBC with platelet count 130K. Marland Kitchen Cholesterol 127, triglycerides 79, LDL 73, HDL 38. A1c 5.6%. CMET normal. Dated 10/28/17: cholesterol 126, triglycerides 91, HDL 38, LDL 66. A1c 5.3%. CBC and CMET normal.   Myoview 04/11/16: Study Highlights    Nuclear stress EF: 54%. The LV systolic  function is normal  There was no ST segment deviation noted during stress.  The study is normal. no ischemia. No infarction  This is a low risk study.     ds  Assessment / Plan: 1. CAD - remote CABG in 1999 - normal Myoview in July 2017. Patient has class 1 symptoms- stable. Continue risk factor modification. Continue Coreg.    2. Atrial fibrillation - permanent. Rate control is excellent on Coreg. Patient is minimally symptomatic. Continue anticoagulation with Xarelto.  3. HTN -  Well  Controlled on Coreg and spironolactone.  4. HLD - on statin. Well controlled.  5. Obesity.encouraged weight loss   6. Sigmoid diverticulitis with rupture. S/p Hartman's pouch. Clear for revision next week from a cardiac standpoint.   Plan I will followup again in 6 months.

## 2017-11-13 ENCOUNTER — Encounter: Payer: Self-pay | Admitting: Cardiology

## 2017-11-13 ENCOUNTER — Ambulatory Visit (INDEPENDENT_AMBULATORY_CARE_PROVIDER_SITE_OTHER): Payer: PPO | Admitting: Cardiology

## 2017-11-13 VITALS — BP 122/82 | HR 58 | Ht 69.0 in | Wt 227.0 lb

## 2017-11-13 DIAGNOSIS — I25708 Atherosclerosis of coronary artery bypass graft(s), unspecified, with other forms of angina pectoris: Secondary | ICD-10-CM

## 2017-11-13 DIAGNOSIS — E7800 Pure hypercholesterolemia, unspecified: Secondary | ICD-10-CM

## 2017-11-13 DIAGNOSIS — I482 Chronic atrial fibrillation, unspecified: Secondary | ICD-10-CM

## 2017-11-13 DIAGNOSIS — I10 Essential (primary) hypertension: Secondary | ICD-10-CM | POA: Diagnosis not present

## 2017-11-13 NOTE — Patient Instructions (Signed)
Continue your current therapy  I will see you in 6 months.   

## 2017-11-16 MED ORDER — SODIUM CHLORIDE 0.9 % IV SOLN
1.0000 g | INTRAVENOUS | Status: AC
  Administered 2017-11-17: 1 g via INTRAVENOUS
  Filled 2017-11-16: qty 1

## 2017-11-17 ENCOUNTER — Inpatient Hospital Stay: Payer: PPO | Admitting: Anesthesiology

## 2017-11-17 ENCOUNTER — Encounter: Payer: Self-pay | Admitting: *Deleted

## 2017-11-17 ENCOUNTER — Inpatient Hospital Stay
Admission: RE | Admit: 2017-11-17 | Discharge: 2017-11-25 | DRG: 330 | Disposition: A | Discharge status: Home / Self Care | Payer: PPO | Source: Ambulatory Visit | Attending: Surgery | Admitting: Surgery

## 2017-11-17 ENCOUNTER — Encounter
Admission: RE | Disposition: A | Discharge status: Home / Self Care | Payer: Self-pay | Source: Ambulatory Visit | Attending: Surgery

## 2017-11-17 ENCOUNTER — Other Ambulatory Visit: Payer: Self-pay

## 2017-11-17 DIAGNOSIS — Z87891 Personal history of nicotine dependence: Secondary | ICD-10-CM | POA: Diagnosis not present

## 2017-11-17 DIAGNOSIS — I2510 Atherosclerotic heart disease of native coronary artery without angina pectoris: Secondary | ICD-10-CM | POA: Diagnosis not present

## 2017-11-17 DIAGNOSIS — E669 Obesity, unspecified: Secondary | ICD-10-CM | POA: Diagnosis not present

## 2017-11-17 DIAGNOSIS — Z951 Presence of aortocoronary bypass graft: Secondary | ICD-10-CM | POA: Diagnosis not present

## 2017-11-17 DIAGNOSIS — K56609 Unspecified intestinal obstruction, unspecified as to partial versus complete obstruction: Secondary | ICD-10-CM | POA: Diagnosis not present

## 2017-11-17 DIAGNOSIS — E7800 Pure hypercholesterolemia, unspecified: Secondary | ICD-10-CM | POA: Diagnosis present

## 2017-11-17 DIAGNOSIS — I4891 Unspecified atrial fibrillation: Secondary | ICD-10-CM | POA: Diagnosis present

## 2017-11-17 DIAGNOSIS — Z6833 Body mass index (BMI) 33.0-33.9, adult: Secondary | ICD-10-CM | POA: Diagnosis not present

## 2017-11-17 DIAGNOSIS — E876 Hypokalemia: Secondary | ICD-10-CM | POA: Diagnosis not present

## 2017-11-17 DIAGNOSIS — Z433 Encounter for attention to colostomy: Secondary | ICD-10-CM | POA: Diagnosis not present

## 2017-11-17 DIAGNOSIS — K5730 Diverticulosis of large intestine without perforation or abscess without bleeding: Secondary | ICD-10-CM

## 2017-11-17 DIAGNOSIS — K5790 Diverticulosis of intestine, part unspecified, without perforation or abscess without bleeding: Secondary | ICD-10-CM | POA: Diagnosis present

## 2017-11-17 DIAGNOSIS — K5792 Diverticulitis of intestine, part unspecified, without perforation or abscess without bleeding: Secondary | ICD-10-CM | POA: Diagnosis not present

## 2017-11-17 DIAGNOSIS — Z4659 Encounter for fitting and adjustment of other gastrointestinal appliance and device: Secondary | ICD-10-CM

## 2017-11-17 DIAGNOSIS — Z85828 Personal history of other malignant neoplasm of skin: Secondary | ICD-10-CM | POA: Diagnosis not present

## 2017-11-17 DIAGNOSIS — K567 Ileus, unspecified: Secondary | ICD-10-CM

## 2017-11-17 DIAGNOSIS — Z4682 Encounter for fitting and adjustment of non-vascular catheter: Secondary | ICD-10-CM | POA: Diagnosis not present

## 2017-11-17 DIAGNOSIS — I10 Essential (primary) hypertension: Secondary | ICD-10-CM | POA: Diagnosis not present

## 2017-11-17 HISTORY — PX: COLOSTOMY CLOSURE: SHX1381

## 2017-11-17 LAB — CBC
HCT: 43.1 % (ref 40.0–52.0)
Hemoglobin: 14.5 g/dL (ref 13.0–18.0)
MCH: 29 pg (ref 26.0–34.0)
MCHC: 33.7 g/dL (ref 32.0–36.0)
MCV: 86.3 fL (ref 80.0–100.0)
PLATELETS: 151 10*3/uL (ref 150–440)
RBC: 4.99 MIL/uL (ref 4.40–5.90)
RDW: 14.9 % — AB (ref 11.5–14.5)
WBC: 9.7 10*3/uL (ref 3.8–10.6)

## 2017-11-17 LAB — CREATININE, SERUM: CREATININE: 0.71 mg/dL (ref 0.61–1.24)

## 2017-11-17 SURGERY — COLOSTOMY CLOSURE
Anesthesia: General | Wound class: Clean Contaminated

## 2017-11-17 MED ORDER — EZETIMIBE 10 MG PO TABS
10.0000 mg | ORAL_TABLET | Freq: Every day | ORAL | Status: DC
  Administered 2017-11-17 – 2017-11-24 (×6): 10 mg via ORAL
  Filled 2017-11-17 (×9): qty 1

## 2017-11-17 MED ORDER — DEXTROSE-NACL 5-0.9 % IV SOLN
INTRAVENOUS | Status: DC
  Administered 2017-11-17 – 2017-11-20 (×6): via INTRAVENOUS

## 2017-11-17 MED ORDER — DEXAMETHASONE SODIUM PHOSPHATE 10 MG/ML IJ SOLN
INTRAMUSCULAR | Status: DC | PRN
  Administered 2017-11-17: 10 mg via INTRAVENOUS

## 2017-11-17 MED ORDER — FENTANYL CITRATE (PF) 100 MCG/2ML IJ SOLN
INTRAMUSCULAR | Status: DC | PRN
  Administered 2017-11-17 (×6): 50 ug via INTRAVENOUS

## 2017-11-17 MED ORDER — SIMVASTATIN 10 MG PO TABS
10.0000 mg | ORAL_TABLET | Freq: Every day | ORAL | Status: DC
  Administered 2017-11-17 – 2017-11-24 (×6): 10 mg via ORAL
  Filled 2017-11-17 (×9): qty 1

## 2017-11-17 MED ORDER — LIDOCAINE HCL (CARDIAC) 20 MG/ML IV SOLN
INTRAVENOUS | Status: DC | PRN
  Administered 2017-11-17: 100 mg via INTRAVENOUS

## 2017-11-17 MED ORDER — KETOROLAC TROMETHAMINE 30 MG/ML IJ SOLN
INTRAMUSCULAR | Status: DC | PRN
  Administered 2017-11-17: 30 mg via INTRAVENOUS

## 2017-11-17 MED ORDER — HEPARIN SODIUM (PORCINE) 5000 UNIT/ML IJ SOLN
5000.0000 [IU] | Freq: Once | INTRAMUSCULAR | Status: AC
  Administered 2017-11-17: 5000 [IU] via SUBCUTANEOUS

## 2017-11-17 MED ORDER — HEPARIN SODIUM (PORCINE) 5000 UNIT/ML IJ SOLN
INTRAMUSCULAR | Status: AC
  Administered 2017-11-17: 5000 [IU] via SUBCUTANEOUS
  Filled 2017-11-17: qty 1

## 2017-11-17 MED ORDER — PHENYLEPHRINE HCL 10 MG/ML IJ SOLN
INTRAMUSCULAR | Status: AC
  Filled 2017-11-17: qty 1

## 2017-11-17 MED ORDER — FAMOTIDINE 20 MG PO TABS
20.0000 mg | ORAL_TABLET | Freq: Once | ORAL | Status: AC
  Administered 2017-11-17: 20 mg via ORAL

## 2017-11-17 MED ORDER — DIPHENHYDRAMINE HCL 12.5 MG/5ML PO ELIX
12.5000 mg | ORAL_SOLUTION | Freq: Four times a day (QID) | ORAL | Status: DC | PRN
  Filled 2017-11-17: qty 5

## 2017-11-17 MED ORDER — CHLORHEXIDINE GLUCONATE CLOTH 2 % EX PADS: 6.0000 | MEDICATED_PAD | Freq: Once | CUTANEOUS | Status: DC

## 2017-11-17 MED ORDER — ONDANSETRON HCL 4 MG PO TABS: 4.0000 mg | ORAL_TABLET | Freq: Four times a day (QID) | ORAL | Status: DC | PRN

## 2017-11-17 MED ORDER — PROPOFOL 10 MG/ML IV BOLUS
INTRAVENOUS | Status: DC | PRN
  Administered 2017-11-17: 150 mg via INTRAVENOUS

## 2017-11-17 MED ORDER — NITROGLYCERIN 0.4 MG SL SUBL: 0.4000 mg | SUBLINGUAL_TABLET | SUBLINGUAL | Status: DC | PRN

## 2017-11-17 MED ORDER — DEXAMETHASONE SODIUM PHOSPHATE 10 MG/ML IJ SOLN
INTRAMUSCULAR | Status: AC
  Filled 2017-11-17: qty 1

## 2017-11-17 MED ORDER — PHENYLEPHRINE HCL 10 MG/ML IJ SOLN
INTRAMUSCULAR | Status: DC | PRN
  Administered 2017-11-17 (×4): 100 ug via INTRAVENOUS

## 2017-11-17 MED ORDER — SEVOFLURANE IN SOLN
RESPIRATORY_TRACT | Status: AC
  Filled 2017-11-17: qty 250

## 2017-11-17 MED ORDER — LACTATED RINGERS IV SOLN
INTRAVENOUS | Status: DC
  Administered 2017-11-17 (×2): via INTRAVENOUS

## 2017-11-17 MED ORDER — SUCCINYLCHOLINE CHLORIDE 20 MG/ML IJ SOLN
INTRAMUSCULAR | Status: DC | PRN
  Administered 2017-11-17: 100 mg via INTRAVENOUS

## 2017-11-17 MED ORDER — OXYCODONE HCL 5 MG PO TABS: 5.0000 mg | ORAL_TABLET | Freq: Once | ORAL | Status: DC | PRN

## 2017-11-17 MED ORDER — LIDOCAINE HCL (PF) 2 % IJ SOLN
INTRAMUSCULAR | Status: AC
  Filled 2017-11-17: qty 10

## 2017-11-17 MED ORDER — ROCURONIUM BROMIDE 50 MG/5ML IV SOLN
INTRAVENOUS | Status: AC
  Filled 2017-11-17: qty 1

## 2017-11-17 MED ORDER — ACETAMINOPHEN 10 MG/ML IV SOLN
INTRAVENOUS | Status: DC | PRN
  Administered 2017-11-17: 1000 mg via INTRAVENOUS

## 2017-11-17 MED ORDER — PROMETHAZINE HCL 25 MG/ML IJ SOLN: 6.2500 mg | INTRAMUSCULAR | Status: DC | PRN

## 2017-11-17 MED ORDER — CHLORHEXIDINE GLUCONATE CLOTH 2 % EX PADS
6.0000 | MEDICATED_PAD | Freq: Once | CUTANEOUS | Status: AC
  Administered 2017-11-17: 6 via TOPICAL

## 2017-11-17 MED ORDER — CARVEDILOL 3.125 MG PO TABS
3.1250 mg | ORAL_TABLET | Freq: Two times a day (BID) | ORAL | Status: DC
  Administered 2017-11-17 – 2017-11-25 (×15): 3.125 mg via ORAL
  Filled 2017-11-17 (×17): qty 1

## 2017-11-17 MED ORDER — HYDROMORPHONE 1 MG/ML IV SOLN
INTRAVENOUS | Status: DC
  Administered 2017-11-17: 0.8 mg via INTRAVENOUS
  Administered 2017-11-17: 0.6 mg via INTRAVENOUS
  Administered 2017-11-17: 25 mg via INTRAVENOUS
  Administered 2017-11-18: 0.3 mg via INTRAVENOUS
  Administered 2017-11-18: 0 mg via INTRAVENOUS
  Administered 2017-11-18: 0.9 mg via INTRAVENOUS
  Administered 2017-11-18: 0.6 mg via INTRAVENOUS
  Administered 2017-11-18: 0.3 mg via INTRAVENOUS
  Administered 2017-11-18: 0.9 mg via INTRAVENOUS
  Administered 2017-11-19 (×2): 0.6 mg via INTRAVENOUS
  Administered 2017-11-19 (×2): 1.2 mg via INTRAVENOUS
  Administered 2017-11-19: 0.6 mg via INTRAVENOUS
  Filled 2017-11-17: qty 25

## 2017-11-17 MED ORDER — SUGAMMADEX SODIUM 200 MG/2ML IV SOLN
INTRAVENOUS | Status: DC | PRN
  Administered 2017-11-17: 206 mg via INTRAVENOUS

## 2017-11-17 MED ORDER — ONDANSETRON HCL 4 MG/2ML IJ SOLN: 4.0000 mg | Freq: Four times a day (QID) | INTRAMUSCULAR | Status: DC | PRN

## 2017-11-17 MED ORDER — SUGAMMADEX SODIUM 500 MG/5ML IV SOLN
INTRAVENOUS | Status: AC
  Filled 2017-11-17: qty 5

## 2017-11-17 MED ORDER — FENTANYL CITRATE (PF) 100 MCG/2ML IJ SOLN
INTRAMUSCULAR | Status: AC
  Filled 2017-11-17: qty 4

## 2017-11-17 MED ORDER — SPIRONOLACTONE 12.5 MG HALF TABLET
12.5000 mg | ORAL_TABLET | Freq: Every day | ORAL | Status: DC
  Administered 2017-11-17 – 2017-11-25 (×9): 12.5 mg via ORAL
  Filled 2017-11-17 (×9): qty 1

## 2017-11-17 MED ORDER — MIDAZOLAM HCL 2 MG/2ML IJ SOLN
INTRAMUSCULAR | Status: AC
  Filled 2017-11-17: qty 2

## 2017-11-17 MED ORDER — HEPARIN SODIUM (PORCINE) 5000 UNIT/ML IJ SOLN: 5000.0000 [IU] | Freq: Three times a day (TID) | INTRAMUSCULAR | Status: DC

## 2017-11-17 MED ORDER — ONDANSETRON HCL 4 MG/2ML IJ SOLN
INTRAMUSCULAR | Status: AC
  Filled 2017-11-17: qty 2

## 2017-11-17 MED ORDER — ACETAMINOPHEN 10 MG/ML IV SOLN
INTRAVENOUS | Status: AC
  Filled 2017-11-17: qty 100

## 2017-11-17 MED ORDER — ONDANSETRON HCL 4 MG/2ML IJ SOLN
INTRAMUSCULAR | Status: DC | PRN
  Administered 2017-11-17: 4 mg via INTRAVENOUS

## 2017-11-17 MED ORDER — ONDANSETRON HCL 4 MG/2ML IJ SOLN
4.0000 mg | Freq: Four times a day (QID) | INTRAMUSCULAR | Status: DC | PRN
  Administered 2017-11-20 (×3): 4 mg via INTRAVENOUS
  Filled 2017-11-17 (×3): qty 2

## 2017-11-17 MED ORDER — ROCURONIUM BROMIDE 100 MG/10ML IV SOLN
INTRAVENOUS | Status: DC | PRN
  Administered 2017-11-17: 30 mg via INTRAVENOUS
  Administered 2017-11-17: 10 mg via INTRAVENOUS
  Administered 2017-11-17: 20 mg via INTRAVENOUS
  Administered 2017-11-17: 40 mg via INTRAVENOUS

## 2017-11-17 MED ORDER — SODIUM CHLORIDE 0.9% FLUSH: 9.0000 mL | INTRAVENOUS | Status: DC | PRN

## 2017-11-17 MED ORDER — MEPERIDINE HCL 50 MG/ML IJ SOLN: 6.2500 mg | INTRAMUSCULAR | Status: DC | PRN

## 2017-11-17 MED ORDER — FAMOTIDINE 20 MG PO TABS
ORAL_TABLET | ORAL | Status: AC
  Administered 2017-11-17: 20 mg via ORAL
  Filled 2017-11-17: qty 1

## 2017-11-17 MED ORDER — FENTANYL CITRATE (PF) 100 MCG/2ML IJ SOLN
INTRAMUSCULAR | Status: AC
  Filled 2017-11-17: qty 2

## 2017-11-17 MED ORDER — OXYCODONE HCL 5 MG/5ML PO SOLN: 5.0000 mg | Freq: Once | ORAL | Status: DC | PRN

## 2017-11-17 MED ORDER — MIDAZOLAM HCL 2 MG/2ML IJ SOLN
INTRAMUSCULAR | Status: DC | PRN
  Administered 2017-11-17: 2 mg via INTRAVENOUS

## 2017-11-17 MED ORDER — SUCCINYLCHOLINE CHLORIDE 20 MG/ML IJ SOLN
INTRAMUSCULAR | Status: AC
  Filled 2017-11-17: qty 1

## 2017-11-17 MED ORDER — LORATADINE 10 MG PO TABS
10.0000 mg | ORAL_TABLET | Freq: Every day | ORAL | Status: DC
  Administered 2017-11-17 – 2017-11-25 (×9): 10 mg via ORAL
  Filled 2017-11-17 (×9): qty 1

## 2017-11-17 MED ORDER — NALOXONE HCL 0.4 MG/ML IJ SOLN: 0.4000 mg | INTRAMUSCULAR | Status: DC | PRN

## 2017-11-17 MED ORDER — RIVAROXABAN 20 MG PO TABS
20.0000 mg | ORAL_TABLET | Freq: Every day | ORAL | Status: DC
  Administered 2017-11-17 – 2017-11-24 (×8): 20 mg via ORAL
  Filled 2017-11-17 (×9): qty 1

## 2017-11-17 MED ORDER — DIPHENHYDRAMINE HCL 50 MG/ML IJ SOLN: 12.5000 mg | Freq: Four times a day (QID) | INTRAMUSCULAR | Status: DC | PRN

## 2017-11-17 MED ORDER — BUPIVACAINE-EPINEPHRINE (PF) 0.5% -1:200000 IJ SOLN
INTRAMUSCULAR | Status: AC
  Filled 2017-11-17: qty 30

## 2017-11-17 MED ORDER — FENTANYL CITRATE (PF) 100 MCG/2ML IJ SOLN: 25.0000 ug | INTRAMUSCULAR | Status: DC | PRN

## 2017-11-17 SURGICAL SUPPLY — 41 items
CANISTER SUCT 1200ML W/VALVE (MISCELLANEOUS) ×3 IMPLANT
DRAPE INCISE IOBAN 66X45 STRL (DRAPES) ×3 IMPLANT
DRAPE LAPAROTOMY 100X77 ABD (DRAPES) ×3 IMPLANT
DRAPE LEGGINS SURG 28X43 STRL (DRAPES) ×3 IMPLANT
DRAPE TABLE BACK 80X90 (DRAPES) ×3 IMPLANT
DRAPE UNDER BUTTOCK W/FLU (DRAPES) ×3 IMPLANT
DRAPE UTILITY 15X26 TOWEL STRL (DRAPES) ×6 IMPLANT
DRSG OPSITE POSTOP 4X14 (GAUZE/BANDAGES/DRESSINGS) ×3 IMPLANT
DRSG OPSITE POSTOP 4X6 (GAUZE/BANDAGES/DRESSINGS) ×3 IMPLANT
ELECT BLADE 6.5 EXT (BLADE) ×3 IMPLANT
ELECT CAUTERY BLADE 6.4 (BLADE) ×3 IMPLANT
ELECT REM PT RETURN 9FT ADLT (ELECTROSURGICAL) ×3
ELECTRODE REM PT RTRN 9FT ADLT (ELECTROSURGICAL) ×1 IMPLANT
GLOVE BIO SURGEON STRL SZ8 (GLOVE) ×12 IMPLANT
GOWN STRL REUS W/ TWL LRG LVL3 (GOWN DISPOSABLE) ×4 IMPLANT
GOWN STRL REUS W/TWL LRG LVL3 (GOWN DISPOSABLE) ×12
LABEL OR SOLS (LABEL) ×3 IMPLANT
LIGASURE LAP MARYLAND 5MM 37CM (ELECTROSURGICAL) ×3 IMPLANT
NS IRRIG 1000ML POUR BTL (IV SOLUTION) ×3 IMPLANT
PACK BASIN MAJOR ARMC (MISCELLANEOUS) ×3 IMPLANT
PACK COLON CLEAN CLOSURE (MISCELLANEOUS) ×3 IMPLANT
RETAINER VISCERA MED (MISCELLANEOUS) ×3 IMPLANT
SOL PREP PVP 2OZ (MISCELLANEOUS) ×3
SOLUTION PREP PVP 2OZ (MISCELLANEOUS) ×1 IMPLANT
SPONGE LAP 18X18 5 PK (GAUZE/BANDAGES/DRESSINGS) ×5 IMPLANT
STAPLER CIRCULAR 29MM (STAPLE) ×2 IMPLANT
STAPLER GUN LINEAR PROX 60 (STAPLE) ×2 IMPLANT
STAPLER SKIN PROX 35W (STAPLE) ×3 IMPLANT
SUT NYLON 2-0 (SUTURE) ×3 IMPLANT
SUT PDS AB 1 TP1 96 (SUTURE) ×6 IMPLANT
SUT PROLENE 1 CT (SUTURE) ×6 IMPLANT
SUT PROLENE 3 0 SH DA (SUTURE) ×2 IMPLANT
SUT SILK 0 (SUTURE) ×3
SUT SILK 0 30XBRD TIE 6 (SUTURE) IMPLANT
SUT SILK 3-0 (SUTURE) ×8 IMPLANT
SUT VIC AB 3-0 SH 27 (SUTURE) ×6
SUT VIC AB 3-0 SH 27X BRD (SUTURE) ×2 IMPLANT
SUT VICRYL PLUS ABS 0 54 (SUTURE) ×3 IMPLANT
SUTURE VIC 1-0 (SUTURE) ×12 IMPLANT
SYR BULB IRRIG 60ML STRL (SYRINGE) ×5 IMPLANT
TRAY FOLEY W/METER SILVER 16FR (SET/KITS/TRAYS/PACK) ×3 IMPLANT

## 2017-11-17 NOTE — Transfer of Care (Signed)
Immediate Anesthesia Transfer of Care Note  Patient: Ryan Mccall  Procedure(s) Performed: COLOSTOMY CLOSURE (N/A )  Patient Location: PACU  Anesthesia Type:General  Level of Consciousness: awake and sedated  Airway & Oxygen Therapy: Patient Spontanous Breathing and Patient connected to face mask oxygen  Post-op Assessment: Report given to RN and Post -op Vital signs reviewed and stable  Post vital signs: Reviewed and stable  Last Vitals:  Vitals:   11/17/17 0817  BP: 108/78  Pulse: 69  Resp: 16  Temp: (!) 36.1 C    Last Pain:  Vitals:   11/17/17 0817  TempSrc: Tympanic  PainSc: 0-No pain         Complications: No apparent anesthesia complications

## 2017-11-17 NOTE — Anesthesia Postprocedure Evaluation (Signed)
Anesthesia Post Note  Patient: Ryan Mccall  Procedure(s) Performed: COLOSTOMY CLOSURE (N/A )  Patient location during evaluation: PACU Anesthesia Type: General Level of consciousness: awake and alert and oriented Pain management: pain level controlled Vital Signs Assessment: post-procedure vital signs reviewed and stable Respiratory status: spontaneous breathing, nonlabored ventilation and respiratory function stable Cardiovascular status: blood pressure returned to baseline and stable Postop Assessment: no signs of nausea or vomiting Anesthetic complications: no     Last Vitals:  Vitals:   11/17/17 1128 11/17/17 1143  BP: (!) 151/95 (!) 143/75  Pulse: 70 69  Resp: 12 12  Temp: 36.5 C   SpO2: 100% 100%    Last Pain:  Vitals:   11/17/17 1128  TempSrc:   PainSc: Asleep                 Herson Prichard

## 2017-11-17 NOTE — Op Note (Signed)
11/17/2017 Ryan Mccall  Pre-operative Diagnosis: Diverticulitis with colostomy  Post-operative Diagnosis: Same  Procedure: Colostomy closure  Surgeon: Jerrol Banana. Burt Knack, MD FACS  Anesthesia: Gen. with endotracheal tube  Assistant: Dr. Marta Lamas (Dr. Faith Rogue was necessary for guidance and physical assistance during the operation.  Post (  Procedure Details  The patient was seen again in the Holding Room. The benefits, complications, treatment options, and expected outcomes were discussed with the patient. The risks of bleeding, infection, recurrence of symptoms, failure to resolve symptoms,  bowel injury, any of which could require further surgery were reviewed with the patient.  We also discussed the risk of anastomotic leak requiring a additional ostomy.  The patient was taken to Operating Room, identified as Ryan Mccall and the procedure verified.  A Time Out was held and the above information confirmed.  Prior to the induction of general anesthesia, antibiotic prophylaxis was administered. VTE prophylaxis was in place. General endotracheal anesthesia was then administered and tolerated well. After the induction, the abdomen was prepped with Chloraprep and draped in the sterile fashion. The patient was positioned in the well padded low lithotomy position.  Once patient was prepped draped sterile fashion a surgical timeout was held.  And then a midline incision was utilized to open explore the abdominal cavity.  Film the soft adhesions were taken down either sharply or with the use of electrocautery being sure to not injure bowel.  The rectosigmoid junction was identified and was found to contain significant segment of noninflamed sigmoid colon with diverticula.  No active disease identified.  The colostomy site was excised via a lenticular shaped incision which was brought down to the fascia and then reduced.  The ostomy site was closed with interrupted figure-of-eight 0 Prolene sutures both  from the inside and outside layers.  Further adhesions were taken down and the lateral extent and avascular line was taken down further up towards the spleen.  It was not necessary to take down the splenic flexure.  The rectosigmoid was elevated and the redundant sigmoid remaining from the prior operation was excised by dividing the mesentery between clamps and tying with 2-0 silk and then firing a TA stapler.  The specimen was passed off for examination.  The ostomy site was excised with electrocautery and vasculature was tied with 2-0 silk as well.  The ostomy site was sized with EEA sizers to 29.  There was no active disease and no need to excise additional material other than from the colostomy site itself.  An EEA anvil was sewn in size 29 with a pursestring of 3-0 Prolene.  Dr. Faith Rogue went below and performed a rectal exam and then placed EEA sizers to the site of proposed anastomosis.  He then placed the EEA stapler and it was connected and fired without difficulty.  Reinforcing sutures of 3-0 silk were placed laterally and somewhat anteriorly.  Then a portion of warm saline was placed into the pelvis and retrograde placement of air through the rectum was performed by Dr. Faith Rogue.  There is no sign of leak and retrograde flow of air up through the anastomosis was identifiable quite easily without sign of leak.  Once assuring that hemostasis was adequate Marcaine was infiltrated into the skin is obtains tissues for a total of 30 cc.  Clean closure was then performed after changing gown and gloves drapes and instruments.  A piece of Seprafilm was placed and the wound was closed with running #1 PDS and then skin staples  were placed over Penrose drains on the midline incision and sterile dressings were placed.  All sponge lap needle counts were correct both for the clean closure and the previous or initial portion of the operation as well.  Patient was taken to recovery room in stable condition to be  admitted for continued care  Findings: Minimal adhesions, no active inflammation.   Estimated Blood Loss: 150 cc         Drains: None         Specimens: Rectosigmoid with diverticulosis, EEA donuts, ostomy site       Complications: None              Condition: Stable   Ryan Mccall E. Burt Knack, MD, FACS

## 2017-11-17 NOTE — Anesthesia Preprocedure Evaluation (Signed)
Anesthesia Evaluation  Patient identified by MRN, date of birth, ID band Patient awake    Reviewed: Allergy & Precautions, NPO status , Patient's Chart, lab work & pertinent test results  History of Anesthesia Complications Negative for: history of anesthetic complications  Airway Mallampati: III  TM Distance: >3 FB Neck ROM: Full    Dental  (+) Upper Dentures, Lower Dentures   Pulmonary neg pulmonary ROS, neg sleep apnea, neg COPD, former smoker (quit 1999),    breath sounds clear to auscultation- rhonchi (-) wheezing      Cardiovascular Exercise Tolerance: Good hypertension, Pt. on medications + CAD and + CABG (1999)  (-) Cardiac Stents + dysrhythmias Atrial Fibrillation  Rhythm:Regular Rate:Normal - Systolic murmurs and - Diastolic murmurs Stress test 04/11/16:  Nuclear stress EF: 54%. The LV systolic function is normal  There was no ST segment deviation noted during stress.  The study is normal. no ischemia. No infarction   Neuro/Psych negative neurological ROS  negative psych ROS   GI/Hepatic negative GI ROS, Neg liver ROS,   Endo/Other  negative endocrine ROSneg diabetes  Renal/GU negative Renal ROS     Musculoskeletal negative musculoskeletal ROS (+)   Abdominal (+) + obese,   Peds  Hematology negative hematology ROS (+)   Anesthesia Other Findings Past Medical History: No date: Atrial fibrillation (HCC) No date: Coronary artery disease     Comment:  s/p remote CABG 1999 per Dr. 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   Reproductive/Obstetrics                             Anesthesia Physical Anesthesia Plan  ASA: III  Anesthesia Plan: General   Post-op Pain Management:    Induction:  Intravenous  PONV Risk Score and Plan: 1 and Ondansetron  Airway Management Planned: Oral ETT  Additional Equipment:   Intra-op Plan:   Post-operative Plan: Extubation in OR  Informed Consent: I have reviewed the patients History and Physical, chart, labs and discussed the procedure including the risks, benefits and alternatives for the proposed anesthesia with the patient or authorized representative who has indicated his/her understanding and acceptance.   Dental advisory given  Plan Discussed with: CRNA and Anesthesiologist  Anesthesia Plan Comments:         Anesthesia Quick Evaluation

## 2017-11-17 NOTE — Progress Notes (Signed)
Preoperative Review   Patient is met in the preoperative holding area. The history is reviewed in the chart and with the patient. I personally reviewed the options and rationale as well as the risks of this procedure that have been previously discussed with the patient. All questions asked by the patient and/or family were answered to their satisfaction.  Colonoscopy results have been reviewed.  Physical exam shows a normal-appearing male in no acute distress chest is clear to auscultation cardiac is regular rate and rhythm abdomen is soft and nontender ostomy present.  Patient agrees to proceed with this procedure at this time.  Options rationale and risks were reviewed with the patient including the risk of repeat surgery for colostomy or ileostomy due to anastomotic leak.  He understood and agreed to proceed.  Florene Glen M.D. FACS

## 2017-11-17 NOTE — Anesthesia Post-op Follow-up Note (Signed)
Anesthesia QCDR form completed.        

## 2017-11-17 NOTE — Anesthesia Procedure Notes (Signed)
Procedure Name: Intubation Date/Time: 11/17/2017 9:26 AM Performed by: Nelda Marseille, CRNA Pre-anesthesia Checklist: Patient identified, Patient being monitored, Timeout performed, Emergency Drugs available and Suction available Patient Re-evaluated:Patient Re-evaluated prior to induction Oxygen Delivery Method: Circle system utilized Preoxygenation: Pre-oxygenation with 100% oxygen Induction Type: IV induction Ventilation: Mask ventilation without difficulty Laryngoscope Size: Mac and 3 Grade View: Grade II Tube type: Oral Tube size: 7.5 mm Number of attempts: 1 Airway Equipment and Method: Stylet Placement Confirmation: ETT inserted through vocal cords under direct vision,  positive ETCO2 and breath sounds checked- equal and bilateral Secured at: 21 cm Tube secured with: Tape Dental Injury: Teeth and Oropharynx as per pre-operative assessment

## 2017-11-18 ENCOUNTER — Encounter: Payer: Self-pay | Admitting: Surgery

## 2017-11-18 ENCOUNTER — Other Ambulatory Visit: Payer: Self-pay

## 2017-11-18 LAB — COMPREHENSIVE METABOLIC PANEL
ALT: 20 U/L (ref 17–63)
AST: 39 U/L (ref 15–41)
Albumin: 3.3 g/dL — ABNORMAL LOW (ref 3.5–5.0)
Alkaline Phosphatase: 37 U/L — ABNORMAL LOW (ref 38–126)
Anion gap: 9 (ref 5–15)
BILIRUBIN TOTAL: 1.2 mg/dL (ref 0.3–1.2)
BUN: 16 mg/dL (ref 6–20)
CHLORIDE: 106 mmol/L (ref 101–111)
CO2: 22 mmol/L (ref 22–32)
Calcium: 8.1 mg/dL — ABNORMAL LOW (ref 8.9–10.3)
Creatinine, Ser: 0.94 mg/dL (ref 0.61–1.24)
GFR calc Af Amer: >60 mL/min (ref >60)
GFR calc non Af Amer: >60 mL/min (ref >60)
GLUCOSE: 173 mg/dL — AB (ref 65–99)
Potassium: 4.2 mmol/L (ref 3.5–5.1)
SODIUM: 137 mmol/L (ref 135–145)
TOTAL PROTEIN: 6.3 g/dL — AB (ref 6.5–8.1)

## 2017-11-18 LAB — SURGICAL PATHOLOGY

## 2017-11-18 NOTE — Progress Notes (Signed)
1 Day Post-Op  Subjective: Status post colostomy closure.  Patient feels well today.  No nausea vomiting no flatus yet  Objective: Vital signs in last 24 hours: Temp:  [96.9 F (36.1 C)-98.9 F (37.2 C)] 97.8 F (36.6 C) (02/19 2343) Pulse Rate:  [69-95] 87 (02/20 0455) Resp:  [10-21] 19 (02/20 0428) BP: (98-151)/(64-95) 98/65 (02/20 0455) SpO2:  [93 %-100 %] 96 % (02/20 0455) Weight:  [227 lb (103 kg)] 227 lb (103 kg) (02/19 0817) Last BM Date: 11/17/17  Intake/Output from previous day: 02/19 0701 - 02/20 0700 In: 3482 [P.O.:300; I.V.:3182] Out: 1145 [Urine:995; Blood:150] Intake/Output this shift: No intake/output data recorded.  Physical exam:  Abdomen is soft nontender wound is clean both wounds with Penrose drains.  Lab Results: CBC  Recent Labs    11/17/17 1423  WBC 9.7  HGB 14.5  HCT 43.1  PLT 151   BMET Recent Labs    11/17/17 1423 11/18/17 0408  NA  --  137  K  --  4.2  CL  --  106  CO2  --  22  GLUCOSE  --  173*  BUN  --  16  CREATININE 0.71 0.94  CALCIUM  --  8.1*   PT/INR No results for input(s): LABPROT, INR in the last 72 hours. ABG No results for input(s): PHART, HCO3 in the last 72 hours.  Invalid input(s): PCO2, PO2  Studies/Results: No results found.  Anti-infectives: Anti-infectives (From admission, onward)   Start     Dose/Rate Route Frequency Ordered Stop   11/17/17 0600  ertapenem (INVANZ) 1 g in sodium chloride 0.9 % 100 mL IVPB     1 g 200 mL/hr over 30 Minutes Intravenous On call to O.R. 11/16/17 2155 11/17/17 2025      Assessment/Plan: s/p Procedure(s): COLOSTOMY CLOSURE   Labs reviewed.  Patient doing very well.  Will change dressing today and await bowel function before advancing diet past clears.  Florene Glen, MD, FACS  11/18/2017

## 2017-11-19 NOTE — Progress Notes (Signed)
2 Days Post-Op  Subjective: Status post colon resection and colostomy closure.  Patient doing very well he is passing gas at this point having minimal pain  Objective: Vital signs in last 24 hours: Temp:  [97.5 F (36.4 C)-98.4 F (36.9 C)] 98.4 F (36.9 C) (02/21 0414) Pulse Rate:  [85-105] 90 (02/21 0414) Resp:  [12-20] 17 (02/21 0417) BP: (110-129)/(71-77) 110/71 (02/21 0414) SpO2:  [93 %-98 %] 94 % (02/21 0417) FiO2 (%):  [93 %-97 %] 94 % (02/21 0417) Last BM Date: 11/16/17  Intake/Output from previous day: 02/20 0701 - 02/21 0700 In: 3451.2 [P.O.:1220; I.V.:2231.2] Out: 1200 [Urine:1200] Intake/Output this shift: No intake/output data recorded.  Physical exam:  Wounds are clean with Penrose drains soft nontender abdomen nontender calves  Lab Results: CBC  Recent Labs    11/17/17 1423  WBC 9.7  HGB 14.5  HCT 43.1  PLT 151   BMET Recent Labs    11/17/17 1423 11/18/17 0408  NA  --  137  K  --  4.2  CL  --  106  CO2  --  22  GLUCOSE  --  173*  BUN  --  16  CREATININE 0.71 0.94  CALCIUM  --  8.1*   PT/INR No results for input(s): LABPROT, INR in the last 72 hours. ABG No results for input(s): PHART, HCO3 in the last 72 hours.  Invalid input(s): PCO2, PO2  Studies/Results: No results found.  Anti-infectives: Anti-infectives (From admission, onward)   Start     Dose/Rate Route Frequency Ordered Stop   11/17/17 0600  ertapenem (INVANZ) 1 g in sodium chloride 0.9 % 100 mL IVPB     1 g 200 mL/hr over 30 Minutes Intravenous On call to O.R. 11/16/17 2155 11/17/17 2025      Assessment/Plan: s/p Procedure(s): COLOSTOMY CLOSURE   Passing gas at this point will advance diet and decrease IV fluids  Florene Glen, MD, FACS  11/19/2017

## 2017-11-20 ENCOUNTER — Inpatient Hospital Stay: Payer: PPO

## 2017-11-20 MED ORDER — HYDROMORPHONE 1 MG/ML IV SOLN
INTRAVENOUS | Status: DC
  Administered 2017-11-22 (×3): 0.3 mg via INTRAVENOUS
  Administered 2017-11-23: 0 mg via INTRAVENOUS
  Administered 2017-11-24: 0.3 mg via INTRAVENOUS

## 2017-11-20 MED ORDER — PROMETHAZINE HCL 25 MG/ML IJ SOLN
12.5000 mg | Freq: Four times a day (QID) | INTRAMUSCULAR | Status: DC | PRN
  Administered 2017-11-20 – 2017-11-22 (×2): 12.5 mg via INTRAVENOUS
  Filled 2017-11-20 (×2): qty 1

## 2017-11-20 MED ORDER — DEXTROSE IN LACTATED RINGERS 5 % IV SOLN
INTRAVENOUS | Status: DC
  Administered 2017-11-20 – 2017-11-23 (×5): via INTRAVENOUS

## 2017-11-20 MED ORDER — NALOXONE HCL 0.4 MG/ML IJ SOLN: 0.4000 mg | INTRAMUSCULAR | Status: DC | PRN

## 2017-11-20 MED ORDER — DIPHENHYDRAMINE HCL 12.5 MG/5ML PO ELIX
12.5000 mg | ORAL_SOLUTION | Freq: Four times a day (QID) | ORAL | Status: DC | PRN
  Filled 2017-11-20: qty 5

## 2017-11-20 MED ORDER — SODIUM CHLORIDE 0.9% FLUSH: 9.0000 mL | INTRAVENOUS | Status: DC | PRN

## 2017-11-20 NOTE — Care Management Important Message (Signed)
Important Message  Patient Details  Name: Ryan Mccall MRN: [id number] Date of Birth: 05/06/1946   Medicare Important Message Given:  Yes    Beverly Sessions, RN 11/20/2017, 11:18 AM

## 2017-11-20 NOTE — Progress Notes (Signed)
3 Days Post-Op  Subjective: Status post colostomy closure.  Patient vomited early this morning approximately 3 AM.  He is not feeling well this morning.  Yesterday we had backed him off of his full liquid diet as he was not passing much gas and felt distended.  He is distended this morning.  He is passing gas but not as much as he was earlier in his hospital course.  Objective: Vital signs in last 24 hours: Temp:  [98.2 F (36.8 C)-99 F (37.2 C)] 98.9 F (37.2 C) (02/22 0511) Pulse Rate:  [82-100] 92 (02/22 0511) Resp:  [14-20] 20 (02/22 0511) BP: (108-120)/(69-79) 108/69 (02/22 0511) SpO2:  [93 %-97 %] 93 % (02/22 0511) FiO2 (%):  [95 %] 95 % (02/21 2034) Last BM Date: 11/19/17(blood clot-MD aware)  Intake/Output from previous day: 02/21 0701 - 02/22 0700 In: 2423 [P.O.:960; I.V.:1463] Out: 1350 [Urine:1250; Emesis/NG output:100] Intake/Output this shift: No intake/output data recorded.  Physical exam:  Uncomfortable appearing male patient vital signs are stable Abdomen is distended minimally tympanitic wound is clean no erythema no drainage nontender calves  Lab Results: CBC  Recent Labs    11/17/17 1423  WBC 9.7  HGB 14.5  HCT 43.1  PLT 151   BMET Recent Labs    11/17/17 1423 11/18/17 0408  NA  --  137  K  --  4.2  CL  --  106  CO2  --  22  GLUCOSE  --  173*  BUN  --  16  CREATININE 0.71 0.94  CALCIUM  --  8.1*   PT/INR No results for input(s): LABPROT, INR in the last 72 hours. ABG No results for input(s): PHART, HCO3 in the last 72 hours.  Invalid input(s): PCO2, PO2  Studies/Results: No results found.  Anti-infectives: Anti-infectives (From admission, onward)   Start     Dose/Rate Route Frequency Ordered Stop   11/17/17 0600  ertapenem (INVANZ) 1 g in sodium chloride 0.9 % 100 mL IVPB     1 g 200 mL/hr over 30 Minutes Intravenous On call to O.R. 11/16/17 2155 11/17/17 2025      Assessment/Plan: s/p Procedure(s): COLOSTOMY  CLOSURE   Suspect postop ileus after advancing diet.  Will place on clear liquids and restart IVs.  Control nausea.  Florene Glen, MD, FACS  11/20/2017

## 2017-11-20 NOTE — Progress Notes (Signed)
Chaplain met with patient and wife and provided emotional support, prayer, and presence.

## 2017-11-20 NOTE — Progress Notes (Signed)
The patient had another small volume emesis (less than 30 cc).  He remains bloated.  He is having no abdominal pain to speak of.  Distended and tympanitic  Will attempt something different than Zofran.  Patient almost requested a nasogastric tube as he had one at his last hospitalization that seemed to help considerably.  I left it up to him if he wanted a nasogastric tube or if he vomits again then that may be indicated.

## 2017-11-20 NOTE — Progress Notes (Signed)
Visited patient.  Small volume emesis after being seen this morning.  Abdomen remains distended and tympanitic but nontender Continue observation.  Would not recommend imaging studies as ileus is suspected.

## 2017-11-20 NOTE — Progress Notes (Signed)
Pt was feeling bloated so he chose to have the NG tube placement. I gave him phenergan to help with nausea.

## 2017-11-20 NOTE — Progress Notes (Signed)
Strongly encouraged patient to walk out in hall to stimulate bowel function; acknowledged; sitting up at bedside and vomitted; BS hypoactive with occasional "tinkling"; BS positive in all quadrants except LUQ; states that he is passing "a little" gas, but is "burping" a lot. Abdomen still distended. Barbaraann Faster, RN 3:21 AM 11/20/2017

## 2017-11-21 ENCOUNTER — Inpatient Hospital Stay: Payer: PPO

## 2017-11-21 LAB — CBC WITH DIFFERENTIAL/PLATELET
BASOS ABS: 0 10*3/uL (ref 0–0.1)
BASOS PCT: 0 %
EOS PCT: 2 %
Eosinophils Absolute: 0.1 10*3/uL (ref 0–0.7)
HCT: 35.5 % — ABNORMAL LOW (ref 40.0–52.0)
Hemoglobin: 12.1 g/dL — ABNORMAL LOW (ref 13.0–18.0)
Lymphocytes Relative: 15 %
Lymphs Abs: 1.1 10*3/uL (ref 1.0–3.6)
MCH: 29 pg (ref 26.0–34.0)
MCHC: 34.2 g/dL (ref 32.0–36.0)
MCV: 84.8 fL (ref 80.0–100.0)
MONO ABS: 0.7 10*3/uL (ref 0.2–1.0)
Monocytes Relative: 9 %
Neutro Abs: 5.2 10*3/uL (ref 1.4–6.5)
Neutrophils Relative %: 74 %
PLATELETS: 174 10*3/uL (ref 150–440)
RBC: 4.19 MIL/uL — ABNORMAL LOW (ref 4.40–5.90)
RDW: 14.9 % — AB (ref 11.5–14.5)
WBC: 7.1 10*3/uL (ref 3.8–10.6)

## 2017-11-21 LAB — BASIC METABOLIC PANEL
ANION GAP: 7 (ref 5–15)
BUN: 16 mg/dL (ref 6–20)
CALCIUM: 8.1 mg/dL — AB (ref 8.9–10.3)
CO2: 29 mmol/L (ref 22–32)
Chloride: 103 mmol/L (ref 101–111)
Creatinine, Ser: 0.68 mg/dL (ref 0.61–1.24)
Glucose, Bld: 123 mg/dL — ABNORMAL HIGH (ref 65–99)
Potassium: 3.6 mmol/L (ref 3.5–5.1)
Sodium: 139 mmol/L (ref 135–145)

## 2017-11-21 NOTE — Progress Notes (Signed)
4 Days Post-Op  Subjective: Patient is postop day 4 from a colostomy closure.  He had nausea and small volume emesis with bloating yesterday and shows to have a nasogastric tube placed last night.  He feels better today but has not passed any gas yet.  His abdominal pain is minimal if any.  Objective: Vital signs in last 24 hours: Temp:  [98 F (36.7 C)-98.6 F (37 C)] 98.4 F (36.9 C) (02/23 0424) Pulse Rate:  [76-99] 76 (02/23 0424) Resp:  [12-22] 16 (02/23 0428) BP: (121-129)/(72-79) 122/75 (02/23 0424) SpO2:  [95 %-96 %] 95 % (02/23 0428) FiO2 (%):  [94 %] 94 % (02/23 0003) Last BM Date: 11/17/17  Intake/Output from previous day: 02/22 0701 - 02/23 0700 In: 1879.7 [I.V.:1879.7] Out: 2665 [Urine:725; Emesis/NG output:1940] Intake/Output this shift: Total I/O In: -  Out: 100 [Urine:100]  Physical exam:  Vital signs reviewed and stable abdomen is distended and tympanitic wounds are clean with Penrose drains in place no erythema.  Nontender calves.  Lab Results: CBC  Recent Labs    11/21/17 0418  WBC 7.1  HGB 12.1*  HCT 35.5*  PLT 174   BMET Recent Labs    11/21/17 0418  NA 139  K 3.6  CL 103  CO2 29  GLUCOSE 123*  BUN 16  CREATININE 0.68  CALCIUM 8.1*   PT/INR No results for input(s): LABPROT, INR in the last 72 hours. ABG No results for input(s): PHART, HCO3 in the last 72 hours.  Invalid input(s): PCO2, PO2  Studies/Results: Dg Abd 1 View  Result Date: 11/20/2017 CLINICAL DATA:  Nasogastric tube placement EXAM: ABDOMEN - 1 VIEW COMPARISON:  07/07/2017 FINDINGS: Nasogastric tube extends into the gastric fundus. There is gaseous distention of the stomach. Multiple gas dilated small bowel loops in the visualized upper abdomen. The right lateral abdomen and lower abdomen are excluded. IMPRESSION: 1. Nasogastric tube extends into gas distended stomach. 2. Dilated small bowel in the visualized abdomen, suggesting ileus or distal obstruction. Electronically  Signed   By: Lucrezia Europe M.D.   On: 11/20/2017 18:46    Anti-infectives: Anti-infectives (From admission, onward)   Start     Dose/Rate Route Frequency Ordered Stop   11/17/17 0600  ertapenem (INVANZ) 1 g in sodium chloride 0.9 % 100 mL IVPB     1 g 200 mL/hr over 30 Minutes Intravenous On call to O.R. 11/16/17 2155 11/17/17 2025      Assessment/Plan: s/p Procedure(s): COLOSTOMY CLOSURE   Normal white blood cell count.  KUB is currently pending but the KUB from nasogastric tube placement was viewed and showed dilated loops.  We will review the acute abdominal series with upright etc.  This represents an ileus following his colostomy closure.  We will continue to observe.  Florene Glen, MD, FACS  11/21/2017

## 2017-11-22 ENCOUNTER — Inpatient Hospital Stay: Payer: PPO

## 2017-11-22 NOTE — Progress Notes (Signed)
5 Days Post-Op  Subjective: Status post colostomy closure.  He is feeling better today but has still had some nausea.  He has passed some gas.  His pain is minimal.  Objective: Vital signs in last 24 hours: Temp:  [97.9 F (36.6 C)-98.1 F (36.7 C)] 98 F (36.7 C) (02/24 0517) Pulse Rate:  [79-85] 83 (02/24 0517) Resp:  [11-18] 11 (02/24 0800) BP: (110-125)/(74-85) 124/85 (02/24 0517) SpO2:  [94 %-97 %] 96 % (02/24 0800) FiO2 (%):  [94 %] 94 % (02/24 0408) Last BM Date: 11/17/17  Intake/Output from previous day: 02/23 0701 - 02/24 0700 In: 2195.1 [I.V.:2195.1] Out: 1550 [Urine:650; Emesis/NG output:900] Intake/Output this shift: No intake/output data recorded.  Physical exam:  Vital signs are stable awake alert and oriented abdomen is soft but distended tympanitic nontender wound is clean with Penrose is in place  Lab Results: CBC  Recent Labs    11/21/17 0418  WBC 7.1  HGB 12.1*  HCT 35.5*  PLT 174   BMET Recent Labs    11/21/17 0418  NA 139  K 3.6  CL 103  CO2 29  GLUCOSE 123*  BUN 16  CREATININE 0.68  CALCIUM 8.1*   PT/INR No results for input(s): LABPROT, INR in the last 72 hours. ABG No results for input(s): PHART, HCO3 in the last 72 hours.  Invalid input(s): PCO2, PO2  Studies/Results: Dg Abd 1 View  Result Date: 11/20/2017 CLINICAL DATA:  Nasogastric tube placement EXAM: ABDOMEN - 1 VIEW COMPARISON:  07/07/2017 FINDINGS: Nasogastric tube extends into the gastric fundus. There is gaseous distention of the stomach. Multiple gas dilated small bowel loops in the visualized upper abdomen. The right lateral abdomen and lower abdomen are excluded. IMPRESSION: 1. Nasogastric tube extends into gas distended stomach. 2. Dilated small bowel in the visualized abdomen, suggesting ileus or distal obstruction. Electronically Signed   By: Lucrezia Europe M.D.   On: 11/20/2017 18:46   Dg Abd 2 Views  Result Date: 11/21/2017 CLINICAL DATA:  Ileus. NG tube placement.  Recent colostomy closure. EXAM: ABDOMEN - 2 VIEW COMPARISON:  11/20/2017 FINDINGS: Read demonstrated marked gas distention of multiple loops of small bowel with conspicuous paucity of distal colonic gas. Index loop of small bowel within the left upper abdominal quadrant measuring 4 cm in diameter. No supine evidence of pneumoperitoneum. No pneumatosis or portal venous gas. Enteric tube tip and side port project over the expected location of the gastric fundus No definitive intra-abdominal calcifications. Limited visualization of the lower thorax demonstrates sequela of prior median sternotomy and CABG. Left basilar opacities favored to represent atelectasis. Mild to moderate multilevel lumbar spine DDD is suspected though incompletely evaluated. Stigmata of DISH within the thoracic spine. Multiple skin staples overlie the right lower abdominal quadrant. Suspected surgical drain. IMPRESSION: Findings worrisome for small bowel obstruction. Electronically Signed   By: Sandi Mariscal M.D.   On: 11/21/2017 09:11    Anti-infectives: Anti-infectives (From admission, onward)   Start     Dose/Rate Route Frequency Ordered Stop   11/17/17 0600  ertapenem (INVANZ) 1 g in sodium chloride 0.9 % 100 mL IVPB     1 g 200 mL/hr over 30 Minutes Intravenous On call to O.R. 11/16/17 2155 11/17/17 2025      Assessment/Plan: s/p Procedure(s): COLOSTOMY CLOSURE   Patient has experienced postoperative ileus.  This seems to be improving today.  But I am hesitant to remove his nasogastric tube as it had to be placed due to nausea.  Will  come back and see him this afternoon and potentially remove the nasogastric tube or at least give him clear liquids around it.  Florene Glen, MD, FACS  11/22/2017

## 2017-11-22 NOTE — Progress Notes (Signed)
Patient is not feeling much different right now he is not passed much more gas.  We will continue with nasogastric tube in place for now.

## 2017-11-22 NOTE — Progress Notes (Signed)
Advanced NG tube 12 inches per Dr. Burt Knack order.

## 2017-11-23 LAB — BASIC METABOLIC PANEL
Anion gap: 6 (ref 5–15)
BUN: 12 mg/dL (ref 6–20)
CHLORIDE: 100 mmol/L — AB (ref 101–111)
CO2: 26 mmol/L (ref 22–32)
CREATININE: 0.73 mg/dL (ref 0.61–1.24)
Calcium: 7.9 mg/dL — ABNORMAL LOW (ref 8.9–10.3)
GFR calc Af Amer: >60 mL/min (ref >60)
GFR calc non Af Amer: >60 mL/min (ref >60)
GLUCOSE: 112 mg/dL — AB (ref 65–99)
Potassium: 3.3 mmol/L — ABNORMAL LOW (ref 3.5–5.1)
SODIUM: 132 mmol/L — AB (ref 135–145)

## 2017-11-23 LAB — MAGNESIUM: MAGNESIUM: 1.9 mg/dL (ref 1.7–2.4)

## 2017-11-23 LAB — PHOSPHORUS: Phosphorus: 2.6 mg/dL (ref 2.5–4.6)

## 2017-11-23 MED ORDER — PHENOL 1.4 % MT LIQD
1.0000 | OROMUCOSAL | Status: DC | PRN
  Administered 2017-11-23: 1 via OROMUCOSAL
  Filled 2017-11-23: qty 177

## 2017-11-23 MED ORDER — KETOROLAC TROMETHAMINE 15 MG/ML IJ SOLN: 15.0000 mg | Freq: Four times a day (QID) | INTRAMUSCULAR | Status: DC | PRN

## 2017-11-23 MED ORDER — KCL IN DEXTROSE-NACL 20-5-0.45 MEQ/L-%-% IV SOLN
INTRAVENOUS | Status: DC
  Administered 2017-11-23 – 2017-11-24 (×3): via INTRAVENOUS
  Filled 2017-11-23 (×4): qty 1000

## 2017-11-23 MED ORDER — HYDROMORPHONE HCL 1 MG/ML IJ SOLN: 0.5000 mg | INTRAMUSCULAR | Status: DC | PRN

## 2017-11-23 NOTE — Progress Notes (Signed)
11/23/2017  Subjective: Patient is 6 Days Post-Op s/p colostomy closure.  No acute events overnight.  Patient reports passing some flatus last night and this morning and feels hungry.  Pain controlled.  Vital signs: Temp:  [97.8 F (36.6 C)-99.7 F (37.6 C)] 98.1 F (36.7 C) (02/25 0555) Pulse Rate:  [73-87] 75 (02/25 0555) Resp:  [10-18] 15 (02/25 0825) BP: (131-145)/(78-87) 145/83 (02/25 0555) SpO2:  [93 %-99 %] 96 % (02/25 0825) FiO2 (%):  [0 %] 0 % (02/25 0452) Weight:  [103.1 kg (227 lb 4.8 oz)] 103.1 kg (227 lb 4.8 oz) (02/25 1155)   Intake/Output: 02/24 0701 - 02/25 0700 In: 2099.3 [I.V.:2099.3] Out: 2000 [Urine:800; Emesis/NG output:1200] Last BM Date: 11/17/17  Physical Exam: Constitutional: No acute distress Abdomen:  Soft, mildly distended, appropriately tender to palpation.  Midline incision with staples and penrose drain with some serosanguinous drainage at the inferior portion.  NG tube in place with clear gastric contents  Labs:  Recent Labs    11/21/17 0418  WBC 7.1  HGB 12.1*  HCT 35.5*  PLT 174   Recent Labs    11/21/17 0418 11/23/17 0803  NA 139 132*  K 3.6 3.3*  CL 103 100*  CO2 29 26  GLUCOSE 123* 112*  BUN 16 12  CREATININE 0.68 0.73  CALCIUM 8.1* 7.9*   No results for input(s): LABPROT, INR in the last 72 hours.  Imaging: Dg Abd Portable 1v  Result Date: 11/22/2017 CLINICAL DATA:  NG tube placement EXAM: PORTABLE ABDOMEN - 1 VIEW COMPARISON:  11/22/2017 FINDINGS: In NG tube tip is seen in the fundus of the stomach. IMPRESSION: NG tube tip in the fundus of the stomach. Electronically Signed   By: Rolm Baptise M.D.   On: 11/22/2017 20:38   Dg Abd Portable 1v  Result Date: 11/22/2017 CLINICAL DATA:  NG tube placement EXAM: PORTABLE ABDOMEN - 1 VIEW COMPARISON:  11/21/2017 FINDINGS: No NG tube is visualized in the lower chest or abdomen. Gaseous distention of bowel again noted, unchanged. IMPRESSION: No visible NG tube in the lower chest or  abdomen. Electronically Signed   By: Rolm Baptise M.D.   On: 11/22/2017 20:00    Assessment/Plan: 72 yo male s/p colostomy closure  --will do NG clamp trial today.  If residual < 150 ml, will d/c NG tube and start clears tonight.   --OOB, ambulate --abdominal binder for comfort. --will d/c PCA and start prn IV dilaudid and toradol --mild hypokalemia, will replete with IV fluids   Melvyn Neth, [place]

## 2017-11-23 NOTE — Progress Notes (Signed)
Chaplain followed up with patient and wife and continued spiritual and emotional support.

## 2017-11-23 NOTE — Progress Notes (Signed)
Reconnected pt to suction at 1215. Pt put out 75 mL from NG tube over 30. NG tube removed per order.

## 2017-11-23 NOTE — Progress Notes (Signed)
Initial Nutrition Assessment  DOCUMENTATION CODES:   Obesity unspecified  INTERVENTION:   Recommend TPN if unable to advance diet in 1-2 days  Monitor K, Mg, and P labs as pt at refeeding risk  NUTRITION DIAGNOSIS:   Inadequate oral intake related to acute illness as evidenced by NPO status.  GOAL:   Patient will meet greater than or equal to 90% of their needs  MONITOR:   Diet advancement, Labs, Weight trends, I & O's  REASON FOR ASSESSMENT:   NPO/Clear Liquid Diet    ASSESSMENT:   72 y.o. 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 Pt admitted for colostomy closure 2/19 now with post op ileus   Met with pt in room today. RD familiar with this pt from previous admit in October 2018 when pt was admitted for perforated diverticulitis and Hartmann's. Pt required TPN during that admit. Pt reports that he has been doing well since that admit; eating well and stable weights. Pt admitted for colostomy closure and developed ileus now on NPO/liquid diet since admit. Pt reports his last solid meal was on 2/16. Pt with NGT in place; currently clamped. Pt reports that today, he is feeling better, passing flatus, and feels hungry. Pt would like to have his diet advanced to clear liquids. Recommend TPN if unable to advance diet in 1-2 days. Recommend monitor K, Mg, and P as pt likely at high refeeding risk. Pt would like to have chocolate Premier Protein when diet advanced.      Medications reviewed and include: aldactone, LRS w/ 5% dextrose '@100ml'$ /hr  Labs reviewed: Na 132(L), K 3.3(L), Cl 100(L), Ca 7.9(L), Mg 1.9 wnl cbgs- 123, 122 x 48 hrs  Nutrition-Focused physical exam completed. Findings are no fat depletion, no muscle depletion, and no  edema.   Diet Order:  Diet NPO time specified Except for: Sips with Meds  EDUCATION NEEDS:   Education needs have been addressed  Skin:  Skin Assessment: (abdominal incision )  Last BM:  2/19  Height:   Ht Readings from Last 1 Encounters:  11/17/17 '5\' 9"'$  (1.753 m)    Weight:   Wt Readings from Last 1 Encounters:  11/23/17 227 lb 4.8 oz (103.1 kg)    Ideal Body Weight:  64.54 kg  BMI:  Body mass index is 33.57 kg/m.  Estimated Nutritional Needs:   Kcal:  2000-2300kcal/day   Protein:  103-114g/day   Fluid:  >2L/day   Koleen Distance MS, RD, LDN Pager #[phone] After Hours Pager: [phone]

## 2017-11-24 ENCOUNTER — Telehealth: Payer: Self-pay

## 2017-11-24 MED ORDER — ENSURE ENLIVE PO LIQD
237.0000 mL | Freq: Two times a day (BID) | ORAL | Status: DC
  Administered 2017-11-24 – 2017-11-25 (×2): 237 mL via ORAL

## 2017-11-24 MED ORDER — OXYCODONE HCL 5 MG PO TABS: 5.0000 mg | ORAL_TABLET | ORAL | Status: DC | PRN

## 2017-11-24 NOTE — Care Management Important Message (Signed)
Important Message  Patient Details  Name: Ryan Mccall MRN: [id number] Date of Birth: 08-04-1946   Medicare Important Message Given:  Yes    Beverly Sessions, RN 11/24/2017, 4:26 PM

## 2017-11-24 NOTE — Telephone Encounter (Signed)
Patient's disability form was filled out and faxed to [place] at [phone] and [phone]

## 2017-11-24 NOTE — Progress Notes (Signed)
11/24/2017  Subjective: Patient is 7 Days Post-Op s/p colostomy closure.  Patient regained bowel function and passed NG clamp trial.  NG was removed and was started on clears yesterday.  He had a bowel movement overnight.    Vital signs: Temp:  [97.7 F (36.5 C)-98.6 F (37 C)] 97.7 F (36.5 C) (02/26 0425) Pulse Rate:  [65-82] 73 (02/26 0725) Resp:  [14-18] 17 (02/26 0725) BP: (120-131)/(74-90) 128/90 (02/26 0725) SpO2:  [96 %-100 %] 96 % (02/26 0425) Weight:  [103.1 kg (227 lb 4.8 oz)] 103.1 kg (227 lb 4.8 oz) (02/25 1155)   Intake/Output: 02/25 0701 - 02/26 0700 In: 1660.2 [P.O.:360; I.V.:1300.2] Out: 1875 [Urine:1700; Emesis/NG output:175] Last BM Date: 11/24/17  Physical Exam: Constitutional: No acute distress Abdomen:  Soft, nondistended, appropriately tender to palpation.  Midline incision is clean, with staples, with some serosanguinous drainage at the inferior portion of the penrose drain.  Prior ostomy site incision is clean, dry, intact.  No evidence of infection and all staples in place.  Labs:  No results for input(s): WBC, HGB, HCT, PLT in the last 72 hours. Recent Labs    11/23/17 0803  NA 132*  K 3.3*  CL 100*  CO2 26  GLUCOSE 112*  BUN 12  CREATININE 0.73  CALCIUM 7.9*   No results for input(s): LABPROT, INR in the last 72 hours.  Imaging: No results found.  Assessment/Plan: 72 yo male s/p colostomy closure.  --will advance diet to full liquids for lunch today. --d/c IV fluids --continue dressing changes to midline incision as needed.  Penrose drain will remain in place and be removed in office. --Possible discharge to home tomorrow depending on progress.   Melvyn Neth, [place]

## 2017-11-25 LAB — PHOSPHORUS: Phosphorus: 3.4 mg/dL (ref 2.5–4.6)

## 2017-11-25 LAB — POTASSIUM: POTASSIUM: 3.5 mmol/L (ref 3.5–5.1)

## 2017-11-25 LAB — MAGNESIUM: Magnesium: 1.9 mg/dL (ref 1.7–2.4)

## 2017-11-25 MED ORDER — ENSURE ENLIVE PO LIQD: 237.0000 mL | Freq: Two times a day (BID) | ORAL | 12 refills | Status: AC

## 2017-11-25 MED ORDER — OXYCODONE HCL 5 MG PO TABS: 5.0000 mg | ORAL_TABLET | ORAL | 0 refills | Status: DC | PRN

## 2017-11-25 NOTE — Discharge Summary (Signed)
Patient ID: Ryan Mccall MRN: [id number] DOB/AGE: [birth year] 71 y.o.  Admit date: 11/17/2017 Discharge date: 11/25/2017   Discharge Diagnoses:  Active Problems:   Diverticulosis   Procedures:  Colostomy takedown  Hospital Course:  Patient was admitted on 2/19 for elective colostomy closure, following Hartmann's procedure for perforated diverticulitis on 07/03/17.  He tolerated the procedure well.  Post-operatively, he had a post-op ileus which required NG tube placement.  He eventually regained bowel function and NG tube was removed.  His diet was advanced slowly and he was tolerating well.  He was voiding, having bowel function, without any nausea or vomiting, and his pain was well controlled.  He was deemed ready for discharge.  On exam, he was in no acute distress with stable vital signs.  His abdomen was soft, obese, non-distended, and non-tender to palpation.  His midline incision was clean with staples in place and penrose drain, with mild opening of the skin between staples over the inferior portion of the wound, with some serosanguinous fluid on gauze.  Left colostomy site incision was clean, dry, intact with staples in place and penrose drain.  Of note, he reports he still has some pain medication from his prior surgery and has asked not to get any new prescriptions at this time.  Consults:  None   Disposition: 01-Home or Self Care  Discharge Instructions    Call MD for:  difficulty breathing, headache or visual disturbances   Complete by:  As directed    Call MD for:  persistant nausea and vomiting   Complete by:  As directed    Call MD for:  redness, tenderness, or signs of infection (pain, swelling, redness, odor or green/yellow discharge around incision site)   Complete by:  As directed    Call MD for:  severe uncontrolled pain   Complete by:  As directed    Call MD for:  temperature >100.4   Complete by:  As directed    Change dressing (specify)   Complete by:  As  directed    Dressing:  Dry gauze or ABD pad over midline and left incision.  Change once daily or as needed to keep the wounds clean and dry.  Secure with tape.   Diet - low sodium heart healthy   Complete by:  As directed    Discharge instructions   Complete by:  As directed    1.  Patient may shower, but do not scrub wound heavily and dab dry only. 2.  Do not submerge wounds in pool/tub 3.  Do not remove staples or drains. 4.  May wear abdominal binder as much as possible for comfort and support of midline incision.   Driving Restrictions   Complete by:  As directed    Do not drive while taking narcotics for pain control.   Increase activity slowly   Complete by:  As directed    Lifting restrictions   Complete by:  As directed    No heavy lifting or pushing of more than 10-15 lbs for 4 weeks.     Allergies as of 11/25/2017      Reactions   Shellfish Allergy Nausea And Vomiting      Medication List    TAKE these medications   carvedilol 12.5 MG tablet Commonly known as:  COREG Take 1.5 tablets by mouth twice a day   COD LIVER OIL PO Take 1 capsule by mouth 3 (three) times a week.   diclofenac sodium 1 % Gel  Commonly known as:  VOLTAREN Apply 1 application topically daily as needed (joint pain).   ezetimibe 10 MG tablet Commonly known as:  ZETIA Take 10 mg by mouth at bedtime.   feeding supplement (ENSURE ENLIVE) Liqd Take 237 mLs by mouth 2 (two) times daily between meals.   fexofenadine 180 MG tablet Commonly known as:  ALLEGRA Take 180 mg by mouth daily as needed for allergies.   fish oil-omega-3 fatty acids 1000 MG capsule Take 1 g by mouth 3 (three) times a week.   naproxen sodium 220 MG tablet Commonly known as:  ALEVE Take 220 mg by mouth daily as needed (pain).   nitroGLYCERIN 0.4 MG SL tablet Commonly known as:  NITROSTAT Place 0.4 mg under the tongue as needed for chest pain. May repeat in 15 minutes up to 3 times for chest pain.   oxyCODONE 5 MG  immediate release tablet Commonly known as:  Oxy IR/ROXICODONE Take 1-2 tablets (5-10 mg total) by mouth every 4 (four) hours as needed for moderate pain or severe pain.   rivaroxaban 20 MG Tabs tablet Commonly known as:  XARELTO Take 1 tablet (20 mg total) by mouth daily with supper.   simvastatin 10 MG tablet Commonly known as:  ZOCOR Take 10 mg by mouth at bedtime.   spironolactone 25 MG tablet Commonly known as:  ALDACTONE Take 0.5 tablets (12.5 mg total) by mouth daily.   UNABLE TO FIND 1 tablet as needed. Med Name: cardio-plus            Discharge Care Instructions  (From admission, onward)        Start     Ordered   11/25/17 0000  Change dressing (specify)    Comments:  Dressing:  Dry gauze or ABD pad over midline and left incision.  Change once daily or as needed to keep the wounds clean and dry.  Secure with tape.   11/25/17 1005     Follow-up Information    Olean Ree, MD Follow up on 12/03/2017.   Specialty:  Surgery Why:  Appointment on 12/03/17 at 1:30 pm.  Please arrive a few minutes beforehand to check in. Contact information: [place] [address] [phone]

## 2017-11-25 NOTE — Progress Notes (Signed)
Ryan Mccall  A and O x 4. VSS. Pt tolerating diet well. No complaints of pain or nausea. IV removed intact, prescriptions given. Teach pt how to do his dressing changes and gave him supplies to do his first dressing changes. Pt voiced understanding of discharge instructions with no further questions. Pt discharged via wheelchair with axillary.    Allergies as of 11/25/2017      Reactions   Shellfish Allergy Nausea And Vomiting      Medication List    TAKE these medications   carvedilol 12.5 MG tablet Commonly known as:  COREG Take 1.5 tablets by mouth twice a day   COD LIVER OIL PO Take 1 capsule by mouth 3 (three) times a week.   diclofenac sodium 1 % Gel Commonly known as:  VOLTAREN Apply 1 application topically daily as needed (joint pain).   ezetimibe 10 MG tablet Commonly known as:  ZETIA Take 10 mg by mouth at bedtime.   feeding supplement (ENSURE ENLIVE) Liqd Take 237 mLs by mouth 2 (two) times daily between meals.   fexofenadine 180 MG tablet Commonly known as:  ALLEGRA Take 180 mg by mouth daily as needed for allergies.   fish oil-omega-3 fatty acids 1000 MG capsule Take 1 g by mouth 3 (three) times a week.   naproxen sodium 220 MG tablet Commonly known as:  ALEVE Take 220 mg by mouth daily as needed (pain).   nitroGLYCERIN 0.4 MG SL tablet Commonly known as:  NITROSTAT Place 0.4 mg under the tongue as needed for chest pain. May repeat in 15 minutes up to 3 times for chest pain.   oxyCODONE 5 MG immediate release tablet Commonly known as:  Oxy IR/ROXICODONE Take 1-2 tablets (5-10 mg total) by mouth every 4 (four) hours as needed for moderate pain or severe pain.   rivaroxaban 20 MG Tabs tablet Commonly known as:  XARELTO Take 1 tablet (20 mg total) by mouth daily with supper.   simvastatin 10 MG tablet Commonly known as:  ZOCOR Take 10 mg by mouth at bedtime.   spironolactone 25 MG tablet Commonly known as:  ALDACTONE Take 0.5 tablets (12.5 mg  total) by mouth daily.   UNABLE TO FIND 1 tablet as needed. Med Name: cardio-plus            Discharge Care Instructions  (From admission, onward)        Start     Ordered   11/25/17 0000  Change dressing (specify)    Comments:  Dressing:  Dry gauze or ABD pad over midline and left incision.  Change once daily or as needed to keep the wounds clean and dry.  Secure with tape.   11/25/17 1005      Vitals:   11/25/17 0601 11/25/17 0720  BP: 118/78 116/73  Pulse: 77 70  Resp: 20   Temp: 97.8 F (36.6 C)   SpO2: 98%     Francesco Sor

## 2017-11-30 ENCOUNTER — Telehealth: Payer: Self-pay

## 2017-11-30 NOTE — Telephone Encounter (Signed)
Patient's wife called at this time and verbalized that his penrose drain has shifted and she wanted to know if that was okay? She stated the drain was still in place however it had moved some. She denied any change in drainage, fever/chills, redness around the incision, and pain.

## 2017-12-03 ENCOUNTER — Ambulatory Visit (INDEPENDENT_AMBULATORY_CARE_PROVIDER_SITE_OTHER): Payer: PPO | Admitting: Surgery

## 2017-12-03 ENCOUNTER — Encounter: Payer: Self-pay | Admitting: Surgery

## 2017-12-03 VITALS — BP 110/75 | HR 89 | Temp 97.7°F | Ht 69.0 in | Wt 219.2 lb

## 2017-12-03 DIAGNOSIS — Z09 Encounter for follow-up examination after completed treatment for conditions other than malignant neoplasm: Secondary | ICD-10-CM

## 2017-12-03 NOTE — Patient Instructions (Signed)
We have removed some of the staples today and placed steri strips. We also removed the drains. You may see an increase in drainage.  Please pack the wounds and cover with a dry dressing.  You may shower as usual however do not scrub over the steri strips.   Please see your follow up appointment listed below.

## 2017-12-03 NOTE — Progress Notes (Signed)
12/03/2017  HPI: Patient is s/p colostomy takedown with Dr. Burt Knack on 2/19.  He had a post-op ileus and was discharged in good condition on 2/27.  He presents today for his first follow up.  He has a penrose drain over midline incision and his prior colostomy site.  He reports that he's been fatigued and tired but otherwise doing ok.  He's eating and ambulating.  Pain is controlled.  Did notice that the midline penrose drain moved and slipped superiorly, with a new wound opening over the mid portion of the midline incision.    Vital signs: BP 110/75   Pulse 89   Temp 97.7 F (36.5 C) (Oral)   Ht 5\' 9"  (1.753 m)   Wt 99.4 kg (219 lb 3.2 oz)   BMI 32.37 kg/m    Physical Exam: Constitutional:  No acute distress Abdomen:  Soft, obese, nondistended, nontender to palpation.  Midline incision with penrose drain, which comes out superiorly and just below the umbilicus through a new opening between staples.  This opening measures about 2-1/2 cm in diameter.  There is otherwise healthy tissue underneath with no evidence of infection at this point.  The ostomy site incision looks like also the skin has separated from the staples for about 80% of the length of the wound, about 1 cm width and depth.  Both Penrose drains were removed without complications.  Silver nitrate was applied over the superior and inferior portion of the midline incision otherwise dry gauze was placed to partially packed the opening of the skin at the ostomy site as well as that 2-1/2 cm opening in the midline.  Intermittent staples were removed and replaced with Steri-Strips over the midline incision.  The ostomy incision if the staples were removed completely as they were not helping approximate the skin edges anymore.  Assessment/Plan: 72 year old male status post colostomy takedown.  Patient will follow-up next week with Dr. Burt Knack for another wound evaluation and to remove the remaining staples are in place.  Discussed with the  patient and his wife that the 2 areas that have open should be lightly packed with gauze and cover with dry gauze and tape.  The areas on the midline incision with the Penrose drain was initially placed were cauterized with silver nitrate to help with scarring and healing.  Patient tolerated this well.  Patient still has a no heavy lifting or pushing restriction of nothing more than 10-15 pounds for another 3 weeks.  Patient was encouraged to continue his diet and activity per restrictions and reassured that his energy level will continue to improve.   Melvyn Neth, [place]

## 2017-12-09 ENCOUNTER — Encounter: Payer: Self-pay | Admitting: Surgery

## 2017-12-09 ENCOUNTER — Ambulatory Visit (INDEPENDENT_AMBULATORY_CARE_PROVIDER_SITE_OTHER): Payer: PPO | Admitting: Surgery

## 2017-12-09 VITALS — BP 135/97 | HR 80 | Temp 97.5°F | Ht 69.0 in | Wt 215.8 lb

## 2017-12-09 DIAGNOSIS — K5730 Diverticulosis of large intestine without perforation or abscess without bleeding: Secondary | ICD-10-CM

## 2017-12-09 NOTE — Progress Notes (Signed)
Outpatient postop visit  12/09/2017  Ryan Mccall is an 72 y.o. male.    Procedure: Colostomy closure  CC: Doing well  HPI: Patient status post colostomy closure.  He is here for postoperative wound check.  He has no complaints is eating well having normal bowel movements.  No fevers or chills  Medications reviewed.    Physical Exam:  BP (!) 135/97   Pulse 80   Temp (!) 97.5 F (36.4 C) (Oral)   Ht 5\' 9"  (1.753 m)   Wt 215 lb 12.8 oz (97.9 kg)   BMI 31.87 kg/m     PE: Wounds are clean no erythema granulation is present and in some areas between the staples there is more granulation where the Penrose drain had been.  Staples were removed Steri-Strips are removed as well.  Silver nitrate applied to the small areas of granulation tissue.    Assessment/Plan:  Patient is doing very well recommend follow-up in 2 weeks for repeat silver nitrate treatment if needed.  Florene Glen, MD, FACS

## 2017-12-09 NOTE — Patient Instructions (Signed)
We will see you back in office as listed below, please call our office if you have any questions or concerns.

## 2017-12-17 ENCOUNTER — Telehealth: Payer: Self-pay

## 2017-12-17 NOTE — Telephone Encounter (Signed)
Patient's disability form was updated and faxed to [place].

## 2017-12-30 ENCOUNTER — Encounter: Payer: Self-pay | Admitting: Surgery

## 2017-12-30 ENCOUNTER — Ambulatory Visit (INDEPENDENT_AMBULATORY_CARE_PROVIDER_SITE_OTHER): Payer: PPO | Admitting: Surgery

## 2017-12-30 VITALS — BP 127/74 | HR 69 | Temp 97.4°F | Ht 69.0 in | Wt 221.6 lb

## 2017-12-30 DIAGNOSIS — K5730 Diverticulosis of large intestine without perforation or abscess without bleeding: Secondary | ICD-10-CM

## 2017-12-30 NOTE — Progress Notes (Signed)
Outpatient postop visit  12/30/2017  Ryan Mccall is an 72 y.o. male.    Procedure: Colostomy closure  CC: Patient feels well.  HPI: This a patient status post colostomy closure.  His only problem at this point is frequent bowel movements which are small.  He is eating well having no nausea vomiting fevers or chills.  He drove himself to the office today and he is approximately 7 weeks out from surgery.  He does no heavy lifting at work.  He is a Librarian, academic.  Medications reviewed.    Physical Exam:  BP 127/74   Pulse 69   Temp (!) 97.4 F (36.3 C) (Oral)   Ht 5\' 9"  (1.753 m)   Wt 221 lb 9.6 oz (100.5 kg)   BMI 32.72 kg/m     PE: Wound is clean with no erythema no drainage small eschar is present.    Assessment/Plan:  Patient doing very well following colostomy closure for diverticulitis.  Patient is asking about additional disability but the patient is able to drive and he does no heavy lifting at work.  He is also outside of the typical 6-week restriction on lifting.  I think he can go back to work when he feels ready which may be Monday morning.  No restrictions at that time.  He will follow-up with me on an as-needed basis.  Florene Glen, MD, FACS

## 2017-12-30 NOTE — Patient Instructions (Signed)
Please call our office with any questions or concerns. 

## 2018-02-26 DIAGNOSIS — E782 Mixed hyperlipidemia: Secondary | ICD-10-CM | POA: Diagnosis not present

## 2018-02-26 DIAGNOSIS — I4891 Unspecified atrial fibrillation: Secondary | ICD-10-CM | POA: Diagnosis not present

## 2018-02-26 DIAGNOSIS — K5700 Diverticulitis of small intestine with perforation and abscess without bleeding: Secondary | ICD-10-CM | POA: Diagnosis not present

## 2018-02-26 DIAGNOSIS — I2510 Atherosclerotic heart disease of native coronary artery without angina pectoris: Secondary | ICD-10-CM | POA: Diagnosis not present

## 2018-02-26 DIAGNOSIS — R7303 Prediabetes: Secondary | ICD-10-CM | POA: Diagnosis not present

## 2018-02-26 DIAGNOSIS — I10 Essential (primary) hypertension: Secondary | ICD-10-CM | POA: Diagnosis not present

## 2018-04-27 ENCOUNTER — Telehealth: Payer: Self-pay | Admitting: Cardiology

## 2018-04-27 MED ORDER — RIVAROXABAN 20 MG PO TABS: 20.0000 mg | ORAL_TABLET | Freq: Every day | ORAL | 1 refills | Status: DC

## 2018-04-27 NOTE — Telephone Encounter (Signed)
New Message:        *STAT* If patient is at the pharmacy, call can be transferred to refill team.   1. Which medications need to be refilled? (please list name of each medication and dose if known) rivaroxaban (XARELTO) 20 MG TABS tablet  2. Which pharmacy/location (including street and city if local pharmacy) is medication to be sent to?[facility] - [place], [place]  3. Do they need a 30 day or 90 day supply? [place]

## 2018-04-29 DIAGNOSIS — S8012XA Contusion of left lower leg, initial encounter: Secondary | ICD-10-CM | POA: Diagnosis not present

## 2018-04-29 DIAGNOSIS — I80202 Phlebitis and thrombophlebitis of unspecified deep vessels of left lower extremity: Secondary | ICD-10-CM | POA: Diagnosis not present

## 2018-04-29 DIAGNOSIS — Z7901 Long term (current) use of anticoagulants: Secondary | ICD-10-CM | POA: Diagnosis not present

## 2018-04-30 DIAGNOSIS — I80202 Phlebitis and thrombophlebitis of unspecified deep vessels of left lower extremity: Secondary | ICD-10-CM | POA: Diagnosis not present

## 2018-04-30 DIAGNOSIS — R600 Localized edema: Secondary | ICD-10-CM | POA: Diagnosis not present

## 2018-05-11 IMAGING — US US RENAL
1 series · 14 of 25 positions shown · non-contrast
Comparison: Noncontrast abdominal CT scan July 01, 2017

CLINICAL DATA: Right renal mass

EXAM:
RENAL / URINARY TRACT ULTRASOUND COMPLETE

[Series 1: us renal · 0.29mm/px · 14 of 30 slices shown]
[im 1/30]
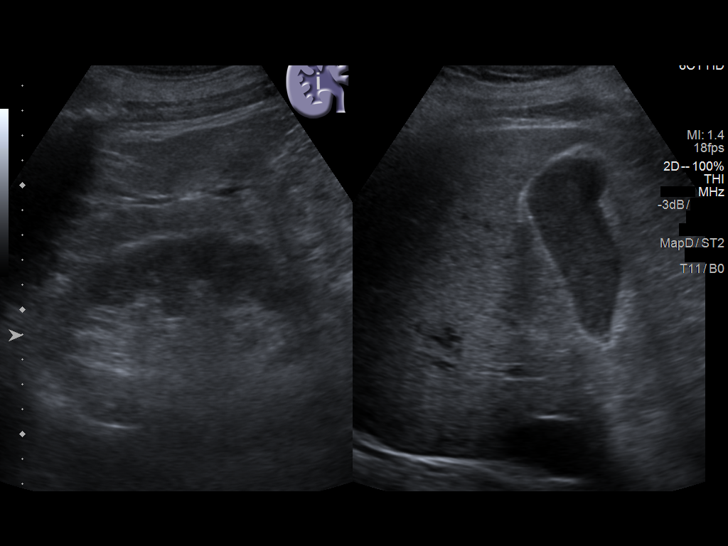
[im 3/30]
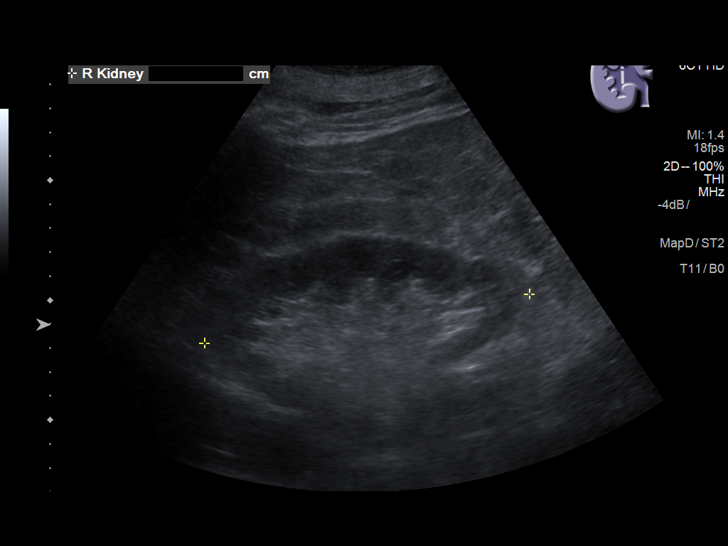
[im 5/30]
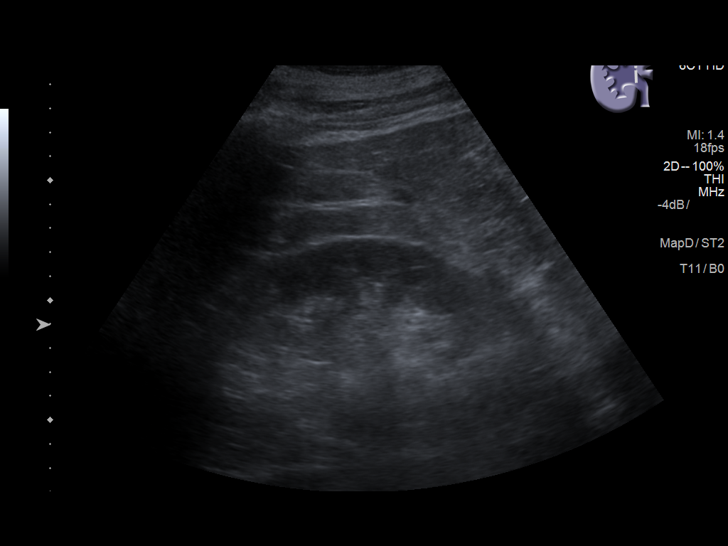
[im 8/30]
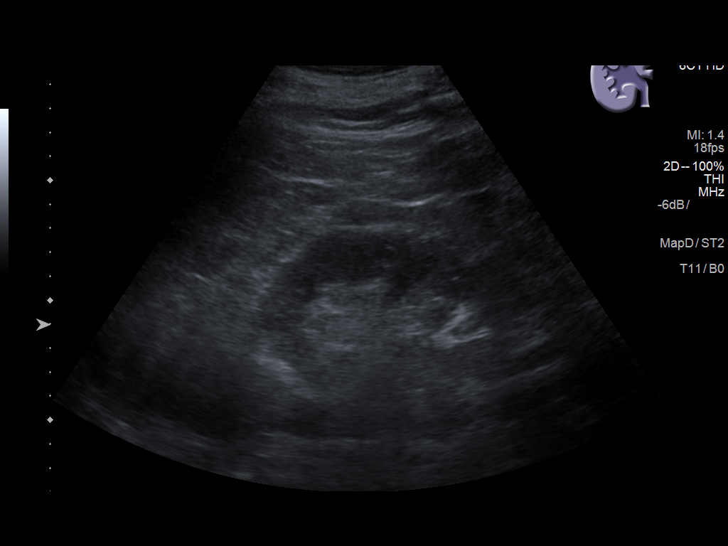
[im 10/30]
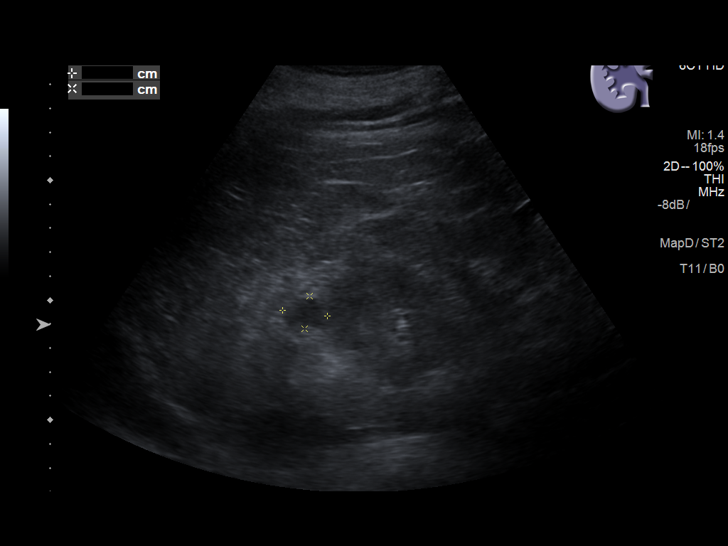
[im 11/30]
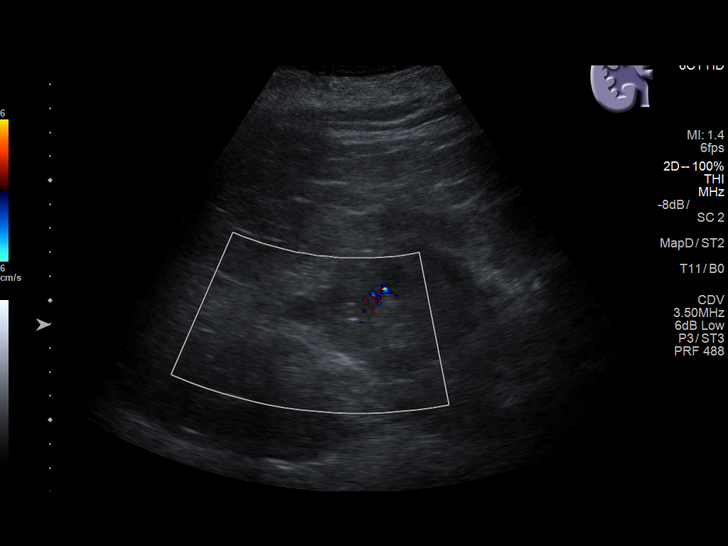
[im 14/30]
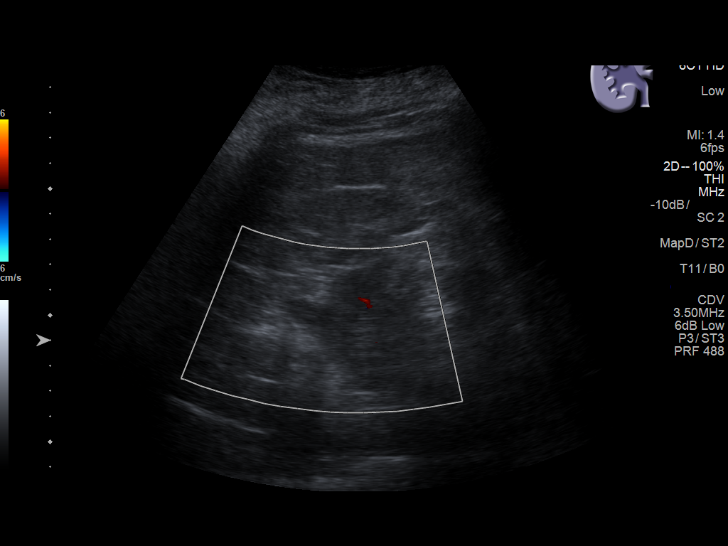
[im 16/30]
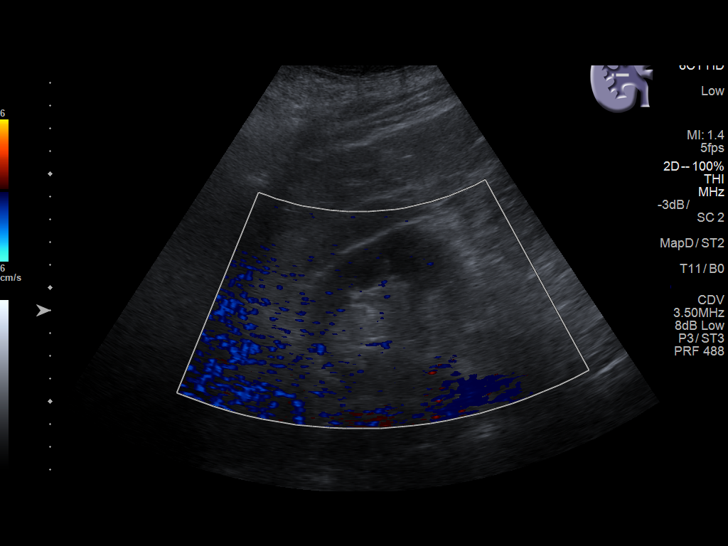
[im 19/30]
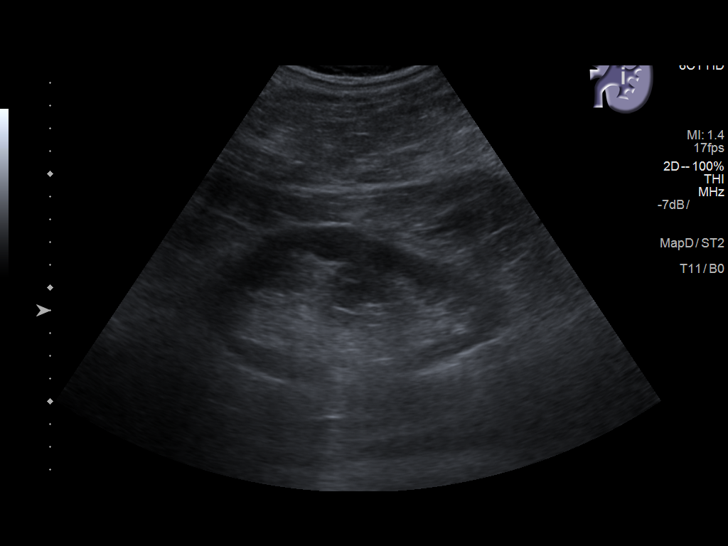
[im 20/30]
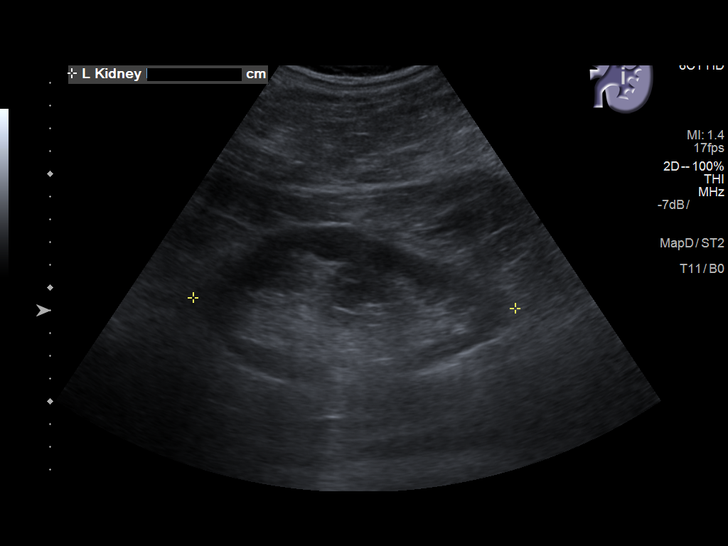
[im 22/30]
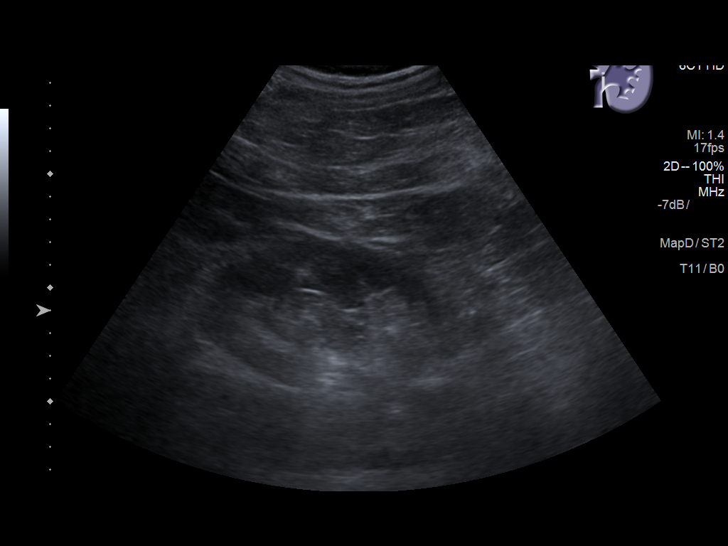
[im 25/30]
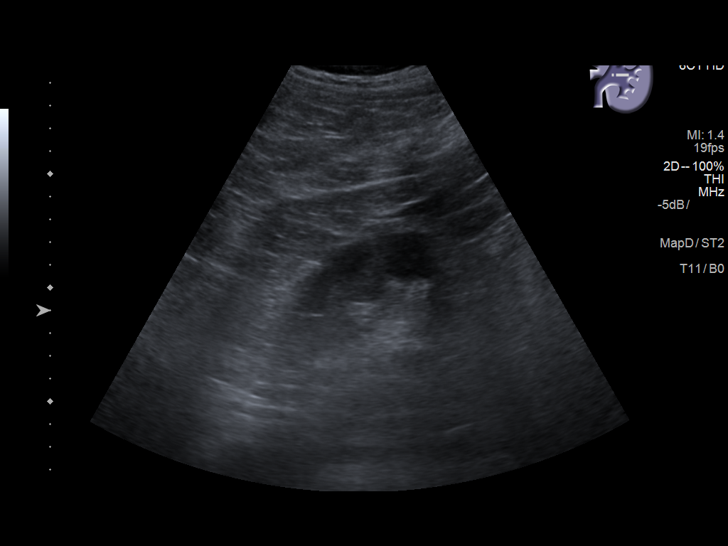
[im 27/30]
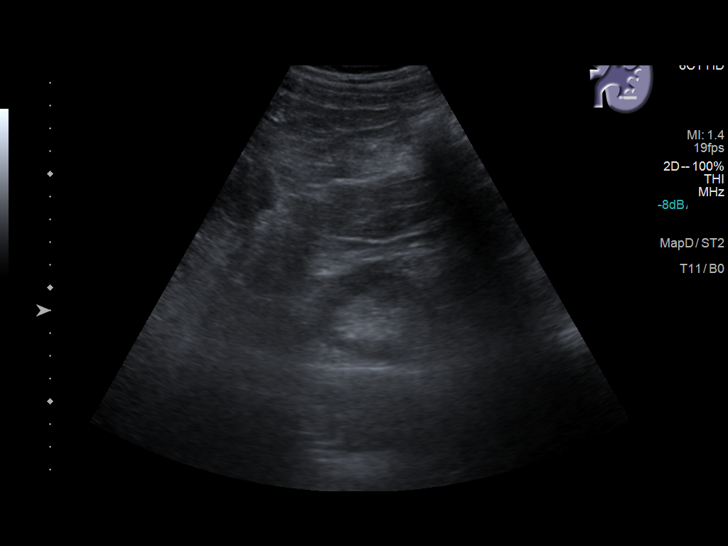
[im 30/30]
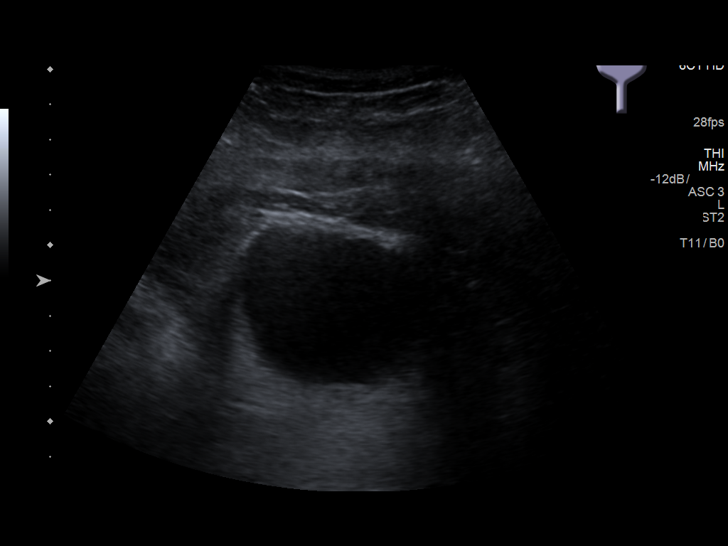

[14 of 25 positions shown; findings below may reference images not displayed]

FINDINGS: Right Kidney:

Length: 13.7 cm. The renal cortical echotexture remains lower than
that of the liver. There is a hypoechoic structure in the upper pole
measuring 1.9 x 1.4 x 1.7. It is partially exophytic. There is no
hydronephrosis.

Left Kidney:

Length: 14.2 cm. Echogenicity within normal limits. No mass or
hydronephrosis visualized.

Bladder:

Limited visualization due to bandage material from abdominal
surgery. Appears normal for degree of bladder distention.

Incidental note is made of sludge within the gallbladder.
IMPRESSION: 1.9 x 1.4 x 1.7 cm exophytic hypoechoic structure not clearly
reflecting a simple cyst. Use located in the right upper pole
cortex. Further evaluation with hepatic protocol MRI is recommended
when the patient can tolerate the procedure.

Normal appearance of the left kidney and visualized portions of the
urinary bladder.

Incidental note is made of sludge within the gallbladder.

## 2018-06-02 DIAGNOSIS — R7303 Prediabetes: Secondary | ICD-10-CM | POA: Diagnosis not present

## 2018-06-02 DIAGNOSIS — E782 Mixed hyperlipidemia: Secondary | ICD-10-CM | POA: Diagnosis not present

## 2018-06-02 DIAGNOSIS — I10 Essential (primary) hypertension: Secondary | ICD-10-CM | POA: Diagnosis not present

## 2018-06-02 DIAGNOSIS — I4891 Unspecified atrial fibrillation: Secondary | ICD-10-CM | POA: Diagnosis not present

## 2018-06-02 DIAGNOSIS — Z6830 Body mass index (BMI) 30.0-30.9, adult: Secondary | ICD-10-CM | POA: Diagnosis not present

## 2018-06-02 DIAGNOSIS — I2510 Atherosclerotic heart disease of native coronary artery without angina pectoris: Secondary | ICD-10-CM | POA: Diagnosis not present

## 2018-06-02 DIAGNOSIS — Z23 Encounter for immunization: Secondary | ICD-10-CM | POA: Diagnosis not present

## 2018-06-12 NOTE — Progress Notes (Signed)
Ryan Mccall Date of Birth: 1946/04/10 Medical Record #[id number]  History of Present Illness: Ryan Mccall is seen back today for follow up of CAD. He has known CAD, HTN and HLD. He had remote CABG per Dr. Cyndia Bent in 1999 with LIMA to LAD, SVG to 1st DX and SVG to intermediate and OM of the LCX. He did have transient atrial fibrillation during a stress test in 2009. His last stress Myoview in July 2017showed normal perfusion. He was diagnosed with persistent atrial fibrillation in July 2014. Treated with rate control and anticoagulation.  Echocardiogram showed normal LV function with mild mitral insufficiency and moderate left atrial enlargement.  On his last visit last summer BP was poorly controlled. He was referred to HTN clinic and BP stabilized on irbesartan, sprionolactone, and Coreg.   He was admitted in October 2018 with perforated sigmoid colon with diverticulitis. He underwent surgery with placement of a Hartman's pouch. Colonoscopy in January 2019 showed diverticuli. Otherwise benign. He had closure of colostomy in February 2019.   Irbesartan was later discontinued due to low BP.    On follow up today he is doing well. He is now retired.  He brings extensive BP and HR recordings which have been normal.  He denies any chest pain, dyspnea, palpitations, no edema. He feels well and is active doing a lot of mowing.  Current Outpatient Medications on File Prior to Visit  Medication Sig Dispense Refill  . carvedilol (COREG) 12.5 MG tablet Take 6.25 mg by mouth 2 (two) times daily with a meal.    . COD LIVER OIL PO Take 1 capsule by mouth 3 (three) times a week.     . diclofenac sodium (VOLTAREN) 1 % GEL Apply 1 application topically daily as needed (joint pain).    Marland Kitchen ezetimibe (ZETIA) 10 MG tablet Take 10 mg by mouth at bedtime.     . feeding supplement, ENSURE ENLIVE, (ENSURE ENLIVE) LIQD Take 237 mLs by mouth 2 (two) times daily between meals. 237 mL 12  . fexofenadine (ALLEGRA) 180 MG  tablet Take 180 mg by mouth daily as needed for allergies.     . fish oil-omega-3 fatty acids 1000 MG capsule Take 1 g by mouth 3 (three) times a week.     . Influenza Vac Typ A&B Surf Ant SUSP Fluvirin 2016-2017 45 mcg (15 mcg x 3)/0.5 mL intramuscular suspension  ADM 0.5ML IM UTD    . naproxen sodium (ALEVE) 220 MG tablet Take 220 mg by mouth daily as needed (pain).    . nitroGLYCERIN (NITROSTAT) 0.4 MG SL tablet Place 0.4 mg under the tongue as needed for chest pain. May repeat in 15 minutes up to 3 times for chest pain.  2  . oxyCODONE (OXY IR/ROXICODONE) 5 MG immediate release tablet Take 1-2 tablets (5-10 mg total) by mouth every 4 (four) hours as needed for moderate pain or severe pain. 30 tablet 0  . rivaroxaban (XARELTO) 20 MG TABS tablet Take 1 tablet (20 mg total) by mouth daily with supper. 90 tablet 1  . simvastatin (ZOCOR) 10 MG tablet Take 10 mg by mouth at bedtime.    Marland Kitchen spironolactone (ALDACTONE) 25 MG tablet Take 0.5 tablets (12.5 mg total) by mouth daily. 45 tablet 3  . UNABLE TO FIND 1 tablet as needed. Med Name: cardio-plus     No current facility-administered medications on file prior to visit.     Allergies  Allergen Reactions  . Shellfish Allergy Nausea And Vomiting  Past Medical History:  Diagnosis Date  . Atrial fibrillation ([place])   . Coronary artery disease    s/p remote CABG 1999 per Dr. Cyndia Bent;  Lexiscan Myoview (01/2014):  No ischemia, not gated, normal study  . Hypercholesterolemia   . Hypertension   . 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 COLECTOMY WITH COLOSTOMY CREATION/HARTMANN PROCEDURE N/A 07/03/2017   Procedure: COLECTOMY WITH COLOSTOMY CREATION/HARTMANN PROCEDURE;  Surgeon: Florene Glen, MD;  Location: [facility] ORS;  Service: General;  Laterality: N/A;  . COLONOSCOPY WITH PROPOFOL N/A 10/27/2017   Procedure: COLONOSCOPY WITH PROPOFOL through colostomy;  Surgeon: Lucilla Lame, MD;  Location: [facility];  Service: Endoscopy;  Laterality: N/A;  . COLOSTOMY CLOSURE N/A 11/17/2017   Procedure: COLOSTOMY CLOSURE;  Surgeon: Florene Glen, MD;  Location: [facility] ORS;  Service: General;  Laterality: N/A;  . CORONARY ARTERY BYPASS GRAFT  1999  . excision of skin cancer    . KNEE ARTHROSCOPY W/ MENISCAL REPAIR      Social History   Tobacco Use  Smoking Status Never Smoker  Smokeless Tobacco Former Systems developer  . Types: Chew    Social History   Substance and Sexual Activity  Alcohol Use No  . Alcohol/week: 0.0 standard drinks    Family History  Problem Relation Age of Onset  . Lung disease Father 86    Review of Systems: The review of systems is per the HPI.  All other systems were reviewed and are negative.  Physical Exam: BP (!) 155/92   Pulse (!) 56   Ht 5\' 8"  (1.727 m)   Wt 209 lb 6.4 oz (95 kg)   BMI 31.84 kg/m   GENERAL:  Well appearing, obese WM in NAD HEENT:  PERRL, EOMI, sclera are clear. Oropharynx is clear. NECK:  No jugular venous distention, carotid upstroke brisk and symmetric, no bruits, no thyromegaly or adenopathy LUNGS:  Clear to auscultation bilaterally CHEST:  Unremarkable HEART:  RRR,  PMI not displaced or sustained,S1 and S2 within normal limits, no S3, no S4: no clicks, no rubs, no murmurs ABD:  Soft, nontender. BS +, no masses or bruits. No hepatomegaly, no splenomegaly EXT:  2 + pulses throughout, no edema, no cyanosis no clubbing SKIN:  Warm and dry.  No rashes NEURO:  Alert and oriented x 3. Cranial nerves II through XII intact. PSYCH:  Cognitively intact     LABORATORY DATA:   Lab Results  Component Value Date   WBC 7.1 11/21/2017   HGB 12.1 (L) 11/21/2017   HCT 35.5 (L) 11/21/2017   PLT 174 11/21/2017   GLUCOSE 112 (H) 11/23/2017   CHOL 125 01/23/2014   TRIG 64 07/13/2017   HDL 37.10 (L) 01/23/2014   LDLCALC 74 01/23/2014   ALT 20 11/18/2017   AST 39 11/18/2017   NA 132 (L) 11/23/2017   K 3.5  11/25/2017   CL 100 (L) 11/23/2017   CREATININE 0.73 11/23/2017   BUN 12 11/23/2017   CO2 26 11/23/2017   TSH 3.48 04/08/2013   INR 1.99 07/01/2017   Labs from 02/18/17:  CBC with platelet count 130K. Marland Kitchen Cholesterol 127, triglycerides 79, LDL 73, HDL 38. A1c 5.6%. CMET normal. Dated 10/28/17: cholesterol 126, triglycerides 91, HDL 38, LDL 66. A1c 5.3%. CBC and CMET normal. Dated 06/02/18: cholesterol 122, triglycerides 64, HDL 45, LDL 64. A1c 5.3%. CBC and CMET normal    Myoview 04/11/16: Study  Highlights    Nuclear stress EF: 54%. The LV systolic function is normal  There was no ST segment deviation noted during stress.  The study is normal. no ischemia. No infarction  This is a low risk study.       Assessment / Plan: 1. CAD - remote CABG in 1999 - normal Myoview in July 2017. Patient has class 1 symptoms- stable. Continue risk factor modification. Continue Coreg.    2. Atrial fibrillation - permanent. Rate control is excellent on Coreg. Patient asymptomatic. Continue anticoagulation with Xarelto.  3. HTN -  Well  Controlled on Coreg and spironolactone.   4. HLD - on statin. Well controlled.  5. Obesity.encouraged weight loss   6. Sigmoid diverticulitis with rupture. S/p Hartman's pouch with subsequent revision.  Plan I will followup again in 6 months.     Ryan Mccall Date of Birth: 04-Jun-1946 Medical Record #[id number]  History of Present Illness: Ryan Mccall is seen back today for follow up of CAD. He has known CAD, HTN and HLD. He had remote CABG per Dr. Cyndia Bent in 1999 with LIMA to LAD, SVG to 1st DX and SVG to intermediate and OM of the LCX. He did have transient atrial fibrillation during a stress test in 2009. His last stress Myoview in July 2017showed normal perfusion. He was diagnosed with persistent atrial fibrillation in July 2014. Treated with rate control and anticoagulation.  Echocardiogram showed normal LV function with mild mitral insufficiency and moderate  left atrial enlargement.  On his last visit last summer BP was poorly controlled. He was referred to HTN clinic and BP stabilized on irbesartan, sprionolactone, and Coreg.   He was admitted in October 2018 with perforated sigmoid colon with diverticulitis. He underwent surgery with placement of a Hartman's pouch. Colonoscopy in January 2019 showed diverticuli. Otherwise benign.   On follow up today he is doing well. He is scheduled to have reversal of his Hartman's pouch next week. He has not been on irbesartan since his last surgery due to hypotension. He brings extensive BP and HR recordings which have been normal. Notes his HR was elevated last week at doctor's appointment but this was unusual. He denies any chest pain, dyspnea, palpitations, no edema.  Current Outpatient Medications on File Prior to Visit  Medication Sig Dispense Refill  . carvedilol (COREG) 12.5 MG tablet Take 6.25 mg by mouth 2 (two) times daily with a meal.    . COD LIVER OIL PO Take 1 capsule by mouth 3 (three) times a week.     . diclofenac sodium (VOLTAREN) 1 % GEL Apply 1 application topically daily as needed (joint pain).    Marland Kitchen ezetimibe (ZETIA) 10 MG tablet Take 10 mg by mouth at bedtime.     . feeding supplement, ENSURE ENLIVE, (ENSURE ENLIVE) LIQD Take 237 mLs by mouth 2 (two) times daily between meals. 237 mL 12  . fexofenadine (ALLEGRA) 180 MG tablet Take 180 mg by mouth daily as needed for allergies.     . fish oil-omega-3 fatty acids 1000 MG capsule Take 1 g by mouth 3 (three) times a week.     . Influenza Vac Typ A&B Surf Ant SUSP Fluvirin 2016-2017 45 mcg (15 mcg x 3)/0.5 mL intramuscular suspension  ADM 0.5ML IM UTD    . naproxen sodium (ALEVE) 220 MG tablet Take 220 mg by mouth daily as needed (pain).    . nitroGLYCERIN (NITROSTAT) 0.4 MG SL tablet Place 0.4 mg under the tongue as needed  for chest pain. May repeat in 15 minutes up to 3 times for chest pain.  2  . oxyCODONE (OXY IR/ROXICODONE) 5 MG immediate  release tablet Take 1-2 tablets (5-10 mg total) by mouth every 4 (four) hours as needed for moderate pain or severe pain. 30 tablet 0  . rivaroxaban (XARELTO) 20 MG TABS tablet Take 1 tablet (20 mg total) by mouth daily with supper. 90 tablet 1  . simvastatin (ZOCOR) 10 MG tablet Take 10 mg by mouth at bedtime.    Marland Kitchen spironolactone (ALDACTONE) 25 MG tablet Take 0.5 tablets (12.5 mg total) by mouth daily. 45 tablet 3  . UNABLE TO FIND 1 tablet as needed. Med Name: cardio-plus     No current facility-administered medications on file prior to visit.     Allergies  Allergen Reactions  . Shellfish Allergy Nausea And Vomiting    Past Medical History:  Diagnosis Date  . Atrial fibrillation ([place])   . Coronary artery disease    s/p remote CABG 1999 per Dr. Cyndia Bent;  Lexiscan Myoview (01/2014):  No ischemia, not gated, normal study  . Hypercholesterolemia   . Hypertension   . 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 COLECTOMY WITH COLOSTOMY CREATION/HARTMANN PROCEDURE N/A 07/03/2017   Procedure: COLECTOMY WITH COLOSTOMY CREATION/HARTMANN PROCEDURE;  Surgeon: Florene Glen, MD;  Location: [facility] OR[facility];  Service: General;  Laterality: N/A;  . COLONOSCOPY WITH PROPOFOL N/A 10/27/2017   Procedure: COLONOSCOPY WITH PROPOFOL through colostomy;  Surgeon: Lucilla Lame, MD;  Location: [facility];  Service: Endoscopy;  Laterality: N/A;  . COLOSTOMY CLOSURE N/A 11/17/2017   Procedure: COLOSTOMY CLOSURE;  Surgeon: Florene Glen, MD;  Location: [facility] OR[facility];  Service: General;  Laterality: N/A;  . CORONARY ARTERY BYPASS GRAFT  1999  . excision of skin cancer    . KNEE ARTHROSCOPY W/ MENISCAL REPAIR      Social History   Tobacco Use  Smoking Status Never Smoker  Smokeless Tobacco Former Systems developer  . Types: Chew    Social History   Substance and Sexual Activity  Alcohol Use No  . Alcohol/week: 0.0 standard drinks    Family  History  Problem Relation Age of Onset  . Lung disease Father 30    Review of Systems: The review of systems is per the HPI.  All other systems were reviewed and are negative.  Physical Exam: BP (!) 155/92   Pulse (!) 56   Ht 5\' 8"  (1.727 m)   Wt 209 lb 6.4 oz (95 kg)   BMI 31.84 kg/m   GENERAL:  Well appearing WM obese HEENT:  PERRL, EOMI, sclera are clear. Oropharynx is clear. NECK:  No jugular venous distention, carotid upstroke brisk and symmetric, no bruits, no thyromegaly or adenopathy LUNGS:  Clear to auscultation bilaterally CHEST:  Unremarkable HEART:  IRRR,  PMI not displaced or sustained,S1 and S2 within normal limits, no S3, no S4: no clicks, no rubs, no murmurs ABD:  Soft, nontender. BS +, no masses or bruits. No hepatomegaly, no splenomegaly. Hartman's pouch in place. EXT:  2 + pulses throughout, no edema, no cyanosis no clubbing SKIN:  Warm and dry.  No rashes NEURO:  Alert and oriented x 3. Cranial nerves II through XII intact. PSYCH:  Cognitively intact     LABORATORY DATA:   Lab Results  Component Value Date   WBC 7.1 11/21/2017   HGB 12.1 (L) 11/21/2017  HCT 35.5 (L) 11/21/2017   PLT 174 11/21/2017   GLUCOSE 112 (H) 11/23/2017   CHOL 125 01/23/2014   TRIG 64 07/13/2017   HDL 37.10 (L) 01/23/2014   LDLCALC 74 01/23/2014   ALT 20 11/18/2017   AST 39 11/18/2017   NA 132 (L) 11/23/2017   K 3.5 11/25/2017   CL 100 (L) 11/23/2017   CREATININE 0.73 11/23/2017   BUN 12 11/23/2017   CO2 26 11/23/2017   TSH 3.48 04/08/2013   INR 1.99 07/01/2017   Labs from 02/18/17:  CBC with platelet count 130K. Marland Kitchen Cholesterol 127, triglycerides 79, LDL 73, HDL 38. A1c 5.6%. CMET normal. Dated 10/28/17: cholesterol 126, triglycerides 91, HDL 38, LDL 66. A1c 5.3%. CBC and CMET normal.   Myoview 04/11/16: Study Highlights    Nuclear stress EF: 54%. The LV systolic function is normal  There was no ST segment deviation noted during stress.  The study is normal. no  ischemia. No infarction  This is a low risk study.     ds  Assessment / Plan: 1. CAD - remote CABG in 1999 - normal Myoview in July 2017. Patient has class 1 symptoms- stable. Continue risk factor modification. Continue Coreg.    2. Atrial fibrillation - permanent. Rate control is excellent on Coreg. Patient is minimally symptomatic. Continue anticoagulation with Xarelto.  3. HTN -  Well  Controlled on Coreg and spironolactone.   4. HLD - on statin. Well controlled.  5. Obesity.encouraged weight loss   6. Sigmoid diverticulitis with rupture. S/p Hartman's pouch. Clear for revision next week from a cardiac standpoint.   Plan I will followup again in 6 months.

## 2018-06-17 ENCOUNTER — Encounter: Payer: Self-pay | Admitting: Cardiology

## 2018-06-17 ENCOUNTER — Ambulatory Visit: Payer: PPO | Admitting: Cardiology

## 2018-06-17 VITALS — BP 155/92 | HR 56 | Ht 68.0 in | Wt 209.4 lb

## 2018-06-17 DIAGNOSIS — E7800 Pure hypercholesterolemia, unspecified: Secondary | ICD-10-CM | POA: Diagnosis not present

## 2018-06-17 DIAGNOSIS — I10 Essential (primary) hypertension: Secondary | ICD-10-CM

## 2018-06-17 DIAGNOSIS — I482 Chronic atrial fibrillation, unspecified: Secondary | ICD-10-CM

## 2018-06-17 DIAGNOSIS — I25708 Atherosclerosis of coronary artery bypass graft(s), unspecified, with other forms of angina pectoris: Secondary | ICD-10-CM

## 2018-06-17 NOTE — Patient Instructions (Signed)
Continue your current therapy  I will see you in 6 months.   

## 2018-08-17 ENCOUNTER — Telehealth: Payer: Self-pay | Admitting: Cardiology

## 2018-08-17 MED ORDER — CARVEDILOL 12.5 MG PO TABS: 6.2500 mg | ORAL_TABLET | Freq: Two times a day (BID) | ORAL | 3 refills | Status: DC

## 2018-08-17 MED ORDER — SPIRONOLACTONE 25 MG PO TABS: 12.5000 mg | ORAL_TABLET | Freq: Every day | ORAL | 3 refills | Status: DC

## 2018-08-17 NOTE — Telephone Encounter (Signed)
New Message    *STAT* If patient is at the pharmacy, call can be transferred to refill team.   1. Which medications need to be refilled? (please list name of each medication and dose if known) carvedilol (COREG) 12.5 MG tablet and spironolactone (ALDACTONE) 25 MG tablet  2. Which pharmacy/location (including street and city if local pharmacy) is medication to be sent to? [facility] - [place], [place]  3. Do they need a 30 day or 90 day supply?

## 2018-08-17 NOTE — Telephone Encounter (Signed)
Returned call to patient.Stated he needs 90 day refills on medications listed below.Refills sent to pharmacy.

## 2018-08-19 ENCOUNTER — Telehealth: Payer: Self-pay

## 2018-08-19 NOTE — Telephone Encounter (Signed)
Spoke to patient we received letter from [facility] order pharmacy may be taking duplicate carvedilol.Stated he is taking carvedilol 12.5 mg 1/2 tablet twice a day.Form faxed back to [facility] at fax # [phone].

## 2018-08-20 ENCOUNTER — Telehealth: Payer: Self-pay | Admitting: Cardiology

## 2018-08-20 IMAGING — DX DG ABD PORTABLE 1V
1 series · 1 of 1 positions shown · non-contrast
Comparison: CT from 07/01/2017

CLINICAL DATA: Check nasogastric catheter placement

EXAM:
PORTABLE ABDOMEN - 1 VIEW

[abdomen kub]
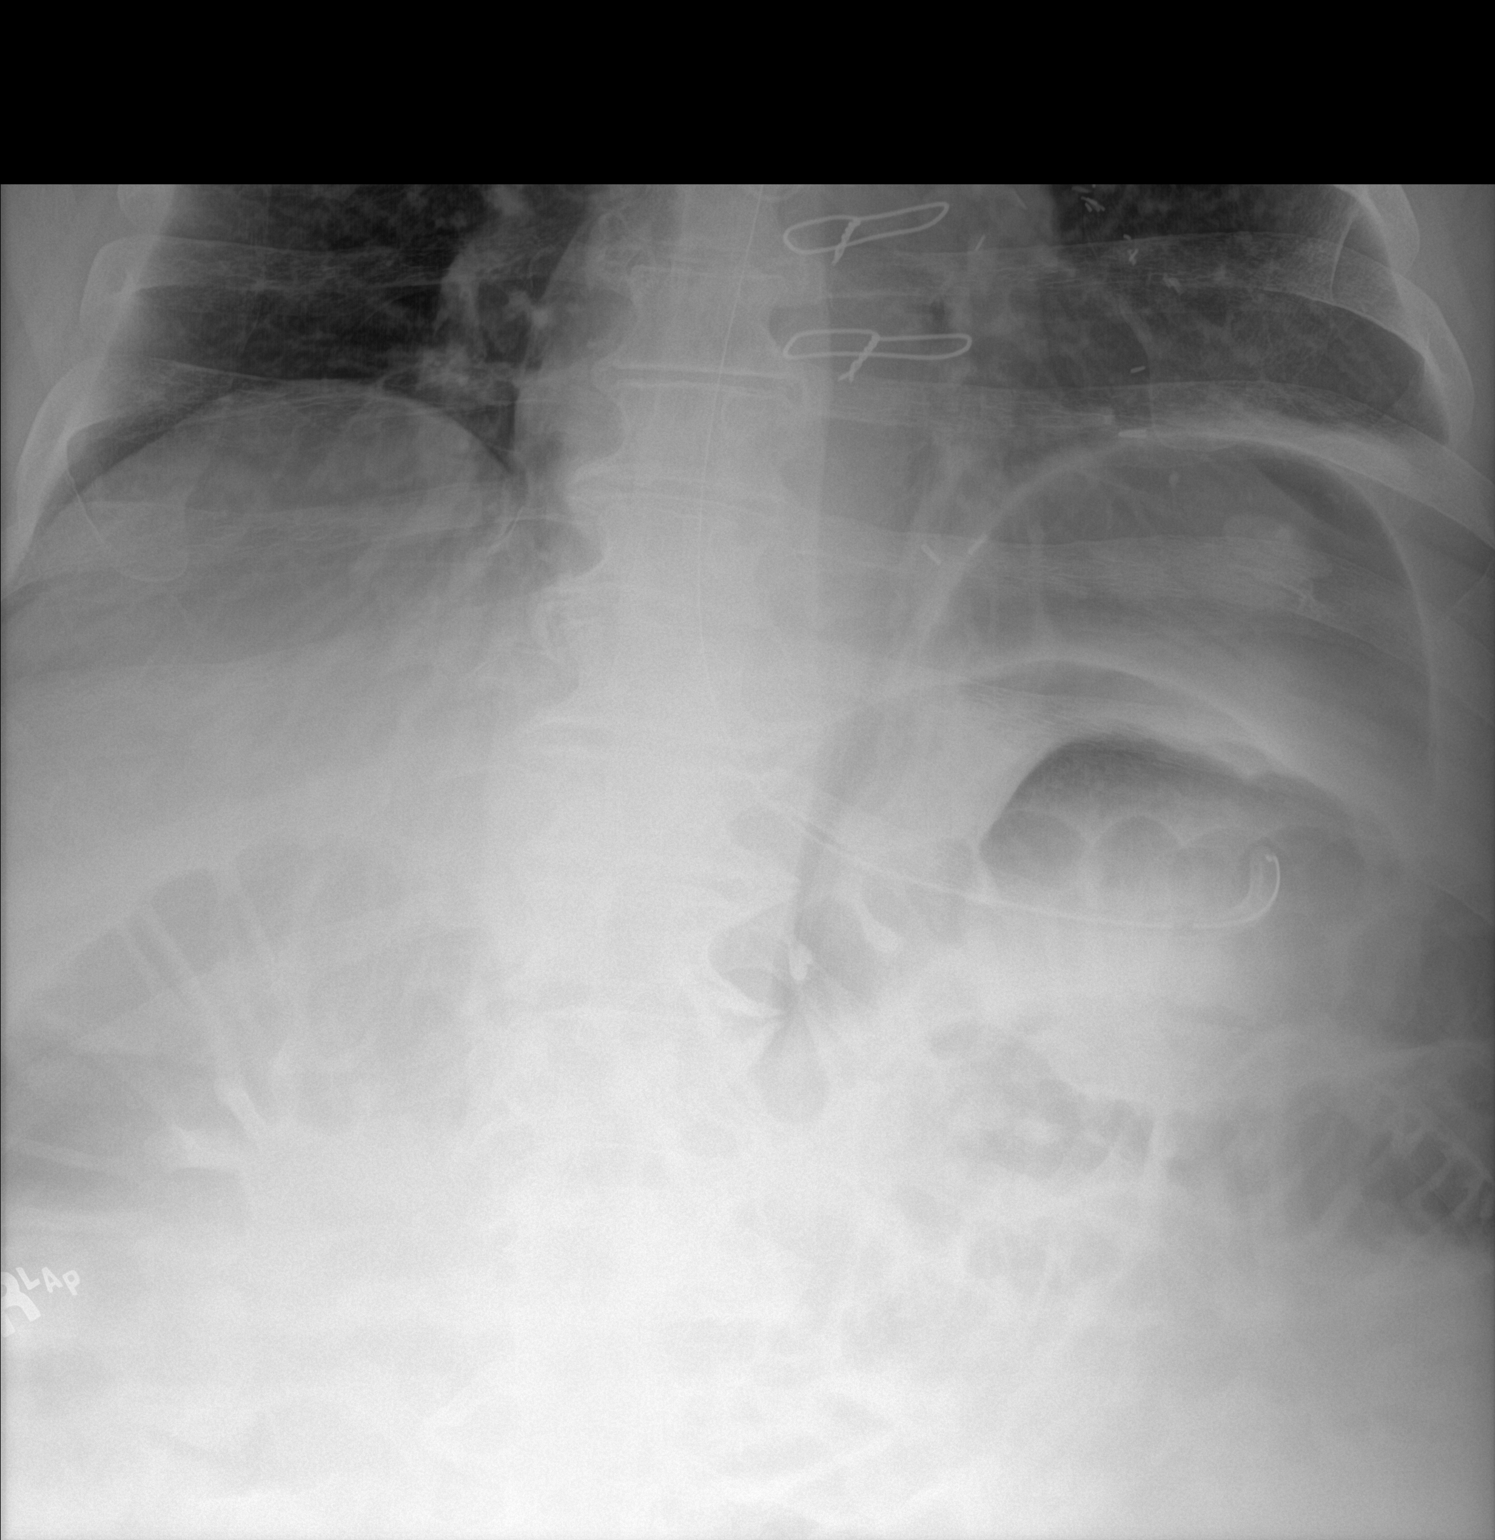

[1 of 1 positions shown; findings below may reference images not displayed]

FINDINGS: Nasogastric catheter is noted within the stomach. Colonic and small
bowel gas is seen but incompletely evaluated.
IMPRESSION: Nasogastric catheter within the stomach.

## 2018-08-20 MED ORDER — CARVEDILOL 6.25 MG PO TABS: 6.2500 mg | ORAL_TABLET | Freq: Two times a day (BID) | ORAL | 3 refills | Status: DC

## 2018-08-20 NOTE — Telephone Encounter (Signed)
Coreg 6.25 mg twice a day sent to [facility] mail order.

## 2018-08-20 NOTE — Telephone Encounter (Signed)
Pt c/o medication issue:  1. Name of Medication:  carvedilol (COREG) 12.5 MG tablet  2. How are you currently taking this medication (dosage and times per day)?  Take 0.5 tablets (6.25 mg total) by mouth 2 (two) times daily with a meal  3. Are you having a reaction (difficulty breathing--STAT)? no  4. What is your medication issue?   Mateo Flow from [employer] is calling to verify dose change and also wants to know if Dr. [name] would like to just order the 6.25mg  tablet as the tablet is not scored therefore making it difficult to break in half.  Mateo Flow can be reached at [phone]

## 2018-08-25 IMAGING — DX DG ABDOMEN 1V
1 series · 1 of 1 positions shown · non-contrast
Comparison: Abdominal radiographs of [REDACTED] 8701.

CLINICAL DATA: The patient has undergone nasogastric tube
placement.

EXAM:
ABDOMEN - 1 VIEW

[abdomen kub]
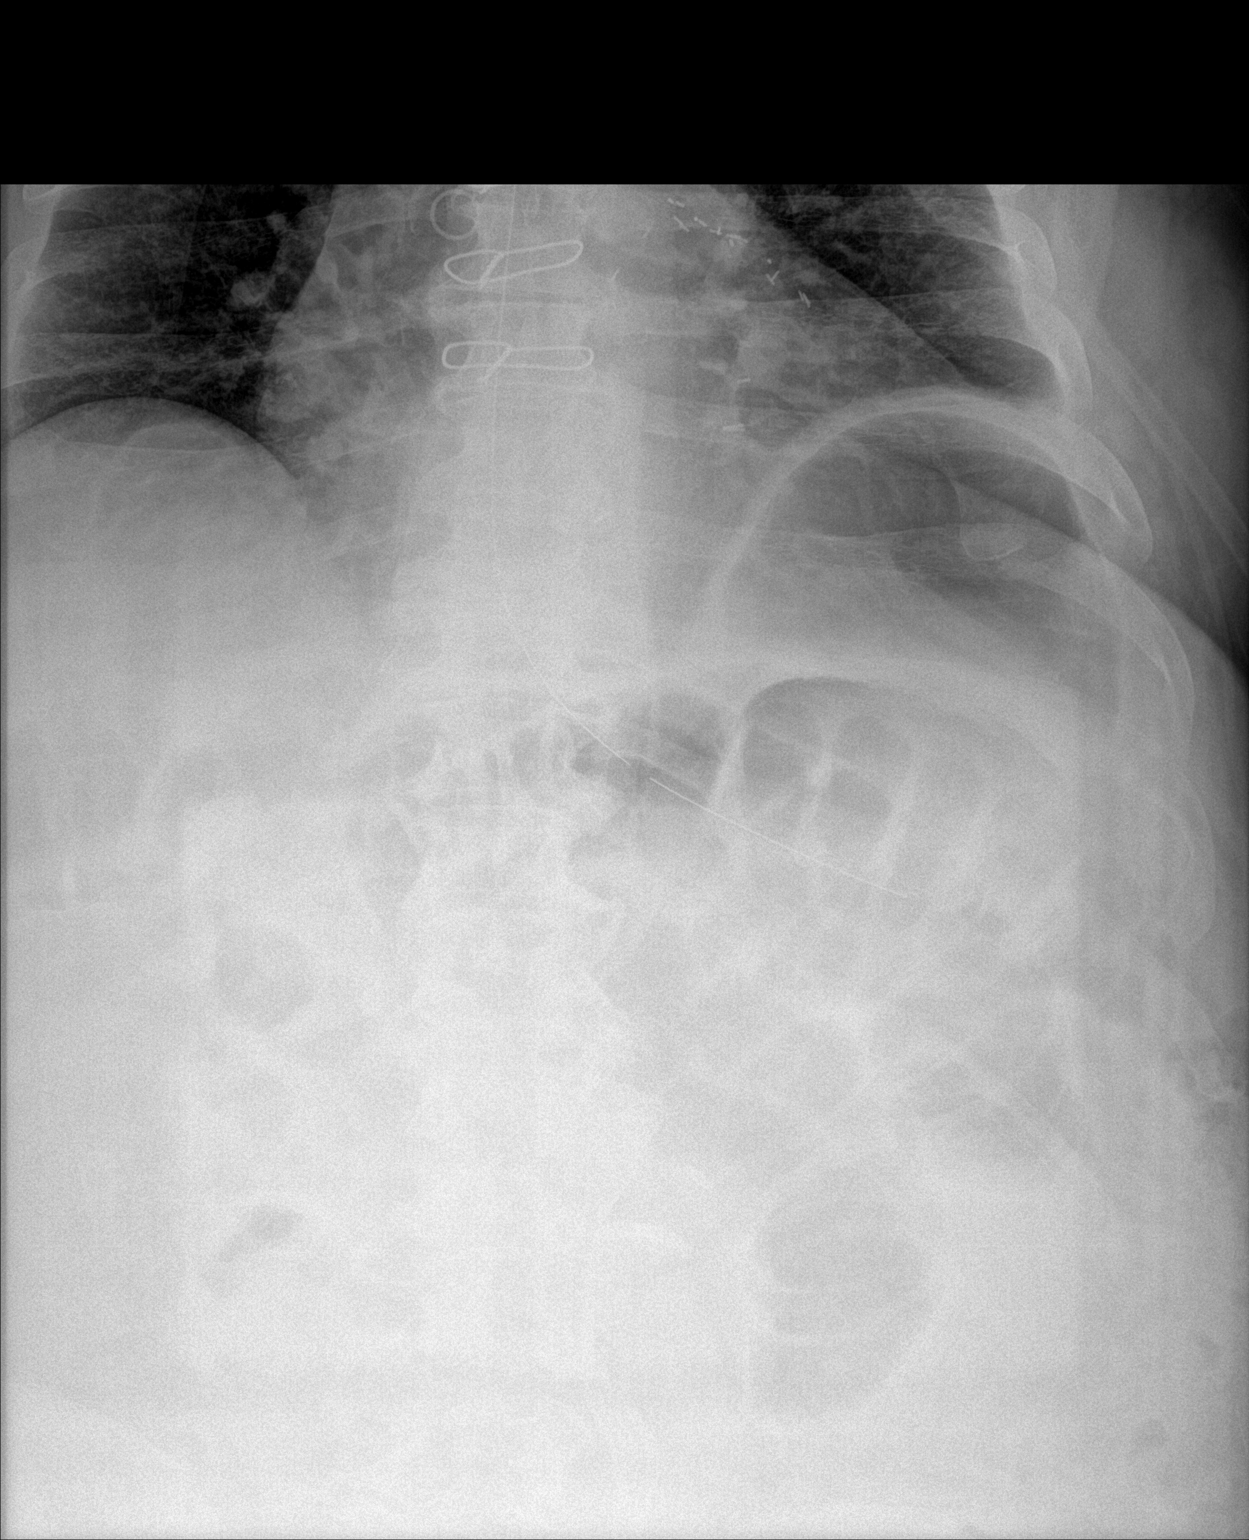

[1 of 1 positions shown; findings below may reference images not displayed]

FINDINGS: The tip of the esophagogastric tube projects in the region of the
gastric cardia. The proximal port lies at the level of the GE
junction. There remain loops of moderately distended fluid and
air-filled small bowel in the upper and mid abdomen.
IMPRESSION: The position of the esophagogastric tube is as described.
Advancement by approximately 5-10 cm would assure that the proximal
port is positioned below the GE junction.

## 2018-10-04 DIAGNOSIS — Z6832 Body mass index (BMI) 32.0-32.9, adult: Secondary | ICD-10-CM | POA: Diagnosis not present

## 2018-10-04 DIAGNOSIS — Z7901 Long term (current) use of anticoagulants: Secondary | ICD-10-CM | POA: Diagnosis not present

## 2018-10-04 DIAGNOSIS — I10 Essential (primary) hypertension: Secondary | ICD-10-CM | POA: Diagnosis not present

## 2018-10-04 DIAGNOSIS — E782 Mixed hyperlipidemia: Secondary | ICD-10-CM | POA: Diagnosis not present

## 2018-10-04 DIAGNOSIS — I2510 Atherosclerotic heart disease of native coronary artery without angina pectoris: Secondary | ICD-10-CM | POA: Diagnosis not present

## 2018-10-04 DIAGNOSIS — I482 Chronic atrial fibrillation, unspecified: Secondary | ICD-10-CM | POA: Diagnosis not present

## 2018-10-04 DIAGNOSIS — R7303 Prediabetes: Secondary | ICD-10-CM | POA: Diagnosis not present

## 2018-11-11 ENCOUNTER — Encounter: Payer: Self-pay | Admitting: Cardiology

## 2018-11-24 NOTE — Progress Notes (Signed)
Ryan Mccall Date of Birth: January 16, 1946 Medical Record #[id number]  History of Present Illness: Mr. Ryan Mccall is seen back today for follow up of CAD. He has known CAD, HTN and HLD. He had remote CABG per Dr. Cyndia Mccall in 1999 with LIMA to LAD, SVG to 1st DX and SVG to intermediate and OM of the LCX. He did have transient atrial fibrillation during a stress test in 2009. His last stress Myoview in July 2017showed normal perfusion. He was diagnosed with persistent atrial fibrillation in July 2014. Treated with rate control and anticoagulation.  Echocardiogram showed normal LV function with mild mitral insufficiency and moderate left atrial enlargement.  On his last visit last summer BP was poorly controlled. He was referred to HTN clinic and BP stabilized on irbesartan, sprionolactone, and Coreg.   He was admitted in October 2018 with perforated sigmoid colon with diverticulitis. He underwent surgery with placement of a Ryan Mccall pouch. Colonoscopy in January 2019 showed diverticuli. Otherwise benign. He had closure of colostomy in February 2019.   Irbesartan was later discontinued due to low BP.    On follow up today he is doing well. He brings extensive BP and HR recordings which have been good with rare elevation.  He denies any chest pain, dyspnea, palpitations, no edema. He has been less active this winter and has gained weight. No bowel complaints.   Current Outpatient Medications on File Prior to Visit  Medication Sig Dispense Refill  . carvedilol (COREG) 6.25 MG tablet Take 1 tablet (6.25 mg total) by mouth 2 (two) times daily. 180 tablet 3  . COD LIVER OIL PO Take 1 capsule by mouth 3 (three) times a week.     . diclofenac sodium (VOLTAREN) 1 % GEL Apply 1 application topically daily as needed (joint pain).    Marland Kitchen ezetimibe (ZETIA) 10 MG tablet Take 10 mg by mouth at bedtime.     . feeding supplement, ENSURE ENLIVE, (ENSURE ENLIVE) LIQD Take 237 mLs by mouth 2 (two) times daily between meals.  237 mL 12  . fexofenadine (ALLEGRA) 180 MG tablet Take 180 mg by mouth daily as needed for allergies.     . fish oil-omega-3 fatty acids 1000 MG capsule Take 1 g by mouth 3 (three) times a week.     . naproxen sodium (ALEVE) 220 MG tablet Take 220 mg by mouth daily as needed (pain).    . nitroGLYCERIN (NITROSTAT) 0.4 MG SL tablet Place 0.4 mg under the tongue as needed for chest pain. May repeat in 15 minutes up to 3 times for chest pain.  2  . oxyCODONE (OXY IR/ROXICODONE) 5 MG immediate release tablet Take 1-2 tablets (5-10 mg total) by mouth every 4 (four) hours as needed for moderate pain or severe pain. 30 tablet 0  . simvastatin (ZOCOR) 10 MG tablet Take 10 mg by mouth at bedtime.    Marland Kitchen spironolactone (ALDACTONE) 25 MG tablet Take 0.5 tablets (12.5 mg total) by mouth daily. 45 tablet 3  . UNABLE TO FIND 1 tablet as needed. Med Name: cardio-plus     No current facility-administered medications on file prior to visit.     Allergies  Allergen Reactions  . Shellfish Allergy Nausea And Vomiting    Past Medical History:  Diagnosis Date  . Atrial fibrillation ([place])   . Coronary artery disease    s/p remote CABG 1999 per Dr. Cyndia Mccall;  Lexiscan Myoview (01/2014):  No ischemia, not gated, normal study  . Hypercholesterolemia   .  Hypertension   . 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 COLECTOMY WITH COLOSTOMY CREATION/HARTMANN PROCEDURE N/A 07/03/2017   Procedure: COLECTOMY WITH COLOSTOMY CREATION/HARTMANN PROCEDURE;  Surgeon: Ryan Glen, MD;  Location: [facility] ORS;  Service: General;  Laterality: N/A;  . COLONOSCOPY WITH PROPOFOL N/A 10/27/2017   Procedure: COLONOSCOPY WITH PROPOFOL through colostomy;  Surgeon: Ryan Lame, MD;  Location: [facility] ENDOSCOPY;  Service: Endoscopy;  Laterality: N/A;  . COLOSTOMY CLOSURE N/A 11/17/2017   Procedure: COLOSTOMY CLOSURE;  Surgeon: Ryan Glen, MD;  Location: [facility] ORS;   Service: General;  Laterality: N/A;  . CORONARY ARTERY BYPASS GRAFT  1999  . excision of skin cancer    . KNEE ARTHROSCOPY W/ MENISCAL REPAIR      Social History   Tobacco Use  Smoking Status Never Smoker  Smokeless Tobacco Former Systems developer  . Types: Chew    Social History   Substance and Sexual Activity  Alcohol Use No  . Alcohol/week: 0.0 standard drinks    Family History  Problem Relation Age of Onset  . Lung disease Father 79    Review of Systems: The review of systems is per the HPI.  All other systems were reviewed and are negative.  Physical Exam: BP 138/84   Pulse 67   Ht 5\' 9"  (1.753 m)   Wt 224 lb 4 oz (101.7 kg)   BMI 33.12 kg/m   GENERAL:  Well appearing WM in NAD HEENT:  PERRL, EOMI, sclera are clear. Oropharynx is clear. NECK:  No jugular venous distention, carotid upstroke brisk and symmetric, no bruits, no thyromegaly or adenopathy LUNGS:  Clear to auscultation bilaterally CHEST:  Unremarkable HEART:  RRR,  PMI not displaced or sustained,S1 and S2 within normal limits, no S3, no S4: no clicks, no rubs, no murmurs ABD:  Soft, nontender. BS +, no masses or bruits. No hepatomegaly, no splenomegaly EXT:  2 + pulses throughout, no edema, no cyanosis no clubbing SKIN:  Warm and dry.  No rashes NEURO:  Alert and oriented x 3. Cranial nerves II through XII intact. PSYCH:  Cognitively intact       LABORATORY DATA:   Lab Results  Component Value Date   WBC 7.1 11/21/2017   HGB 12.1 (L) 11/21/2017   HCT 35.5 (L) 11/21/2017   PLT 174 11/21/2017   GLUCOSE 112 (H) 11/23/2017   CHOL 125 01/23/2014   TRIG 64 07/13/2017   HDL 37.10 (L) 01/23/2014   LDLCALC 74 01/23/2014   ALT 20 11/18/2017   AST 39 11/18/2017   NA 132 (L) 11/23/2017   K 3.5 11/25/2017   CL 100 (L) 11/23/2017   CREATININE 0.73 11/23/2017   BUN 12 11/23/2017   CO2 26 11/23/2017   TSH 3.48 04/08/2013   INR 1.99 07/01/2017   Labs from 02/18/17:  CBC with platelet count 130K. Marland Kitchen  Cholesterol 127, triglycerides 79, LDL 73, HDL 38. A1c 5.6%. CMET normal. Dated 10/28/17: cholesterol 126, triglycerides 91, HDL 38, LDL 66. A1c 5.3%. CBC and CMET normal. Dated 06/02/18: cholesterol 122, triglycerides 64, HDL 45, LDL 64. A1c 5.3%. CBC and CMET normal  Ecg today shows Afib with rate 67. Otherwise normal. I have personally reviewed and interpreted this study.   Myoview 04/11/16: Study Highlights    Nuclear stress EF: 54%. The LV systolic function is normal  There was no ST segment deviation noted during stress.  The study is  normal. no ischemia. No infarction  This is a low risk study.       Assessment / Plan: 1. CAD - remote CABG in 1999 - normal Myoview in July 2017. Patient has class 1 symptoms- stable angina. Continue risk factor modification. Continue Coreg.    2. Atrial fibrillation - permanent. Rate control is excellent on Coreg. He is asymptomatic. Continue anticoagulation with Xarelto.  3. HTN -  Well  Controlled on Coreg and spironolactone.   4. HLD - on statin. Well controlled.  5. Obesity.encouraged weight loss    Plan I will followup again in 6 months.

## 2018-11-29 ENCOUNTER — Ambulatory Visit: Payer: PPO | Admitting: Cardiology

## 2018-11-29 ENCOUNTER — Encounter: Payer: Self-pay | Admitting: Cardiology

## 2018-11-29 VITALS — BP 138/84 | HR 67 | Ht 69.0 in | Wt 224.2 lb

## 2018-11-29 DIAGNOSIS — E7800 Pure hypercholesterolemia, unspecified: Secondary | ICD-10-CM | POA: Diagnosis not present

## 2018-11-29 DIAGNOSIS — I10 Essential (primary) hypertension: Secondary | ICD-10-CM | POA: Diagnosis not present

## 2018-11-29 DIAGNOSIS — I482 Chronic atrial fibrillation, unspecified: Secondary | ICD-10-CM

## 2018-11-29 DIAGNOSIS — I25708 Atherosclerosis of coronary artery bypass graft(s), unspecified, with other forms of angina pectoris: Secondary | ICD-10-CM

## 2018-11-29 MED ORDER — RIVAROXABAN 20 MG PO TABS: 20.0000 mg | ORAL_TABLET | Freq: Every day | ORAL | 3 refills | Status: DC

## 2019-01-10 IMAGING — DX DG ABD PORTABLE 1V
1 series · 1 of 1 positions shown · non-contrast
Comparison: 11/22/2017

CLINICAL DATA: NG tube placement

EXAM:
PORTABLE ABDOMEN - 1 VIEW

[abdomen kub]
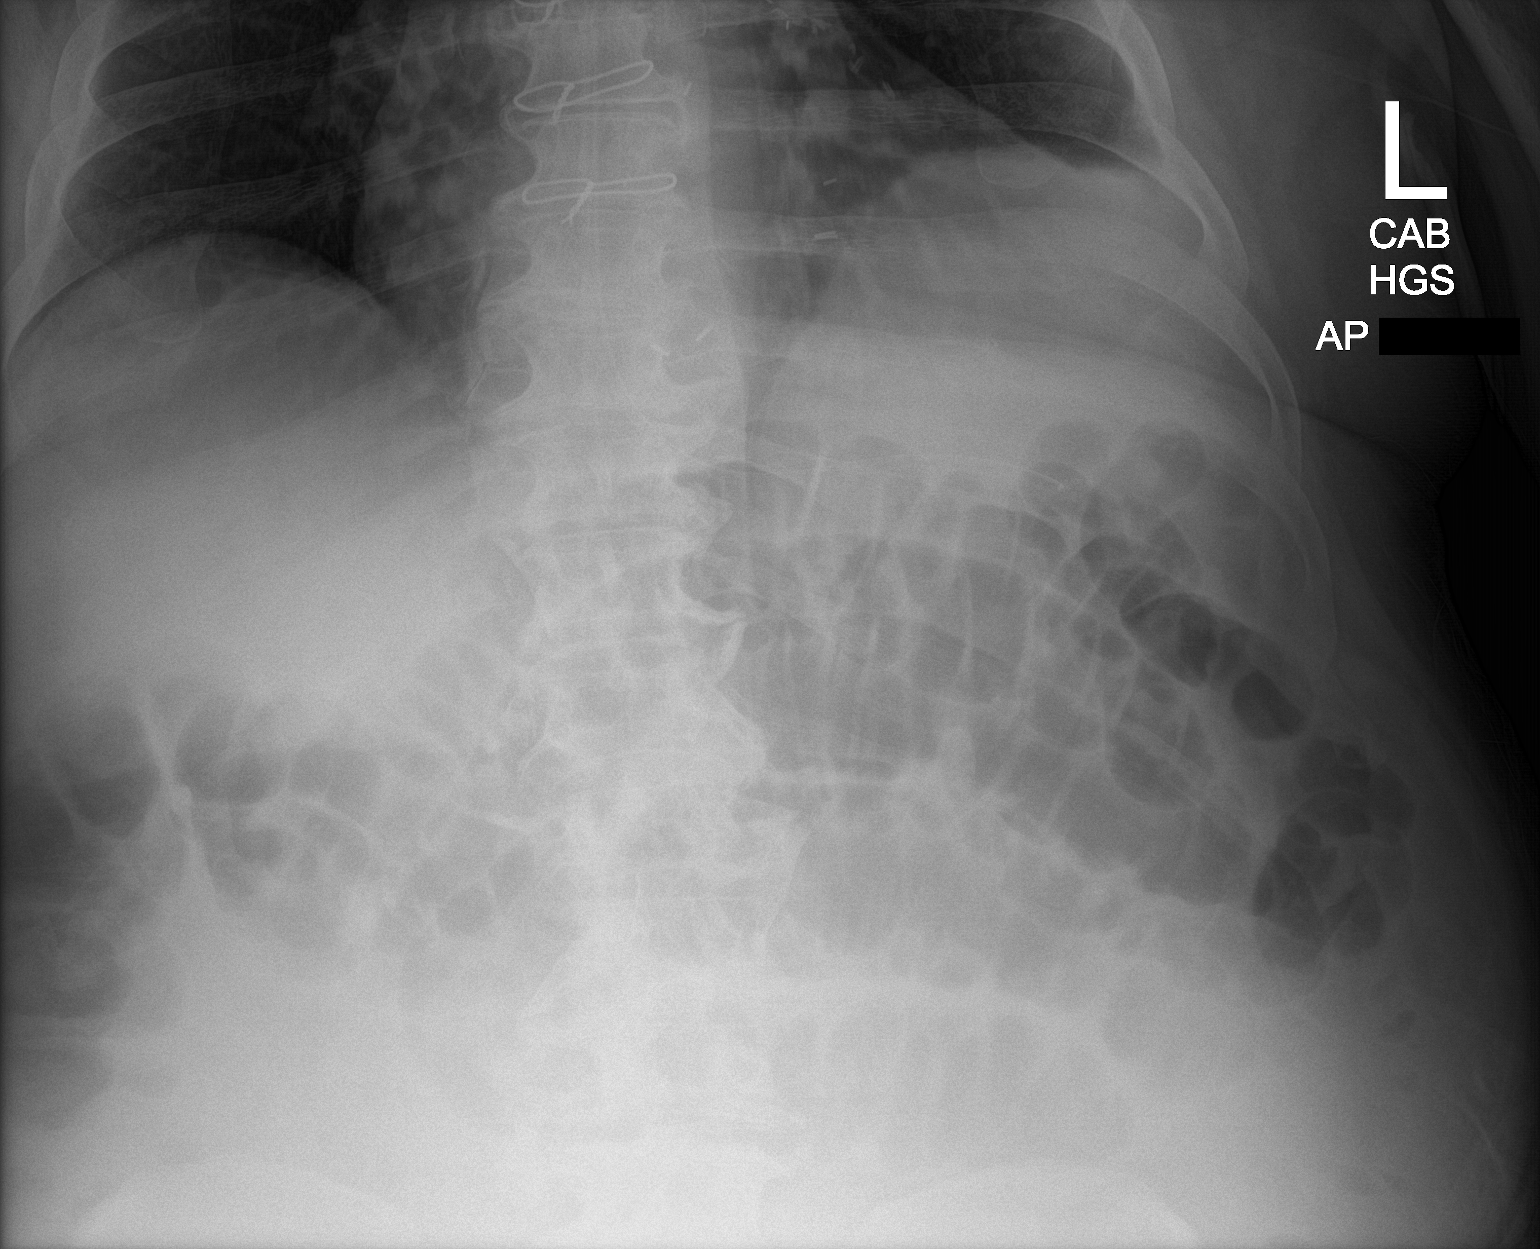

[1 of 1 positions shown; findings below may reference images not displayed]

FINDINGS: In NG tube tip is seen in the fundus of the stomach.
IMPRESSION: NG tube tip in the fundus of the stomach.

## 2019-01-10 IMAGING — DX DG ABD PORTABLE 1V
1 series · 1 of 1 positions shown · non-contrast
Comparison: 11/21/2017

CLINICAL DATA: NG tube placement

EXAM:
PORTABLE ABDOMEN - 1 VIEW

[abdomen kub]
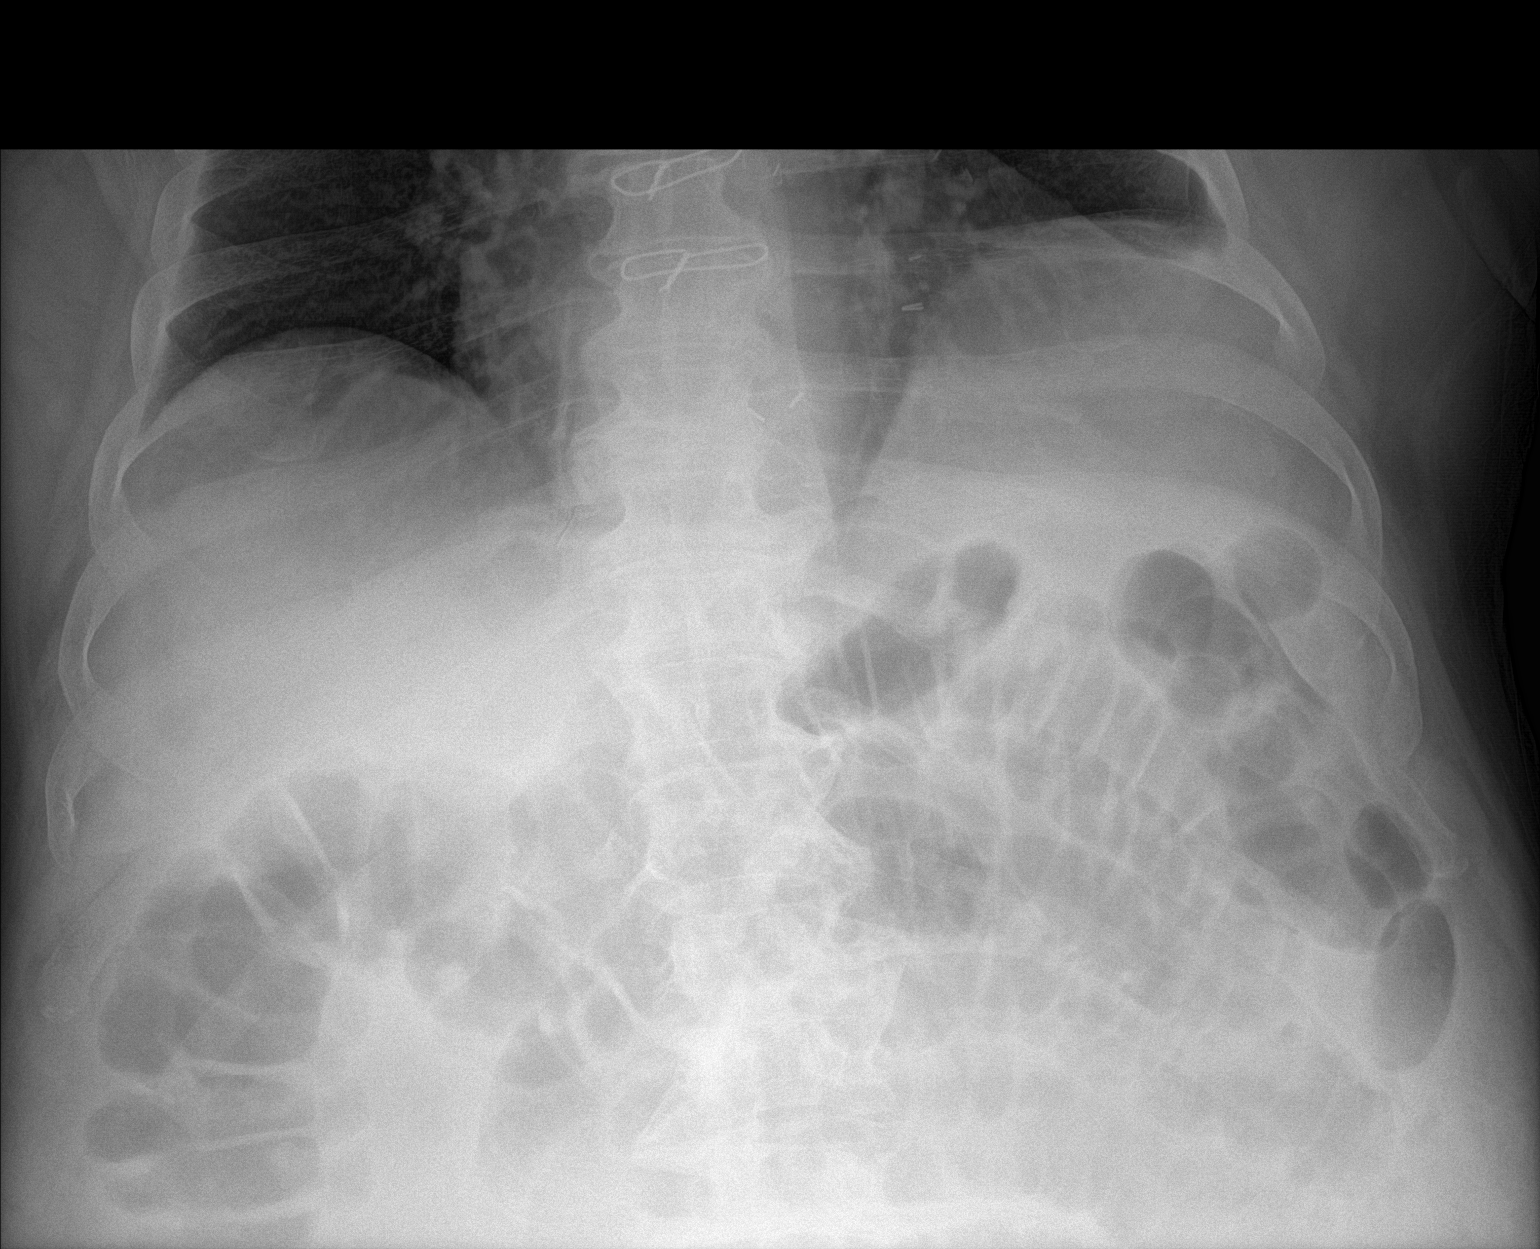

[1 of 1 positions shown; findings below may reference images not displayed]

FINDINGS: No NG tube is visualized in the lower chest or abdomen. Gaseous
distention of bowel again noted, unchanged.
IMPRESSION: No visible NG tube in the lower chest or abdomen.

## 2019-01-31 DIAGNOSIS — Z1159 Encounter for screening for other viral diseases: Secondary | ICD-10-CM | POA: Diagnosis not present

## 2019-01-31 DIAGNOSIS — Z6832 Body mass index (BMI) 32.0-32.9, adult: Secondary | ICD-10-CM | POA: Diagnosis not present

## 2019-01-31 DIAGNOSIS — I2510 Atherosclerotic heart disease of native coronary artery without angina pectoris: Secondary | ICD-10-CM | POA: Diagnosis not present

## 2019-01-31 DIAGNOSIS — E782 Mixed hyperlipidemia: Secondary | ICD-10-CM | POA: Diagnosis not present

## 2019-01-31 DIAGNOSIS — I10 Essential (primary) hypertension: Secondary | ICD-10-CM | POA: Diagnosis not present

## 2019-01-31 DIAGNOSIS — R7303 Prediabetes: Secondary | ICD-10-CM | POA: Diagnosis not present

## 2019-01-31 DIAGNOSIS — I482 Chronic atrial fibrillation, unspecified: Secondary | ICD-10-CM | POA: Diagnosis not present

## 2019-03-30 DIAGNOSIS — U071 COVID-19: Secondary | ICD-10-CM

## 2019-03-30 HISTORY — DX: COVID-19: U07.1

## 2019-05-02 DIAGNOSIS — Z20828 Contact with and (suspected) exposure to other viral communicable diseases: Secondary | ICD-10-CM | POA: Diagnosis not present

## 2019-05-02 DIAGNOSIS — Z1159 Encounter for screening for other viral diseases: Secondary | ICD-10-CM | POA: Diagnosis not present

## 2019-06-01 ENCOUNTER — Encounter: Payer: Self-pay | Admitting: Cardiology

## 2019-06-01 DIAGNOSIS — I10 Essential (primary) hypertension: Secondary | ICD-10-CM | POA: Diagnosis not present

## 2019-06-01 DIAGNOSIS — Z6832 Body mass index (BMI) 32.0-32.9, adult: Secondary | ICD-10-CM | POA: Diagnosis not present

## 2019-06-01 DIAGNOSIS — Z1159 Encounter for screening for other viral diseases: Secondary | ICD-10-CM | POA: Diagnosis not present

## 2019-06-01 DIAGNOSIS — Z23 Encounter for immunization: Secondary | ICD-10-CM | POA: Diagnosis not present

## 2019-06-01 DIAGNOSIS — R7303 Prediabetes: Secondary | ICD-10-CM | POA: Diagnosis not present

## 2019-06-01 DIAGNOSIS — I482 Chronic atrial fibrillation, unspecified: Secondary | ICD-10-CM | POA: Diagnosis not present

## 2019-06-01 DIAGNOSIS — I2510 Atherosclerotic heart disease of native coronary artery without angina pectoris: Secondary | ICD-10-CM | POA: Diagnosis not present

## 2019-06-01 DIAGNOSIS — E782 Mixed hyperlipidemia: Secondary | ICD-10-CM | POA: Diagnosis not present

## 2019-06-24 NOTE — Progress Notes (Signed)
Virtual Visit via Telephone Note   This visit type was conducted due to national recommendations for restrictions regarding the COVID-19 Pandemic (e.g. social distancing) in an effort to limit this patient's exposure and mitigate transmission in our community.  Due to his co-morbid illnesses, this patient is at least at moderate risk for complications without adequate follow up.  This format is felt to be most appropriate for this patient at this time.  The patient did not have access to video technology/had technical difficulties with video requiring transitioning to audio format only (telephone).  All issues noted in this document were discussed and addressed.  No physical exam could be performed with this format.  Please refer to the patient's chart for his  consent to telehealth for [facility].   Date:  06/28/2019   ID:  Ryan Mccall, DOB [birth year], MRN [id number]  Patient Location: Home Provider Location: Home  PCP:  Lillard Anes, MD  Cardiologist:  Airam Runions [name] MD Electrophysiologist:  None   Evaluation Performed:  Follow-Up Visit  Chief Complaint:  CAD  History of Present Illness:    Ryan Mccall is a 73 y.o. male with history of CAD and Afib. He has known CAD, HTN and HLD. He had remote CABG per Dr. Cyndia Bent in 1999 with LIMA to LAD, SVG to 1st DX and SVG to intermediate and OM of the LCX. He did have transient atrial fibrillation during a stress test in 2009. His last stress Myoview in July 2017showed normal perfusion. He was diagnosed with persistent atrial fibrillation in July 2014. Treated with rate control and anticoagulation.  Echocardiogram showed normal LV function with mild mitral insufficiency and moderate left atrial enlargement.  He also has a history of resistant HTN.  He was admitted in October 2018 with perforated sigmoid colon with diverticulitis. He underwent surgery with placement of a Hartman's pouch. Colonoscopy in January 2019 showed  diverticuli. Otherwise benign. He had closure of colostomy in February 2019.   Irbesartan was later discontinued due to low BP.   On follow up today he is doing well. He denies any chest pain, dyspnea, palpitations, or dizziness. Energy level is good. Has been taking simvastatin every other day. Main activity is mowing the lawn. Does report he had Covid 19 in July with very mild symptoms.   The patient does not have symptoms concerning for COVID-19 infection (fever, chills, cough, or new shortness of breath).    Past Medical History:  Diagnosis Date  . Atrial fibrillation ([place])   . Coronary artery disease    s/p remote CABG 1999 per Dr. Cyndia Bent;  Lexiscan Myoview (01/2014):  No ischemia, not gated, normal study  . Hypercholesterolemia   . Hypertension   . 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 COLECTOMY WITH COLOSTOMY CREATION/HARTMANN PROCEDURE N/A 07/03/2017   Procedure: COLECTOMY WITH COLOSTOMY CREATION/HARTMANN PROCEDURE;  Surgeon: Florene Glen, MD;  Location: [facility] ORS;  Service: General;  Laterality: N/A;  . COLONOSCOPY WITH PROPOFOL N/A 10/27/2017   Procedure: COLONOSCOPY WITH PROPOFOL through colostomy;  Surgeon: Lucilla Lame, MD;  Location: [facility] END[facility];  Service: Endoscopy;  Laterality: N/A;  . COLOSTOMY CLOSURE N/A 11/17/2017   Procedure: COLOSTOMY CLOSURE;  Surgeon: Florene Glen, MD;  Location: [facility] ORS;  Service: General;  Laterality: N/A;  . CORONARY ARTERY BYPASS GRAFT  1999  . excision of skin cancer    . KNEE ARTHROSCOPY W/ MENISCAL  REPAIR       Current Meds  Medication Sig  . carvedilol (COREG) 6.25 MG tablet Take 1 tablet (6.25 mg total) by mouth 2 (two) times daily.  . COD LIVER OIL PO Take 1 capsule by mouth 3 (three) times a week.   . diclofenac sodium (VOLTAREN) 1 % GEL Apply 1 application topically daily as needed (joint pain).  Marland Kitchen ezetimibe (ZETIA) 10 MG tablet Take 10 mg by mouth  at bedtime.   . feeding supplement, ENSURE ENLIVE, (ENSURE ENLIVE) LIQD Take 237 mLs by mouth 2 (two) times daily between meals.  . fexofenadine (ALLEGRA) 180 MG tablet Take 180 mg by mouth daily as needed for allergies.   . fish oil-omega-3 fatty acids 1000 MG capsule Take 1 g by mouth 3 (three) times a week.   . nitroGLYCERIN (NITROSTAT) 0.4 MG SL tablet Place 0.4 mg under the tongue as needed for chest pain. May repeat in 15 minutes up to 3 times for chest pain.  Marland Kitchen oxyCODONE (OXY IR/ROXICODONE) 5 MG immediate release tablet Take 1-2 tablets (5-10 mg total) by mouth every 4 (four) hours as needed for moderate pain or severe pain.  . rivaroxaban (XARELTO) 20 MG TABS tablet Take 1 tablet (20 mg total) by mouth daily with supper.  . simvastatin (ZOCOR) 10 MG tablet Take 10 mg by mouth at bedtime.  Marland Kitchen UNABLE TO FIND 1 tablet as needed. Med Name: cardio-plus  . [DISCONTINUED] carvedilol (COREG) 6.25 MG tablet Take 1 tablet (6.25 mg total) by mouth 2 (two) times daily.  . [DISCONTINUED] naproxen sodium (ALEVE) 220 MG tablet Take 220 mg by mouth daily as needed (pain).     Allergies:   Shellfish allergy   Social History   Tobacco Use  . Smoking status: Never Smoker  . Smokeless tobacco: Former Systems developer    Types: Chew  Substance Use Topics  . Alcohol use: No    Alcohol/week: 0.0 standard drinks  . Drug use: No     Family Hx: The patient's family history includes Lung disease (age of onset: 45) in his father.  ROS:   Please see the history of present illness.     All other systems reviewed and are negative.   Prior CV studies:   The following studies were reviewed today:  Myoview 04/11/16: Study Highlights    Nuclear stress EF: 54%. The LV systolic function is normal  There was no ST segment deviation noted during stress.  The study is normal. no ischemia. No infarction  This is a low risk study.      Labs/Other Tests and Data Reviewed:    EKG:  No ECG reviewed.  Recent  Labs: No results found for requested labs within last 8760 hours.   Recent Lipid Panel Lab Results  Component Value Date/Time   CHOL 125 01/23/2014 09:42 AM   TRIG 64 07/13/2017 06:14 AM   HDL 37.10 (L) 01/23/2014 09:42 AM   CHOLHDL 3 01/23/2014 09:42 AM   LDLCALC 74 01/23/2014 09:42 AM   Labs dated 06/02/19: cholesterol 154, triglycerides 106, HDL 47, LDL 88. CBC and CMET normal  Wt Readings from Last 3 Encounters:  06/28/19 220 lb (99.8 kg)  11/29/18 224 lb 4 oz (101.7 kg)  06/17/18 209 lb 6.4 oz (95 kg)     Objective:    Vital Signs:  BP 127/81   Pulse (!) 58   Temp (!) 96 F (35.6 C)   Ht 5\' 9"  (1.753 m)   Wt 220 lb (  99.8 kg)   BMI 32.49 kg/m    VITAL SIGNS:  reviewed  ASSESSMENT & PLAN:    1. CAD - remote CABG in 1999 - normal Myoview in July 2017. Patient has class 1 symptoms- stable angina. Continue risk factor modification. Continue Coreg.    2. Atrial fibrillation - permanent. Rate control is excellent on Coreg. He is asymptomatic. Continue anticoagulation with Xarelto. Instructed to avoid NSAIDs on Xarelto.   3. HTN -  Well Controlled on Coreg and spironolactone.   4. HLD - on statin Zetia, and fish oil. LDL 88. I have instructed him to try and take simvastatin daily now.   5. Obesity.encouraged weight loss   COVID-19 Education: The signs and symptoms of COVID-19 were discussed with the patient and how to seek care for testing (follow up with PCP or arrange E-visit).  The importance of social distancing was discussed today.  Time:   Today, I have spent 10  minutes with the patient with telehealth technology discussing the above problems.     Medication Adjustments/Labs and Tests Ordered: Current medicines are reviewed at length with the patient today.  Concerns regarding medicines are outlined above.   Tests Ordered: No orders of the defined types were placed in this encounter.   Medication Changes: Meds ordered this encounter  Medications  .  carvedilol (COREG) 6.25 MG tablet    Sig: Take 1 tablet (6.25 mg total) by mouth 2 (two) times daily.    Dispense:  180 tablet    Refill:  3  . spironolactone (ALDACTONE) 25 MG tablet    Sig: Take 0.5 tablets (12.5 mg total) by mouth daily.    Dispense:  45 tablet    Refill:  3    Follow Up:  In Person in 6 month(s)  Signed, Raevon Broom [name], MD  06/28/2019 8:09 AM    [place]

## 2019-06-28 ENCOUNTER — Encounter: Payer: Self-pay | Admitting: Cardiology

## 2019-06-28 ENCOUNTER — Telehealth (INDEPENDENT_AMBULATORY_CARE_PROVIDER_SITE_OTHER): Payer: PPO | Admitting: Cardiology

## 2019-06-28 VITALS — BP 127/81 | HR 58 | Temp 96.0°F | Ht 69.0 in | Wt 220.0 lb

## 2019-06-28 DIAGNOSIS — I10 Essential (primary) hypertension: Secondary | ICD-10-CM

## 2019-06-28 DIAGNOSIS — I25708 Atherosclerosis of coronary artery bypass graft(s), unspecified, with other forms of angina pectoris: Secondary | ICD-10-CM | POA: Diagnosis not present

## 2019-06-28 DIAGNOSIS — I482 Chronic atrial fibrillation, unspecified: Secondary | ICD-10-CM

## 2019-06-28 DIAGNOSIS — E7800 Pure hypercholesterolemia, unspecified: Secondary | ICD-10-CM

## 2019-06-28 MED ORDER — CARVEDILOL 6.25 MG PO TABS: 6.2500 mg | ORAL_TABLET | Freq: Two times a day (BID) | ORAL | 3 refills | Status: DC

## 2019-06-28 MED ORDER — SPIRONOLACTONE 25 MG PO TABS: 12.5000 mg | ORAL_TABLET | Freq: Every day | ORAL | 3 refills | Status: DC

## 2019-06-28 NOTE — Patient Instructions (Addendum)
Increase simvastatin to 10 mg every day  Continue other therapy  Follow up 6 months   Call in Jan to schedule a March appointment

## 2019-10-03 DIAGNOSIS — Z1159 Encounter for screening for other viral diseases: Secondary | ICD-10-CM | POA: Diagnosis not present

## 2019-10-03 DIAGNOSIS — Z7901 Long term (current) use of anticoagulants: Secondary | ICD-10-CM | POA: Diagnosis not present

## 2019-10-03 DIAGNOSIS — R7303 Prediabetes: Secondary | ICD-10-CM | POA: Diagnosis not present

## 2019-10-03 DIAGNOSIS — I10 Essential (primary) hypertension: Secondary | ICD-10-CM | POA: Diagnosis not present

## 2019-10-03 DIAGNOSIS — I2510 Atherosclerotic heart disease of native coronary artery without angina pectoris: Secondary | ICD-10-CM | POA: Diagnosis not present

## 2019-10-03 DIAGNOSIS — E782 Mixed hyperlipidemia: Secondary | ICD-10-CM | POA: Diagnosis not present

## 2019-10-03 DIAGNOSIS — Z6834 Body mass index (BMI) 34.0-34.9, adult: Secondary | ICD-10-CM | POA: Diagnosis not present

## 2019-10-04 DIAGNOSIS — E782 Mixed hyperlipidemia: Secondary | ICD-10-CM | POA: Diagnosis not present

## 2019-10-04 DIAGNOSIS — I10 Essential (primary) hypertension: Secondary | ICD-10-CM | POA: Diagnosis not present

## 2019-10-04 DIAGNOSIS — R7303 Prediabetes: Secondary | ICD-10-CM | POA: Diagnosis not present

## 2019-10-26 DIAGNOSIS — H25041 Posterior subcapsular polar age-related cataract, right eye: Secondary | ICD-10-CM | POA: Diagnosis not present

## 2019-12-02 ENCOUNTER — Telehealth: Payer: Self-pay

## 2019-12-02 NOTE — Telephone Encounter (Signed)

## 2019-12-03 NOTE — Progress Notes (Signed)
Virtual Visit via Telephone Note   This visit type was conducted due to national recommendations for restrictions regarding the COVID-19 Pandemic (e.g. social distancing) in an effort to limit this patient's exposure and mitigate transmission in our community.  Due to his co-morbid illnesses, this patient is at least at moderate risk for complications without adequate follow up.  This format is felt to be most appropriate for this patient at this time.  The patient did not have access to video technology/had technical difficulties with video requiring transitioning to audio format only (telephone).  All issues noted in this document were discussed and addressed.  No physical exam could be performed with this format.  Please refer to the patient's chart for his  consent to telehealth for [facility].   The patient was identified using 2 identifiers.  Date:  12/05/2019   ID:  Ryan Mccall, DOB 1945-12-18, MRN [id number]  Patient Location: Home Provider Location: Home  PCP:  Lillard Anes, MD  Cardiologist:  [name], MD  Electrophysiologist:  None   Evaluation Performed:  Follow-Up Visit  Chief Complaint:  Routine follow-up  History of Present Illness:    Ryan Mccall is a 74 y.o. male with a PMH of CAD s/p CAG in 1999, persistent atrial fibrillation, HTN, and HLD, who presents for routine follow-up.   He was last evaluated by cardiology via a telemedicine visit with Dr. [name] 06/28/2019, at which time he was doing well from a cardiac standpoint with no anginal complaints. His simvastatin was increased from QOD to daily in an effort to get his LDL to goal of <70, otherwise he was recommended to follow-up in 6 months. His last ischemic evaluation was a NST in 2017 which showed EF 54% and no ischemia.   He has done well over the past 6 months. He continues to stay active, walking when weather permits and is gearing up for lawn mowing season. He denies any chest pain, SOB, or  DOE. He denies any racing heart beat sensations. No hematuria, hematochezia, or melena. No complaints of dizziness, lightheadedness, syncope, orthopnea, PND, or LE edema. He checks his BP twice a day, usually runs in the 110s-120s/70s-80s. He was encouraged to get the COVID-19 vaccine - he has been hesitant since he's heard some negative side effects and he was fortunate to have a very mild case of COVID-19 back in the summer 2020. We discussed the importance of getting vaccinated given his PMH.   The patient does not have symptoms concerning for COVID-19 infection (fever, chills, cough, or new shortness of breath).    Past Medical History:  Diagnosis Date  . Atrial fibrillation ([place])   . Coronary artery disease    s/p remote CABG 1999 per Dr. Cyndia Bent;  Lexiscan Myoview (01/2014):  No ischemia, not gated, normal study  . Hypercholesterolemia   . Hypertension   . 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 COLECTOMY WITH COLOSTOMY CREATION/HARTMANN PROCEDURE N/A 07/03/2017   Procedure: COLECTOMY WITH COLOSTOMY CREATION/HARTMANN PROCEDURE;  Surgeon: Florene Glen, MD;  Location: [facility] ORS;  Service: General;  Laterality: N/A;  . COLONOSCOPY WITH PROPOFOL N/A 10/27/2017   Procedure: COLONOSCOPY WITH PROPOFOL through colostomy;  Surgeon: Lucilla Lame, MD;  Location: [facility] ENDOSCOPY;  Service: Endoscopy;  Laterality: N/A;  . COLOSTOMY CLOSURE N/A 11/17/2017   Procedure: COLOSTOMY CLOSURE;  Surgeon: Florene Glen, MD;  Location: [facility] ORS;  Service: General;  Laterality: N/A;  . CORONARY ARTERY BYPASS GRAFT  1999  . excision of skin cancer    . KNEE ARTHROSCOPY W/ MENISCAL REPAIR       Current Meds  Medication Sig  . carvedilol (COREG) 6.25 MG tablet Take 1 tablet (6.25 mg total) by mouth 2 (two) times daily.  . COD LIVER OIL PO Take 1 capsule by mouth 3 (three) times a week.   . diclofenac sodium (VOLTAREN) 1 % GEL Apply 1  application topically daily as needed (joint pain).  Marland Kitchen ezetimibe (ZETIA) 10 MG tablet Take 10 mg by mouth at bedtime.   . feeding supplement, ENSURE ENLIVE, (ENSURE ENLIVE) LIQD Take 237 mLs by mouth 2 (two) times daily between meals. (Patient taking differently: Take 237 mLs by mouth daily. )  . fexofenadine (ALLEGRA) 180 MG tablet Take 180 mg by mouth daily as needed for allergies.   . fish oil-omega-3 fatty acids 1000 MG capsule Take 1 g by mouth 3 (three) times a week.   . rivaroxaban (XARELTO) 20 MG TABS tablet Take 1 tablet (20 mg total) by mouth daily with supper.  . simvastatin (ZOCOR) 10 MG tablet Take 10 mg by mouth at bedtime.  Marland Kitchen spironolactone (ALDACTONE) 25 MG tablet Take 0.5 tablets (12.5 mg total) by mouth daily.  Marland Kitchen UNABLE TO FIND 1 tablet as needed. Med Name: cardio-plus     Allergies:   Shellfish allergy   Social History   Tobacco Use  . Smoking status: Never Smoker  . Smokeless tobacco: Former Systems developer    Types: Chew  Substance Use Topics  . Alcohol use: No    Alcohol/week: 0.0 standard drinks  . Drug use: No     Family Hx: The patient's family history includes Lung disease (age of onset: 35) in his father.  ROS:   Please see the history of present illness.     All other systems reviewed and are negative.   Prior CV studies:   The following studies were reviewed today:  Myoview 04/11/16: Study Highlights    Nuclear stress EF: 54%. The LV systolic function is normal  There was no ST segment deviation noted during stress.  The study is normal. no ischemia. No infarction  This is a low risk study.   Echocardiogram 2014: - Left ventricle: The cavity size was normal. Wall thickness  was normal. The estimated ejection fraction was 55%. Wall  motion was normal; there were no regional wall motion  abnormalities. Left ventricular diastolic function  parameters were normal.  - Aortic valve: Trivial regurgitation.  - Mitral valve: Mild regurgitation.    - Left atrium: The atrium was moderately dilated.  - Atrial septum: No defect or patent foramen ovale was  identified.  - Pulmonary arteries: PA peak pressure: 60mm Hg (S).    Labs/Other Tests and Data Reviewed:    EKG:  An ECG dated 12/17/2018 was personally reviewed today and demonstrated:  atrial fibrillation with rate 67 bpm, isolated TWI in aVL, no STE/D; no significant change from previous  Recent Labs: No results found for requested labs within last 8760 hours.   Recent Lipid Panel Lab Results  Component Value Date/Time   CHOL 125 01/23/2014 09:42 AM   TRIG 64 07/13/2017 06:14 AM   HDL 37.10 (L) 01/23/2014 09:42 AM   CHOLHDL 3 01/23/2014 09:42 AM   LDLCALC 74 01/23/2014 09:42 AM    Wt Readings from Last 3 Encounters:  12/05/19 230 lb (104.3 kg)  06/28/19 220  lb (99.8 kg)  11/29/18 224 lb 4 oz (101.7 kg)     Objective:    Vital Signs:  BP 126/83   Pulse 67   Temp (!) 96.5 F (35.8 C) Comment: Forehead  Ht 5\' 9"  (1.753 m)   Wt 230 lb (104.3 kg)   BMI 33.97 kg/m    VITAL SIGNS:  reviewed GEN:  no acute distress RESPIRATORY:  speaking in full sentences without SOB CARDIOVASCULAR:  no peripheral edema NEURO:  A&O x3 PSYCH:  normal affect  ASSESSMENT & PLAN:    1. CAD s/p CABG in 1999: no anginal complaints. Not on aspirin given need for anticoagulation. - Continue simvastatin, zetia, and omega 3 - Continue carvedilol  2. Persistent atrial fibrillation: rate well controlled and no complaints of tachy-palpitations. No bleeding on xarelto  - Continue carvedilol for rate control - Continue xarelto for stroke ppx  3. HTN: BP 126/83 today - Continue carvedilol and spironolactone  4. HLD: LDL 56 09/2019; monitored by PCP - Continue simvastatin, zetia, and omega 3   COVID-19 Education: The signs and symptoms of COVID-19 were discussed with the patient and how to seek care for testing (follow up with PCP or arrange E-visit).  The importance of social  distancing was discussed today.  Time:   Today, I have spent 9 minutes with the patient with telehealth technology discussing the above problems.     Medication Adjustments/Labs and Tests Ordered: Current medicines are reviewed at length with the patient today.  Concerns regarding medicines are outlined above.   Tests Ordered: No orders of the defined types were placed in this encounter.   Medication Changes: No orders of the defined types were placed in this encounter.   Follow Up:  In Person with Dr. [name] in 6 months.  Signed, Abigail Butts, PA-C  12/05/2019 8:40 AM    [place]

## 2019-12-05 ENCOUNTER — Telehealth (INDEPENDENT_AMBULATORY_CARE_PROVIDER_SITE_OTHER): Payer: PPO | Admitting: Medical

## 2019-12-05 ENCOUNTER — Encounter: Payer: Self-pay | Admitting: Medical

## 2019-12-05 ENCOUNTER — Telehealth: Payer: Self-pay

## 2019-12-05 VITALS — BP 126/83 | HR 67 | Temp 96.5°F | Ht 69.0 in | Wt 230.0 lb

## 2019-12-05 DIAGNOSIS — I25708 Atherosclerosis of coronary artery bypass graft(s), unspecified, with other forms of angina pectoris: Secondary | ICD-10-CM

## 2019-12-05 DIAGNOSIS — Z7901 Long term (current) use of anticoagulants: Secondary | ICD-10-CM

## 2019-12-05 DIAGNOSIS — E7800 Pure hypercholesterolemia, unspecified: Secondary | ICD-10-CM

## 2019-12-05 DIAGNOSIS — I10 Essential (primary) hypertension: Secondary | ICD-10-CM

## 2019-12-05 DIAGNOSIS — I482 Chronic atrial fibrillation, unspecified: Secondary | ICD-10-CM

## 2019-12-05 NOTE — Telephone Encounter (Signed)
Called patient to discuss AVS instructions gave Krista Kroeger's recommendations and patient voiced understanding. AVS summary mailed to patient.    

## 2019-12-05 NOTE — Patient Instructions (Signed)
Medication Instructions:  Your physician recommends that you continue on your current medications as directed. Please refer to the Current Medication list given to you today.  *If you need a refill on your cardiac medications before your next appointment, please call your pharmacy*  Lab Work: NONE ordered at this time of appointment   If you have labs (blood work) drawn today and your tests are completely normal, you will receive your results only by: . MyChart Message (if you have MyChart) OR . A paper copy in the mail If you have any lab test that is abnormal or we need to change your treatment, we will call you to review the results.  Testing/Procedures: NONE ordered at this time of appointment   Follow-Up: At [facility], you and your health needs are our priority.  As part of our continuing mission to provide you with exceptional heart care, we have created designated Provider Care Teams.  These Care Teams include your primary Cardiologist (physician) and Advanced Practice Providers (APPs -  Physician Assistants and Nurse Practitioners) who all work together to provide you with the care you need, when you need it.  We recommend signing up for the patient portal called "MyChart".  Sign up information is provided on this After Visit Summary.  MyChart is used to connect with patients for Virtual Visits (Telemedicine).  Patients are able to view lab/test results, encounter notes, upcoming appointments, etc.  Non-urgent messages can be sent to your provider as well.   To learn more about what you can do with MyChart, go to [email or web address].    Your next appointment:   6 month(s)  The format for your next appointment:   In Person  Provider:   [name] Jordan, MD  Other Instructions   

## 2019-12-20 ENCOUNTER — Other Ambulatory Visit: Payer: Self-pay

## 2019-12-20 MED ORDER — EZETIMIBE 10 MG PO TABS: 10.0000 mg | ORAL_TABLET | Freq: Every day | ORAL | 1 refills | Status: DC

## 2019-12-20 MED ORDER — SIMVASTATIN 10 MG PO TABS: 10.0000 mg | ORAL_TABLET | Freq: Every day | ORAL | 1 refills | Status: DC

## 2019-12-21 ENCOUNTER — Ambulatory Visit (INDEPENDENT_AMBULATORY_CARE_PROVIDER_SITE_OTHER): Payer: PPO | Admitting: Legal Medicine

## 2019-12-21 ENCOUNTER — Encounter: Payer: Self-pay | Admitting: Legal Medicine

## 2019-12-21 ENCOUNTER — Other Ambulatory Visit: Payer: Self-pay

## 2019-12-21 VITALS — BP 130/80 | HR 74 | Temp 98.2°F | Resp 17 | Ht 69.0 in | Wt 238.0 lb

## 2019-12-21 DIAGNOSIS — L97921 Non-pressure chronic ulcer of unspecified part of left lower leg limited to breakdown of skin: Secondary | ICD-10-CM | POA: Diagnosis not present

## 2019-12-21 DIAGNOSIS — L02416 Cutaneous abscess of left lower limb: Secondary | ICD-10-CM

## 2019-12-21 DIAGNOSIS — L03116 Cellulitis of left lower limb: Secondary | ICD-10-CM | POA: Diagnosis not present

## 2019-12-21 MED ORDER — SULFAMETHOXAZOLE-TRIMETHOPRIM 400-80 MG PO TABS: 1.0000 | ORAL_TABLET | Freq: Two times a day (BID) | ORAL | 1 refills | Status: DC

## 2019-12-21 NOTE — Patient Instructions (Signed)
Wound Care, Adult Taking care of your wound properly can help to prevent pain, infection, and scarring. It can also help your wound to heal more quickly. How to care for your wound Wound care      Follow instructions from your health care provider about how to take care of your wound. Make sure you: ? Wash your hands with soap and water before you change the bandage (dressing). If soap and water are not available, use hand sanitizer. ? Change your dressing as told by your health care provider. ? Leave stitches (sutures), skin glue, or adhesive strips in place. These skin closures may need to stay in place for 2 weeks or longer. If adhesive strip edges start to loosen and curl up, you may trim the loose edges. Do not remove adhesive strips completely unless your health care provider tells you to do that.  Check your wound area every day for signs of infection. Check for: ? Redness, swelling, or pain. ? Fluid or blood. ? Warmth. ? Pus or a bad smell.  Ask your health care provider if you should clean the wound with mild soap and water. Doing this may include: ? Using a clean towel to pat the wound dry after cleaning it. Do not rub or scrub the wound. ? Applying a cream or ointment. Do this only as told by your health care provider. ? Covering the incision with a clean dressing.  Ask your health care provider when you can leave the wound uncovered.  Keep the dressing dry until your health care provider says it can be removed. Do not take baths, swim, use a hot tub, or do anything that would put the wound underwater until your health care provider approves. Ask your health care provider if you can take showers. You may only be allowed to take sponge baths. Medicines   If you were prescribed an antibiotic medicine, cream, or ointment, take or use the antibiotic as told by your health care provider. Do not stop taking or using the antibiotic even if your condition improves.  Take  over-the-counter and prescription medicines only as told by your health care provider. If you were prescribed pain medicine, take it 30 or more minutes before you do any wound care or as told by your health care provider. General instructions  Return to your normal activities as told by your health care provider. Ask your health care provider what activities are safe.  Do not scratch or pick at the wound.  Do not use any products that contain nicotine or tobacco, such as cigarettes and e-cigarettes. These may delay wound healing. If you need help quitting, ask your health care provider.  Keep all follow-up visits as told by your health care provider. This is important.  Eat a diet that includes protein, vitamin A, vitamin C, and other nutrient-rich foods to help the wound heal. ? Foods rich in protein include meat, dairy, beans, nuts, and other sources. ? Foods rich in vitamin A include carrots and dark green, leafy vegetables. ? Foods rich in vitamin C include citrus, tomatoes, and other fruits and vegetables. ? Nutrient-rich foods have protein, carbohydrates, fat, vitamins, or minerals. Eat a variety of healthy foods including vegetables, fruits, and whole grains. Contact a health care provider if:  You received a tetanus shot and you have swelling, severe pain, redness, or bleeding at the injection site.  Your pain is not controlled with medicine.  You have redness, swelling, or pain around the wound.    You have fluid or blood coming from the wound.  Your wound feels warm to the touch.  You have pus or a bad smell coming from the wound.  You have a fever or chills.  You are nauseous or you vomit.  You are dizzy. Get help right away if:  You have a red streak going away from your wound.  The edges of the wound open up and separate.  Your wound is bleeding, and the bleeding does not stop with gentle pressure.  You have a rash.  You faint.  You have trouble  breathing. Summary  Always wash your hands with soap and water before changing your bandage (dressing).  To help with healing, eat foods that are rich in protein, vitamin A, vitamin C, and other nutrients.  Check your wound every day for signs of infection. Contact your health care provider if you suspect that your wound is infected. This information is not intended to replace advice given to you by your health care provider. Make sure you discuss any questions you have with your health care provider. Document Revised: 01/03/2019 Document Reviewed: 04/01/2016 Elsevier Patient Education  2020 Elsevier Inc.  

## 2019-12-21 NOTE — Assessment & Plan Note (Signed)
The ulcer was cultured and then wrapped with compression dressing.  I started bactrim bid.  Follow up bid

## 2019-12-21 NOTE — Progress Notes (Signed)
Acute Office Visit  Subjective:    Patient ID: Ryan Mccall, male    DOB: 11-Dec-1945, 74 y.o.   MRN: [id number]  Chief Complaint  Patient presents with  . Cellulitis    Left leg redness and swelling.    HPI Patient is in today for cellulitis left leg.  He scraped left leg on tractor tire 2 weeks ago.  It is nor healing and is weeping.  The leg is swollen and red.  Some edema is present .  No fever or SOB.  Past Medical History:  Diagnosis Date  . Atrial fibrillation ([place])   . Coronary artery disease    s/p remote CABG 1999 per Dr. Cyndia Bent;  Lexiscan Myoview (01/2014):  No ischemia, not gated, normal study  . Hypercholesterolemia   . Hypertension   . 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 COLECTOMY WITH COLOSTOMY CREATION/HARTMANN PROCEDURE N/A 07/03/2017   Procedure: COLECTOMY WITH COLOSTOMY CREATION/HARTMANN PROCEDURE;  Surgeon: Florene Glen, MD;  Location: [facility] ORS;  Service: General;  Laterality: N/A;  . COLONOSCOPY WITH PROPOFOL N/A 10/27/2017   Procedure: COLONOSCOPY WITH PROPOFOL through colostomy;  Surgeon: Lucilla Lame, MD;  Location: [facility] ENDOSCOPY;  Service: Endoscopy;  Laterality: N/A;  . COLOSTOMY CLOSURE N/A 11/17/2017   Procedure: COLOSTOMY CLOSURE;  Surgeon: Florene Glen, MD;  Location: [facility] ORS;  Service: General;  Laterality: N/A;  . CORONARY ARTERY BYPASS GRAFT  1999  . excision of skin cancer    . KNEE ARTHROSCOPY W/ MENISCAL REPAIR      Family History  Problem Relation Age of Onset  . Lung disease Father 19    Social History   Socioeconomic History  . Marital status: Married    Spouse name: Not on file  . Number of children: 2  . Years of education: Not on file  . Highest education level: Not on file  Occupational History  . Occupation: Print production planner  Tobacco Use  . Smoking status: Former Smoker    Types: Cigars    Quit date: 09/29/1997    Years since  quitting: 22.2  . Smokeless tobacco: Former Systems developer    Types: Chew    Quit date: 1999  Substance and Sexual Activity  . Alcohol use: Yes    Alcohol/week: 1.0 standard drinks    Types: 1 Shots of liquor per week    Comment: one time monthly  . Drug use: No  . Sexual activity: Yes    Partners: Female  Other Topics Concern  . Not on file  Social History Narrative  . Not on file   Social Determinants of Health   Financial Resource Strain:   . Difficulty of Paying Living Expenses:   Food Insecurity:   . Worried About Charity fundraiser in the Last Year:   . Arboriculturist in the Last Year:   Transportation Needs:   . Film/video editor (Medical):   Marland Kitchen Lack of Transportation (Non-Medical):   Physical Activity:   . Days of Exercise per Week:   . Minutes of Exercise per Session:   Stress:   . Feeling of Stress :   Social Connections:   . Frequency of Communication with Friends and Family:   . Frequency of Social Gatherings with Friends and Family:   . Attends Religious Services:   . Active Member of Clubs or Organizations:   . Attends Club  or Organization Meetings:   Marland Kitchen Marital Status:   Intimate Partner Violence:   . Fear of Current or Ex-Partner:   . Emotionally Abused:   Marland Kitchen Physically Abused:   . Sexually Abused:     Outpatient Medications Prior to Visit  Medication Sig Dispense Refill  . carvedilol (COREG) 6.25 MG tablet Take 1 tablet (6.25 mg total) by mouth 2 (two) times daily. 180 tablet 3  . clotrimazole-betamethasone (LOTRISONE) cream     . COD LIVER OIL PO Take 1 capsule by mouth 3 (three) times a week.     . diclofenac sodium (VOLTAREN) 1 % GEL Apply 1 application topically daily as needed (joint pain).    Marland Kitchen ezetimibe (ZETIA) 10 MG tablet Take 1 tablet (10 mg total) by mouth at bedtime. 90 tablet 1  . feeding supplement, ENSURE ENLIVE, (ENSURE ENLIVE) LIQD Take 237 mLs by mouth 2 (two) times daily between meals. (Patient taking differently: Take 237 mLs by  mouth daily. ) 237 mL 12  . fexofenadine (ALLEGRA) 180 MG tablet Take 180 mg by mouth daily as needed for allergies.     . fish oil-omega-3 fatty acids 1000 MG capsule Take 1 g by mouth 3 (three) times a week.     . nitroGLYCERIN (NITROSTAT) 0.4 MG SL tablet Place 0.4 mg under the tongue as needed for chest pain. May repeat in 15 minutes up to 3 times for chest pain.  2  . rivaroxaban (XARELTO) 20 MG TABS tablet Take 1 tablet (20 mg total) by mouth daily with supper. 90 tablet 3  . simvastatin (ZOCOR) 10 MG tablet Take 1 tablet (10 mg total) by mouth at bedtime. 90 tablet 1  . spironolactone (ALDACTONE) 25 MG tablet Take 0.5 tablets (12.5 mg total) by mouth daily. 45 tablet 3  . UNABLE TO FIND 1 tablet as needed. Med Name: cardio-plus     No facility-administered medications prior to visit.    Allergies  Allergen Reactions  . Shellfish Allergy Nausea And Vomiting    Review of Systems  Constitutional: Negative.   HENT: Negative.   Eyes: Negative.   Respiratory: Negative.   Cardiovascular: Negative.   Gastrointestinal: Negative.   Endocrine: Negative.   Genitourinary: Negative.   Musculoskeletal: Negative.   Skin: Negative.   Neurological: Negative.   Hematological: Negative.   Psychiatric/Behavioral: Negative.        Objective:    Physical Exam Constitutional:      Appearance: Normal appearance.  Cardiovascular:     Rate and Rhythm: Normal rate and regular rhythm.     Pulses: Normal pulses.     Heart sounds: Normal heart sounds.  Pulmonary:     Effort: Pulmonary effort is normal.     Breath sounds: Normal breath sounds.  Skin:         Comments: Ulcer left lateral leg.  Draining, there is surrounding redness.  Neurological:     Mental Status: He is alert.     BP 130/80 (BP Location: Right Arm, Patient Position: Sitting)   Pulse 74   Temp 98.2 F (36.8 C) (Temporal)   Resp 17   Ht 5\' 9"  (1.753 m)   Wt 238 lb (108 kg)   BMI 35.15 kg/m  Wt Readings from Last  3 Encounters:  12/21/19 238 lb (108 kg)  12/05/19 230 lb (104.3 kg)  06/28/19 220 lb (99.8 kg)    Health Maintenance Due  Topic Date Due  . Hepatitis C Screening  Never done  . TETANUS/TDAP  Never done  . PNA vac Low Risk Adult (1 of 2 - PCV13) Never done    There are no preventive care reminders to display for this patient.   Lab Results  Component Value Date   TSH 3.48 04/08/2013   Lab Results  Component Value Date   WBC 7.1 11/21/2017   HGB 12.1 (L) 11/21/2017   HCT 35.5 (L) 11/21/2017   MCV 84.8 11/21/2017   PLT 174 11/21/2017   Lab Results  Component Value Date   NA 132 (L) 11/23/2017   K 3.5 11/25/2017   CO2 26 11/23/2017   GLUCOSE 112 (H) 11/23/2017   BUN 12 11/23/2017   CREATININE 0.73 11/23/2017   BILITOT 1.2 11/18/2017   ALKPHOS 37 (L) 11/18/2017   AST 39 11/18/2017   ALT 20 11/18/2017   PROT 6.3 (L) 11/18/2017   ALBUMIN 3.3 (L) 11/18/2017   CALCIUM 7.9 (L) 11/23/2017   ANIONGAP 6 11/23/2017   GFR 98.27 01/23/2014   Lab Results  Component Value Date   CHOL 125 01/23/2014   Lab Results  Component Value Date   HDL 37.10 (L) 01/23/2014   Lab Results  Component Value Date   LDLCALC 74 01/23/2014   Lab Results  Component Value Date   TRIG 64 07/13/2017   Lab Results  Component Value Date   CHOLHDL 3 01/23/2014   No results found for: HGBA1C     Assessment & Plan:   Problem List Items Addressed This Visit      Other   Chronic ulcer of left leg (HCC)    The ulcer was cultured and then wrapped with compression dressing.  I started bactrim bid.  Follow up bid       Other Visit Diagnoses    Cellulitis and abscess of left leg    -  Primary   Relevant Medications   sulfamethoxazole-trimethoprim (BACTRIM) 400-80 MG tablet   Other Relevant Orders   WOUND CULTURE     Return in about 1 week (around 12/28/2019).  Meds ordered this encounter  Medications  . sulfamethoxazole-trimethoprim (BACTRIM) 400-80 MG tablet    Sig: Take 1 tablet  by mouth 2 (two) times daily.    Dispense:  14 tablet    Refill:  1     [name], MD

## 2019-12-24 ENCOUNTER — Other Ambulatory Visit: Payer: Self-pay | Admitting: Legal Medicine

## 2019-12-24 LAB — WOUND CULTURE: Organism ID, Bacteria: NONE SEEN

## 2019-12-24 NOTE — Progress Notes (Signed)
Patient is growing staph areus, MRSA and he is to continue bactrim, sulfameythoxazole. lp

## 2019-12-28 ENCOUNTER — Other Ambulatory Visit: Payer: Self-pay

## 2019-12-28 ENCOUNTER — Ambulatory Visit (INDEPENDENT_AMBULATORY_CARE_PROVIDER_SITE_OTHER): Payer: PPO | Admitting: Legal Medicine

## 2019-12-28 ENCOUNTER — Encounter: Payer: Self-pay | Admitting: Legal Medicine

## 2019-12-28 VITALS — BP 120/82 | HR 65 | Temp 97.8°F | Resp 16 | Ht 69.0 in | Wt 235.8 lb

## 2019-12-28 DIAGNOSIS — L03116 Cellulitis of left lower limb: Secondary | ICD-10-CM | POA: Diagnosis not present

## 2019-12-28 DIAGNOSIS — L02416 Cutaneous abscess of left lower limb: Secondary | ICD-10-CM | POA: Diagnosis not present

## 2019-12-28 DIAGNOSIS — L97921 Non-pressure chronic ulcer of unspecified part of left lower leg limited to breakdown of skin: Secondary | ICD-10-CM | POA: Diagnosis not present

## 2019-12-28 MED ORDER — SULFAMETHOXAZOLE-TRIMETHOPRIM 400-80 MG PO TABS: 1.0000 | ORAL_TABLET | Freq: Two times a day (BID) | ORAL | 1 refills | Status: DC

## 2019-12-28 NOTE — Progress Notes (Signed)
Established Patient Office Visit  Subjective:  Patient ID: Ryan Mccall, male    DOB: [birth year]  Age: 74 y.o. MRN: [id number]  CC:  Chief Complaint  Patient presents with  . Cellulitis    HPI Ryan Mccall presents for follow up of cellulitis and ulcer left leg.  It is healing and less red.    Past Medical History:  Diagnosis Date  . Atrial fibrillation ([place])   . Coronary artery disease    s/p remote CABG 1999 per Dr. Cyndia Bent;  Lexiscan Myoview (01/2014):  No ischemia, not gated, normal study  . Hypercholesterolemia   . Hypertension   . 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 COLECTOMY WITH COLOSTOMY CREATION/HARTMANN PROCEDURE N/A 07/03/2017   Procedure: COLECTOMY WITH COLOSTOMY CREATION/HARTMANN PROCEDURE;  Surgeon: Florene Glen, MD;  Location: [facility] ORS;  Service: General;  Laterality: N/A;  . COLONOSCOPY WITH PROPOFOL N/A 10/27/2017   Procedure: COLONOSCOPY WITH PROPOFOL through colostomy;  Surgeon: Lucilla Lame, MD;  Location: [facility] END[facility]COPY;  Service: Endoscopy;  Laterality: N/A;  . COLOSTOMY CLOSURE N/A 11/17/2017   Procedure: COLOSTOMY CLOSURE;  Surgeon: Florene Glen, MD;  Location: [facility] ORS;  Service: General;  Laterality: N/A;  . CORONARY ARTERY BYPASS GRAFT  1999  . excision of skin cancer    . KNEE ARTHROSCOPY W/ MENISCAL REPAIR      Family History  Problem Relation Age of Onset  . Lung disease Father 41    Social History   Socioeconomic History  . Marital status: Married    Spouse name: Not on file  . Number of children: 2  . Years of education: Not on file  . Highest education level: Not on file  Occupational History  . Occupation: Print production planner  Tobacco Use  . Smoking status: Former Smoker    Types: Cigars    Quit date: 09/29/1997    Years since quitting: 22.2  . Smokeless tobacco: Former Systems developer    Types: Chew    Quit date: 1999  Substance and Sexual  Activity  . Alcohol use: Yes    Alcohol/week: 1.0 standard drinks    Types: 1 Shots of liquor per week    Comment: one time monthly  . Drug use: No  . Sexual activity: Yes    Partners: Female  Other Topics Concern  . Not on file  Social History Narrative  . Not on file   Social Determinants of Health   Financial Resource Strain:   . Difficulty of Paying Living Expenses:   Food Insecurity:   . Worried About Charity fundraiser in the Last Year:   . Arboriculturist in the Last Year:   Transportation Needs:   . Film/video editor (Medical):   Marland Kitchen Lack of Transportation (Non-Medical):   Physical Activity:   . Days of Exercise per Week:   . Minutes of Exercise per Session:   Stress:   . Feeling of Stress :   Social Connections:   . Frequency of Communication with Friends and Family:   . Frequency of Social Gatherings with Friends and Family:   . Attends Religious Services:   . Active Member of Clubs or Organizations:   . Attends Archivist Meetings:   Marland Kitchen Marital Status:   Intimate Partner Violence:   . Fear of Current or Ex-Partner:   . Emotionally Abused:   Marland Kitchen Physically  Abused:   . Sexually Abused:     Outpatient Medications Prior to Visit  Medication Sig Dispense Refill  . carvedilol (COREG) 6.25 MG tablet Take 1 tablet (6.25 mg total) by mouth 2 (two) times daily. 180 tablet 3  . clotrimazole-betamethasone (LOTRISONE) cream     . COD LIVER OIL PO Take 1 capsule by mouth 3 (three) times a week.     . diclofenac sodium (VOLTAREN) 1 % GEL Apply 1 application topically daily as needed (joint pain).    Marland Kitchen ezetimibe (ZETIA) 10 MG tablet Take 1 tablet (10 mg total) by mouth at bedtime. 90 tablet 1  . feeding supplement, ENSURE ENLIVE, (ENSURE ENLIVE) LIQD Take 237 mLs by mouth 2 (two) times daily between meals. (Patient taking differently: Take 237 mLs by mouth daily. ) 237 mL 12  . fexofenadine (ALLEGRA) 180 MG tablet Take 180 mg by mouth daily as needed for  allergies.     . fish oil-omega-3 fatty acids 1000 MG capsule Take 1 g by mouth 3 (three) times a week.     . nitroGLYCERIN (NITROSTAT) 0.4 MG SL tablet Place 0.4 mg under the tongue as needed for chest pain. May repeat in 15 minutes up to 3 times for chest pain.  2  . rivaroxaban (XARELTO) 20 MG TABS tablet Take 1 tablet (20 mg total) by mouth daily with supper. 90 tablet 3  . simvastatin (ZOCOR) 10 MG tablet Take 1 tablet (10 mg total) by mouth at bedtime. 90 tablet 1  . spironolactone (ALDACTONE) 25 MG tablet Take 0.5 tablets (12.5 mg total) by mouth daily. 45 tablet 3  . UNABLE TO FIND 1 tablet as needed. Med Name: cardio-plus    . sulfamethoxazole-trimethoprim (BACTRIM) 400-80 MG tablet Take 1 tablet by mouth 2 (two) times daily. 14 tablet 1   No facility-administered medications prior to visit.    Allergies  Allergen Reactions  . Shellfish Allergy Nausea And Vomiting    ROS Review of Systems  Constitutional: Negative.   HENT: Negative.   Eyes: Negative.   Respiratory: Negative.   Cardiovascular: Negative.   Genitourinary: Negative.   Musculoskeletal: Negative.   Skin: Positive for wound.  Neurological: Negative.       Objective:    Physical Exam  Constitutional: He appears well-developed and well-nourished.  Cardiovascular: Normal rate, regular rhythm and normal heart sounds.  Pulmonary/Chest: Effort normal and breath sounds normal.  Skin:     Ulcer 76mm by 1 cm healing well, less redness  Vitals reviewed.   BP 120/82   Pulse 65   Temp 97.8 F (36.6 C)   Resp 16   Ht 5\' 9"  (1.753 m)   Wt 235 lb 12.8 oz (107 kg)   SpO2 96%   BMI 34.82 kg/m  Wt Readings from Last 3 Encounters:  12/28/19 235 lb 12.8 oz (107 kg)  12/21/19 238 lb (108 kg)  12/05/19 230 lb (104.3 kg)     Health Maintenance Due  Topic Date Due  . Hepatitis C Screening  Never done  . TETANUS/TDAP  Never done  . PNA vac Low Risk Adult (1 of 2 - PCV13) Never done    There are no  preventive care reminders to display for this patient.  Lab Results  Component Value Date   TSH 3.48 04/08/2013   Lab Results  Component Value Date   WBC 7.1 11/21/2017   HGB 12.1 (L) 11/21/2017   HCT 35.5 (L) 11/21/2017   MCV 84.8 11/21/2017  PLT 174 11/21/2017   Lab Results  Component Value Date   NA 132 (L) 11/23/2017   K 3.5 11/25/2017   CO2 26 11/23/2017   GLUCOSE 112 (H) 11/23/2017   BUN 12 11/23/2017   CREATININE 0.73 11/23/2017   BILITOT 1.2 11/18/2017   ALKPHOS 37 (L) 11/18/2017   AST 39 11/18/2017   ALT 20 11/18/2017   PROT 6.3 (L) 11/18/2017   ALBUMIN 3.3 (L) 11/18/2017   CALCIUM 7.9 (L) 11/23/2017   ANIONGAP 6 11/23/2017   GFR 98.27 01/23/2014   Lab Results  Component Value Date   CHOL 125 01/23/2014   Lab Results  Component Value Date   HDL 37.10 (L) 01/23/2014   Lab Results  Component Value Date   LDLCALC 74 01/23/2014   Lab Results  Component Value Date   TRIG 64 07/13/2017   Lab Results  Component Value Date   CHOLHDL 3 01/23/2014   No results found for: HGBA1C    Assessment & Plan:   Problem List Items Addressed This Visit      Other   Chronic ulcer of left leg ([place]) - Primary    The left leg ulcer is smaller and no redness or swelling.  It is healing well, continue antibiotic and redress wound.  Follow up 2 weeks       Other Visit Diagnoses    Cellulitis and abscess of left leg       Relevant Medications   sulfamethoxazole-trimethoprim (BACTRIM) 400-80 MG tablet      Meds ordered this encounter  Medications  . sulfamethoxazole-trimethoprim (BACTRIM) 400-80 MG tablet    Sig: Take 1 tablet by mouth 2 (two) times daily.    Dispense:  14 tablet    Refill:  1    Follow-up: Return in about 2 weeks (around 01/11/2020).    [name], MD

## 2019-12-28 NOTE — Assessment & Plan Note (Signed)
The left leg ulcer is smaller and no redness or swelling.  It is healing well, continue antibiotic and redress wound.  Follow up 2 weeks

## 2020-01-04 DIAGNOSIS — H25041 Posterior subcapsular polar age-related cataract, right eye: Secondary | ICD-10-CM | POA: Diagnosis not present

## 2020-01-04 DIAGNOSIS — I10 Essential (primary) hypertension: Secondary | ICD-10-CM | POA: Diagnosis not present

## 2020-01-11 ENCOUNTER — Encounter: Payer: Self-pay | Admitting: Legal Medicine

## 2020-01-11 ENCOUNTER — Other Ambulatory Visit: Payer: Self-pay

## 2020-01-11 ENCOUNTER — Encounter: Payer: Self-pay | Admitting: Ophthalmology

## 2020-01-11 ENCOUNTER — Ambulatory Visit (INDEPENDENT_AMBULATORY_CARE_PROVIDER_SITE_OTHER): Payer: PPO | Admitting: Legal Medicine

## 2020-01-11 VITALS — BP 120/80 | HR 68 | Temp 98.0°F | Resp 16 | Ht 69.0 in | Wt 235.6 lb

## 2020-01-11 DIAGNOSIS — L97921 Non-pressure chronic ulcer of unspecified part of left lower leg limited to breakdown of skin: Secondary | ICD-10-CM

## 2020-01-11 NOTE — Progress Notes (Signed)
Established Patient Office Visit  Subjective:  Patient ID: Ryan Mccall, male    DOB: Jan 24, 1946  Age: 73 y.o. MRN: [id number]  CC:  Chief Complaint  Patient presents with  . Cellulitis    Follow up on ulcer on left leg    HPI Ryan Mccall presents for follow up of cellulitis and ulcer left leg.  It is healing and less red.    01/11/20 The leg wound has healed well on left leg.  Small scab.  No erythema or evidence for cellulitis.   Past Medical History:  Diagnosis Date  . Atrial fibrillation ([place])   . Coronary artery disease    s/p remote CABG 1999 per Dr. Cyndia Bent;  Lexiscan Myoview (01/2014):  No ischemia, not gated, normal study  . Hypercholesterolemia   . Hypertension   . 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 COLECTOMY WITH COLOSTOMY CREATION/HARTMANN PROCEDURE N/A 07/03/2017   Procedure: COLECTOMY WITH COLOSTOMY CREATION/HARTMANN PROCEDURE;  Surgeon: Florene Glen, MD;  Location: [facility] ORS;  Service: General;  Laterality: N/A;  . COLONOSCOPY WITH PROPOFOL N/A 10/27/2017   Procedure: COLONOSCOPY WITH PROPOFOL through colostomy;  Surgeon: Lucilla Lame, MD;  Location: [facility];  Service: Endoscopy;  Laterality: N/A;  . COLOSTOMY CLOSURE N/A 11/17/2017   Procedure: COLOSTOMY CLOSURE;  Surgeon: Florene Glen, MD;  Location: [facility] OR[facility];  Service: General;  Laterality: N/A;  . CORONARY ARTERY BYPASS GRAFT  1999  . excision of skin cancer    . KNEE ARTHROSCOPY W/ MENISCAL REPAIR      Family History  Problem Relation Age of Onset  . Lung disease Father 7    Social History   Socioeconomic History  . Marital status: Married    Spouse name: Not on file  . Number of children: 2  . Years of education: Not on file  . Highest education level: Not on file  Occupational History  . Occupation: Print production planner  Tobacco Use  . Smoking status: Former Smoker    Types: Cigars    Quit  date: 09/29/1997    Years since quitting: 22.2  . Smokeless tobacco: Former Systems developer    Types: Chew    Quit date: 1999  Substance and Sexual Activity  . Alcohol use: Yes    Alcohol/week: 1.0 standard drinks    Types: 1 Shots of liquor per week    Comment: one time monthly  . Drug use: No  . Sexual activity: Yes    Partners: Female  Other Topics Concern  . Not on file  Social History Narrative  . Not on file   Social Determinants of Health   Financial Resource Strain:   . Difficulty of Paying Living Expenses:   Food Insecurity:   . Worried About Charity fundraiser in the Last Year:   . Arboriculturist in the Last Year:   Transportation Needs:   . Film/video editor (Medical):   Marland Kitchen Lack of Transportation (Non-Medical):   Physical Activity:   . Days of Exercise per Week:   . Minutes of Exercise per Session:   Stress:   . Feeling of Stress :   Social Connections:   . Frequency of Communication with Friends and Family:   . Frequency of Social Gatherings with Friends and Family:   . Attends Religious Services:   . Active Member of Clubs or Organizations:   .  Attends Archivist Meetings:   Marland Kitchen Marital Status:   Intimate Partner Violence:   . Fear of Current or Ex-Partner:   . Emotionally Abused:   Marland Kitchen Physically Abused:   . Sexually Abused:     Outpatient Medications Prior to Visit  Medication Sig Dispense Refill  . carvedilol (COREG) 6.25 MG tablet Take 1 tablet (6.25 mg total) by mouth 2 (two) times daily. 180 tablet 3  . clotrimazole-betamethasone (LOTRISONE) cream     . COD LIVER OIL PO Take 1 capsule by mouth 3 (three) times a week.     . diclofenac sodium (VOLTAREN) 1 % GEL Apply 1 application topically daily as needed (joint pain).    Marland Kitchen ezetimibe (ZETIA) 10 MG tablet Take 1 tablet (10 mg total) by mouth at bedtime. 90 tablet 1  . feeding supplement, ENSURE ENLIVE, (ENSURE ENLIVE) LIQD Take 237 mLs by mouth 2 (two) times daily between meals. (Patient taking  differently: Take 237 mLs by mouth daily. ) 237 mL 12  . fexofenadine (ALLEGRA) 180 MG tablet Take 180 mg by mouth daily as needed for allergies.     . fish oil-omega-3 fatty acids 1000 MG capsule Take 1 g by mouth 3 (three) times a week.     . nitroGLYCERIN (NITROSTAT) 0.4 MG SL tablet Place 0.4 mg under the tongue as needed for chest pain. May repeat in 15 minutes up to 3 times for chest pain.  2  . rivaroxaban (XARELTO) 20 MG TABS tablet Take 1 tablet (20 mg total) by mouth daily with supper. 90 tablet 3  . simvastatin (ZOCOR) 10 MG tablet Take 1 tablet (10 mg total) by mouth at bedtime. 90 tablet 1  . spironolactone (ALDACTONE) 25 MG tablet Take 0.5 tablets (12.5 mg total) by mouth daily. 45 tablet 3  . UNABLE TO FIND 1 tablet as needed. Med Name: cardio-plus    . sulfamethoxazole-trimethoprim (BACTRIM) 400-80 MG tablet Take 1 tablet by mouth 2 (two) times daily. 14 tablet 1   No facility-administered medications prior to visit.    Allergies  Allergen Reactions  . Shellfish Allergy Nausea And Vomiting    ROS Review of Systems  Constitutional: Negative.   HENT: Negative.   Eyes: Negative.   Respiratory: Negative.   Cardiovascular: Negative.   Genitourinary: Negative.   Musculoskeletal: Negative.   Neurological: Negative.       Objective:    Physical Exam  Constitutional: He appears well-developed and well-nourished.  Cardiovascular: Normal rate, regular rhythm and normal heart sounds.  Pulmonary/Chest: Effort normal and breath sounds normal.  Skin:     Ulcer on left leg is healed well  Vitals reviewed.   BP 120/80 (BP Location: Right Arm, Patient Position: Sitting)   Pulse 68   Temp 98 F (36.7 C) (Temporal)   Resp 16   Ht 5\' 9"  (1.753 m)   Wt 235 lb 9.6 oz (106.9 kg)   BMI 34.79 kg/m  Wt Readings from Last 3 Encounters:  01/11/20 235 lb 9.6 oz (106.9 kg)  12/28/19 235 lb 12.8 oz (107 kg)  12/21/19 238 lb (108 kg)     Health Maintenance Due  Topic Date  Due  . Hepatitis C Screening  Never done  . TETANUS/TDAP  Never done  . PNA vac Low Risk Adult (1 of 2 - PCV13) Never done    There are no preventive care reminders to display for this patient.  Lab Results  Component Value Date   TSH 3.48 04/08/2013  Lab Results  Component Value Date   WBC 7.1 11/21/2017   HGB 12.1 (L) 11/21/2017   HCT 35.5 (L) 11/21/2017   MCV 84.8 11/21/2017   PLT 174 11/21/2017   Lab Results  Component Value Date   NA 132 (L) 11/23/2017   K 3.5 11/25/2017   CO2 26 11/23/2017   GLUCOSE 112 (H) 11/23/2017   BUN 12 11/23/2017   CREATININE 0.73 11/23/2017   BILITOT 1.2 11/18/2017   ALKPHOS 37 (L) 11/18/2017   AST 39 11/18/2017   ALT 20 11/18/2017   PROT 6.3 (L) 11/18/2017   ALBUMIN 3.3 (L) 11/18/2017   CALCIUM 7.9 (L) 11/23/2017   ANIONGAP 6 11/23/2017   GFR 98.27 01/23/2014   Lab Results  Component Value Date   CHOL 125 01/23/2014   Lab Results  Component Value Date   HDL 37.10 (L) 01/23/2014   Lab Results  Component Value Date   LDLCALC 74 01/23/2014   Lab Results  Component Value Date   TRIG 64 07/13/2017   Lab Results  Component Value Date   CHOLHDL 3 01/23/2014   No results found for: HGBA1C    Assessment & Plan:   Problem List Items Addressed This Visit      Musculoskeletal and Integument   Chronic ulcer of left leg, limited to breakdown of skin ([place]) - Primary    The wound is totally healed and does not require re-dressing, release         No orders of the defined types were placed in this encounter.   Follow-up: Return in about 5 months (around 06/12/2020) for fasting.    [name], MD

## 2020-01-11 NOTE — Assessment & Plan Note (Signed)
The wound is totally healed and does not require re-dressing, release

## 2020-01-17 ENCOUNTER — Other Ambulatory Visit
Admission: RE | Admit: 2020-01-17 | Discharge: 2020-01-17 | Disposition: A | Discharge status: Home / Self Care | Payer: PPO | Source: Ambulatory Visit | Attending: Ophthalmology | Admitting: Ophthalmology

## 2020-01-17 ENCOUNTER — Other Ambulatory Visit: Payer: Self-pay

## 2020-01-17 DIAGNOSIS — Z01812 Encounter for preprocedural laboratory examination: Secondary | ICD-10-CM | POA: Insufficient documentation

## 2020-01-17 DIAGNOSIS — Z20822 Contact with and (suspected) exposure to covid-19: Secondary | ICD-10-CM | POA: Insufficient documentation

## 2020-01-17 NOTE — Discharge Instructions (Signed)

## 2020-01-18 LAB — SARS CORONAVIRUS 2 (TAT 6-24 HRS): SARS Coronavirus 2: NEGATIVE

## 2020-01-19 ENCOUNTER — Encounter
Admission: RE | Disposition: A | Discharge status: Home / Self Care | Payer: Self-pay | Source: Home / Self Care | Attending: Ophthalmology

## 2020-01-19 ENCOUNTER — Ambulatory Visit
Admission: RE | Admit: 2020-01-19 | Discharge: 2020-01-19 | Disposition: A | Discharge status: Home / Self Care | Payer: PPO | Attending: Ophthalmology | Admitting: Ophthalmology

## 2020-01-19 ENCOUNTER — Other Ambulatory Visit: Payer: Self-pay

## 2020-01-19 ENCOUNTER — Encounter: Payer: Self-pay | Admitting: Ophthalmology

## 2020-01-19 ENCOUNTER — Ambulatory Visit: Payer: PPO | Admitting: Anesthesiology

## 2020-01-19 DIAGNOSIS — Z7901 Long term (current) use of anticoagulants: Secondary | ICD-10-CM | POA: Diagnosis not present

## 2020-01-19 DIAGNOSIS — Z6833 Body mass index (BMI) 33.0-33.9, adult: Secondary | ICD-10-CM | POA: Insufficient documentation

## 2020-01-19 DIAGNOSIS — I10 Essential (primary) hypertension: Secondary | ICD-10-CM | POA: Insufficient documentation

## 2020-01-19 DIAGNOSIS — E669 Obesity, unspecified: Secondary | ICD-10-CM | POA: Insufficient documentation

## 2020-01-19 DIAGNOSIS — H25041 Posterior subcapsular polar age-related cataract, right eye: Secondary | ICD-10-CM | POA: Diagnosis not present

## 2020-01-19 DIAGNOSIS — Z79899 Other long term (current) drug therapy: Secondary | ICD-10-CM | POA: Diagnosis not present

## 2020-01-19 DIAGNOSIS — I2510 Atherosclerotic heart disease of native coronary artery without angina pectoris: Secondary | ICD-10-CM | POA: Diagnosis not present

## 2020-01-19 DIAGNOSIS — Z951 Presence of aortocoronary bypass graft: Secondary | ICD-10-CM | POA: Diagnosis not present

## 2020-01-19 DIAGNOSIS — H25811 Combined forms of age-related cataract, right eye: Secondary | ICD-10-CM | POA: Diagnosis not present

## 2020-01-19 DIAGNOSIS — H2511 Age-related nuclear cataract, right eye: Secondary | ICD-10-CM | POA: Diagnosis not present

## 2020-01-19 DIAGNOSIS — Z87891 Personal history of nicotine dependence: Secondary | ICD-10-CM | POA: Insufficient documentation

## 2020-01-19 HISTORY — DX: Presence of dental prosthetic device (complete) (partial): Z97.2

## 2020-01-19 HISTORY — PX: CATARACT EXTRACTION W/PHACO: SHX586

## 2020-01-19 SURGERY — PHACOEMULSIFICATION, CATARACT, WITH IOL INSERTION
Anesthesia: Monitor Anesthesia Care | Laterality: Right

## 2020-01-19 MED ORDER — MIDAZOLAM HCL 2 MG/2ML IJ SOLN
INTRAMUSCULAR | Status: DC | PRN
  Administered 2020-01-19: 2 mg via INTRAVENOUS

## 2020-01-19 MED ORDER — FENTANYL CITRATE (PF) 100 MCG/2ML IJ SOLN
INTRAMUSCULAR | Status: DC | PRN
  Administered 2020-01-19: 50 ug via INTRAVENOUS

## 2020-01-19 MED ORDER — ACETAMINOPHEN 160 MG/5ML PO SOLN: 325.0000 mg | ORAL | Status: DC | PRN

## 2020-01-19 MED ORDER — BRIMONIDINE TARTRATE-TIMOLOL 0.2-0.5 % OP SOLN
OPHTHALMIC | Status: DC | PRN
  Administered 2020-01-19: 1 [drp] via OPHTHALMIC

## 2020-01-19 MED ORDER — ACETAMINOPHEN 325 MG PO TABS: 325.0000 mg | ORAL_TABLET | ORAL | Status: DC | PRN

## 2020-01-19 MED ORDER — LIDOCAINE HCL (PF) 2 % IJ SOLN
INTRAOCULAR | Status: DC | PRN
  Administered 2020-01-19: 2 mL via INTRAOCULAR

## 2020-01-19 MED ORDER — MOXIFLOXACIN HCL 0.5 % OP SOLN
OPHTHALMIC | Status: DC | PRN
  Administered 2020-01-19: 0.2 mL via OPHTHALMIC

## 2020-01-19 MED ORDER — ONDANSETRON HCL 4 MG/2ML IJ SOLN: 4.0000 mg | Freq: Once | INTRAMUSCULAR | Status: DC | PRN

## 2020-01-19 MED ORDER — ARMC OPHTHALMIC DILATING DROPS
1.0000 "application " | OPHTHALMIC | Status: DC | PRN
  Administered 2020-01-19 (×3): 1 via OPHTHALMIC

## 2020-01-19 MED ORDER — PROVISC 10 MG/ML IO SOLN
INTRAOCULAR | Status: DC | PRN
  Administered 2020-01-19: 0.55 mL via INTRAOCULAR

## 2020-01-19 MED ORDER — TETRACAINE HCL 0.5 % OP SOLN
1.0000 [drp] | OPHTHALMIC | Status: DC | PRN
  Administered 2020-01-19 (×3): 1 [drp] via OPHTHALMIC

## 2020-01-19 MED ORDER — TETRACAINE 0.5 % OP SOLN OPTIME - NO CHARGE
OPHTHALMIC | Status: DC | PRN
  Administered 2020-01-19: 2 [drp] via OPHTHALMIC

## 2020-01-19 MED ORDER — EPINEPHRINE PF 1 MG/ML IJ SOLN
INTRAOCULAR | Status: DC | PRN
  Administered 2020-01-19: 95 mL via OPHTHALMIC

## 2020-01-19 MED ORDER — TRYPAN BLUE 0.06 % OP SOLN
OPHTHALMIC | Status: DC | PRN
  Administered 2020-01-19: 0.5 mL via INTRAOCULAR

## 2020-01-19 MED ORDER — NA CHONDROIT SULF-NA HYALURON 40-17 MG/ML IO SOLN
INTRAOCULAR | Status: DC | PRN
  Administered 2020-01-19: 1 mL via INTRAOCULAR

## 2020-01-19 SURGICAL SUPPLY — 17 items
DISSECTOR HYDRO NUCLEUS 50X22 (MISCELLANEOUS) ×12 IMPLANT
DRSG TEGADERM 2-3/8X2-3/4 SM (GAUZE/BANDAGES/DRESSINGS) ×3 IMPLANT
GLOVE BIOGEL PI IND STRL 8 (GLOVE) ×1 IMPLANT
GLOVE BIOGEL PI INDICATOR 8 (GLOVE) ×2
GOWN STRL REUS W/ TWL LRG LVL3 (GOWN DISPOSABLE) ×1 IMPLANT
GOWN STRL REUS W/ TWL XL LVL3 (GOWN DISPOSABLE) ×1 IMPLANT
GOWN STRL REUS W/TWL LRG LVL3 (GOWN DISPOSABLE) ×3
GOWN STRL REUS W/TWL XL LVL3 (GOWN DISPOSABLE) ×3
KNIFE 45D UP 2.3 (MISCELLANEOUS) ×3 IMPLANT
LENS IOL DIOP 20.0 (Intraocular Lens) ×3 IMPLANT
LENS IOL TECNIS MONO 20.0 (Intraocular Lens) IMPLANT
PACK CATARACT (MISCELLANEOUS) ×3 IMPLANT
PACK DR. KING ARMS (PACKS) ×3 IMPLANT
PACK EYE AFTER SURG (MISCELLANEOUS) ×3 IMPLANT
SOLUTION OPHTHALMIC SALT (MISCELLANEOUS) ×3 IMPLANT
WATER STERILE IRR 250ML POUR (IV SOLUTION) ×3 IMPLANT
WIPE NON LINTING 3.25X3.25 (MISCELLANEOUS) ×3 IMPLANT

## 2020-01-19 NOTE — Anesthesia Preprocedure Evaluation (Addendum)
Anesthesia Evaluation  Patient identified by MRN, date of birth, ID band Patient awake    Reviewed: Allergy & Precautions, NPO status , Patient's Chart, lab work & pertinent test results, reviewed documented beta blocker date and time   Airway Mallampati: II  TM Distance: >3 FB Neck ROM: Full    Dental  (+) Upper Dentures, Lower Dentures   Pulmonary former smoker,    Pulmonary exam normal        Cardiovascular hypertension, Pt. on home beta blockers + CAD and + CABG (1999)  Normal cardiovascular exam+ dysrhythmias (taking xarelto, last dose 01/18/2020) Atrial Fibrillation   Stress Test 2017  Nuclear stress EF: 54%. The LV systolic function is normal  There was no ST segment deviation noted during stress.  The study is normal. no ischemia. No infarction   Neuro/Psych    GI/Hepatic negative GI ROS, Neg liver ROS,   Endo/Other  negative endocrine ROS  Renal/GU negative Renal ROS  negative genitourinary   Musculoskeletal negative musculoskeletal ROS (+)   Abdominal (+) + obese,   Peds  Hematology negative hematology ROS (+)   Anesthesia Other Findings   Reproductive/Obstetrics negative OB ROS                            Anesthesia Physical Anesthesia Plan  ASA: III  Anesthesia Plan: MAC   Post-op Pain Management:    Induction: Intravenous  PONV Risk Score and Plan: 1 and TIVA, Midazolam and Treatment may vary due to age or medical condition  Airway Management Planned: Natural Airway  Additional Equipment:   Intra-op Plan:   Post-operative Plan:   Informed Consent: I have reviewed the patients History and Physical, chart, labs and discussed the procedure including the risks, benefits and alternatives for the proposed anesthesia with the patient or authorized representative who has indicated his/her understanding and acceptance.     Dental advisory given  Plan Discussed with:  CRNA  Anesthesia Plan Comments:         Anesthesia Quick Evaluation

## 2020-01-19 NOTE — Op Note (Deleted)
   I have reviewed the patient's H&P and agree with its findings. There have been no interval changes.  Mallory Enriques MD Ophthalmology 

## 2020-01-19 NOTE — Anesthesia Procedure Notes (Signed)
Procedure Name: MAC Date/Time: 01/19/2020 8:57 AM Performed by: Jeannene Patella, CRNA Pre-anesthesia Checklist: Patient identified, Emergency Drugs available, Suction available, Patient being monitored and Timeout performed Patient Re-evaluated:Patient Re-evaluated prior to induction Oxygen Delivery Method: Nasal cannula

## 2020-01-19 NOTE — Anesthesia Postprocedure Evaluation (Signed)
Anesthesia Post Note  Patient: Ryan Mccall  Procedure(s) Performed: CATARACT EXTRACTION PHACO AND INTRAOCULAR LENS PLACEMENT (IOC) RIGHT VISION BLUE 8.60  00:58.6 (Right )     Anesthesia Post Evaluation  Konner Warrior

## 2020-01-19 NOTE — Op Note (Signed)
PREOPERATIVE DIAGNOSIS:  Nuclear sclerotic cataract of the RIGHT eye.   POSTOPERATIVE DIAGNOSIS:  Nuclear sclerotic cataract of the RIGHT eye.   OPERATIVE PROCEDURE: Cataract surgery OD   SURGEON:  Marchia Meiers, MD.   ANESTHESIA:  Anesthesiologist: Ardeth Sportsman, MD CRNA: Mayme Genta, CRNA; Jeannene Patella, CRNA  1.      Managed anesthesia care. 2.     0.37ml of Shugarcaine was instilled following the paracentesis   COMPLICATIONS:  None.   TECHNIQUE:   Divide and conquer   DESCRIPTION OF PROCEDURE:  The patient was examined and consented in the preoperative holding area where the aforementioned topical anesthesia was applied to the RIGHT eye and then brought back to the Operating Room where the RIGHT eye was prepped and draped in the usual sterile ophthalmic fashion and a lid speculum was placed. A paracentesis was created with the side port blade, the anterior chamber was washed out with trypan blue to stain the anterior capsule, and the anterior chamber was filled with viscoelastic. A near clear corneal incision was performed with the steel keratome. A continuous curvilinear capsulorrhexis was performed with a cystotome followed by the capsulorrhexis forceps. Hydrodissection and hydrodelineation were carried out with BSS on a blunt cannula. The lens was removed in a divide and conquer  technique and the remaining cortical material was removed with the irrigation-aspiration handpiece. The capsular bag was inflated with viscoelastic and the lens was placed in the capsular bag without complication. The remaining viscoelastic was removed from the eye with the irrigation-aspiration handpiece. The wounds were hydrated. The anterior chamber was flushed and the eye was inflated to physiologic pressure. 0.60ml Vigamox was placed in the anterior chamber. The wounds were found to be water tight. The eye was dressed with Vigamox. The patient was given protective glasses to wear throughout the day and a  shield with which to sleep tonight. The patient was also given drops with which to begin a drop regimen today and will follow-up with me in one day. Implant Name Type Inv. Item Serial No. Manufacturer Lot No. LRB No. Used Action  LENS IOL DIOP 20.0 - [id number] Intraocular Lens LENS IOL DIOP 20.0 [id number]  Right 1 Implanted    Procedure(s): CATARACT EXTRACTION PHACO AND INTRAOCULAR LENS PLACEMENT (IOC) RIGHT VISION BLUE 8.60  00:58.6 (Right)  Electronically signed: Rayvon Brandvold 01/19/2020 9:30 AM

## 2020-01-19 NOTE — Transfer of Care (Addendum)
Immediate Anesthesia Transfer of Care Note  Patient: Ryan Mccall  Procedure(s) Performed: CATARACT EXTRACTION PHACO AND INTRAOCULAR LENS PLACEMENT (IOC) RIGHT VISION BLUE 8.60  00:58.6 (Right )  Patient Location: PACU  Anesthesia Type: MAC  Level of Consciousness: awake, alert  and patient cooperative  Airway and Oxygen Therapy: Patient Spontanous Breathing and Patient connected to supplemental oxygen  Post-op Assessment: Post-op Vital signs reviewed, Patient's Cardiovascular Status Stable, Respiratory Function Stable, Patent Airway and No signs of Nausea or vomiting  Post-op Vital Signs: Reviewed and stable  Complications: No apparent anesthesia complications

## 2020-01-19 NOTE — H&P (Signed)
   I have reviewed the patient's H&P and agree with its findings. There have been no interval changes.  Aws Shere MD Ophthalmology 

## 2020-01-25 ENCOUNTER — Other Ambulatory Visit: Payer: Self-pay | Admitting: Cardiology

## 2020-01-25 MED ORDER — RIVAROXABAN 20 MG PO TABS: 20.0000 mg | ORAL_TABLET | Freq: Every day | ORAL | 3 refills | Status: DC

## 2020-01-25 NOTE — Telephone Encounter (Signed)
°*  STAT* If patient is at the pharmacy, call can be transferred to refill team.   1. Which medications need to be refilled? (please list name of each medication and dose if known)  rivaroxaban (XARELTO) 20 MG TABS tablet  2. Which pharmacy/location (including street and city if local pharmacy) is medication to be sent to?  [facility] - [place], [place]  3. Do they need a 30 day or 90 day supply? [place]

## 2020-01-27 ENCOUNTER — Telehealth: Payer: Self-pay | Admitting: Cardiology

## 2020-01-27 MED ORDER — RIVAROXABAN 20 MG PO TABS: 20.0000 mg | ORAL_TABLET | Freq: Every day | ORAL | 3 refills | Status: DC

## 2020-01-27 NOTE — Telephone Encounter (Signed)
Pt c/o medication issue:  1. Name of Medication: rivaroxaban (XARELTO) 20 MG TABS tablet  2. How are you currently taking this medication (dosage and times per day)? As directed  3. Are you having a reaction (difficulty breathing--STAT)? No  4. What is your medication issue? Patient states Rx for medication has been sent to the incorrect pharmacy. Please send Rx to [facility] ([place], [place].

## 2020-01-27 NOTE — Telephone Encounter (Signed)
Prescription refill sent to [facility] per patient request.

## 2020-04-30 ENCOUNTER — Other Ambulatory Visit: Payer: Self-pay

## 2020-04-30 MED ORDER — SIMVASTATIN 10 MG PO TABS: 10.0000 mg | ORAL_TABLET | Freq: Every day | ORAL | 2 refills | Status: DC

## 2020-06-05 NOTE — Progress Notes (Signed)
[name] Date of Birth: 12-16-1945 Medical Record #[id number]  History of Present Illness: Mr. Ryan Mccall is seen back today for follow up of CAD. He has known CAD, HTN and HLD. He had remote CABG per Dr. Cyndia Bent in 1999 with LIMA to LAD, SVG to 1st DX and SVG to intermediate and OM of the LCX. He did have transient atrial fibrillation during a stress test in 2009. His last stress Myoview in July 2017showed normal perfusion. He was diagnosed with persistent atrial fibrillation in July 2014. Treated with rate control and anticoagulation.  Echocardiogram showed normal LV function with mild mitral insufficiency and moderate left atrial enlargement.  Previously Irbesartan was  discontinued due to low BP.    On follow up today he is doing well. He brings extensive BP and HR recordings which have been good with rare elevation.  He notes he only gets chest tightness when he is mowing in the hot weather. He denies any other chest pain, dyspnea, palpitations, no edema.   Current Outpatient Medications on File Prior to Visit  Medication Sig Dispense Refill  . carvedilol (COREG) 6.25 MG tablet Take 1 tablet (6.25 mg total) by mouth 2 (two) times daily. 180 tablet 3  . clotrimazole-betamethasone (LOTRISONE) cream     . COD LIVER OIL PO Take 1 capsule by mouth 3 (three) times a week.     . diclofenac sodium (VOLTAREN) 1 % GEL Apply 1 application topically daily as needed (joint pain).    Marland Kitchen ezetimibe (ZETIA) 10 MG tablet Take 1 tablet (10 mg total) by mouth at bedtime. 90 tablet 1  . feeding supplement, ENSURE ENLIVE, (ENSURE ENLIVE) LIQD Take 237 mLs by mouth 2 (two) times daily between meals. (Patient taking differently: Take 237 mLs by mouth daily. ) 237 mL 12  . fexofenadine (ALLEGRA) 180 MG tablet Take 180 mg by mouth daily as needed for allergies.     . fish oil-omega-3 fatty acids 1000 MG capsule Take 1 g by mouth 3 (three) times a week.     . nitroGLYCERIN (NITROSTAT) 0.4 MG SL tablet Place 0.4 mg  under the tongue as needed for chest pain. May repeat in 15 minutes up to 3 times for chest pain.  2  . rivaroxaban (XARELTO) 20 MG TABS tablet Take 1 tablet (20 mg total) by mouth daily with supper. 90 tablet 3  . simvastatin (ZOCOR) 10 MG tablet Take 1 tablet (10 mg total) by mouth at bedtime. 90 tablet 2  . spironolactone (ALDACTONE) 25 MG tablet Take 0.5 tablets (12.5 mg total) by mouth daily. 45 tablet 3  . UNABLE TO FIND 1 tablet as needed. Med Name: cardio-plus     No current facility-administered medications on file prior to visit.    Allergies  Allergen Reactions  . Shellfish Allergy Nausea And Vomiting    Past Medical History:  Diagnosis Date  . Atrial fibrillation ([place])   . Coronary artery disease    s/p remote CABG 1999 per Dr. Cyndia Bent;  Lexiscan Myoview (01/2014):  No ischemia, not gated, normal study  . COVID-19 03/2019  . Hypercholesterolemia   . Hypertension   . 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 Wears dentures    full upper and lower    Past Surgical History:  Procedure Laterality Date  . CATARACT EXTRACTION W/PHACO Right 01/19/2020   Procedure: CATARACT EXTRACTION PHACO AND INTRAOCULAR LENS PLACEMENT ([place]) RIGHT VISION BLUE 8.60  00:58.6;  Surgeon: Marchia Meiers,  MD;  Location: [place];  Service: Ophthalmology;  Laterality: Right;  . COLECTOMY WITH COLOSTOMY CREATION/HARTMANN PROCEDURE N/A 07/03/2017   Procedure: COLECTOMY WITH COLOSTOMY CREATION/HARTMANN PROCEDURE;  Surgeon: Florene Glen, MD;  Location: [facility];  Service: General;  Laterality: N/A;  . COLONOSCOPY WITH PROPOFOL N/A 10/27/2017   Procedure: COLONOSCOPY WITH PROPOFOL through colostomy;  Surgeon: Lucilla Lame, MD;  Location: [facility];  Service: Endoscopy;  Laterality: N/A;  . COLOSTOMY CLOSURE N/A 11/17/2017   Procedure: COLOSTOMY CLOSURE;  Surgeon: Florene Glen, MD;  Location: [facility];  Service: General;  Laterality: N/A;  . CORONARY ARTERY BYPASS GRAFT   1999   4 vessels  . excision of skin cancer    . KNEE ARTHROSCOPY W/ MENISCAL REPAIR      Social History   Tobacco Use  Smoking Status Former Smoker  . Types: Cigars  . Quit date: 09/29/1997  . Years since quitting: 22.7  Smokeless Tobacco Former Systems developer  . Types: Chew  . Quit date: 1999    Social History   Substance and Sexual Activity  Alcohol Use Yes  . Alcohol/week: 1.0 standard drink  . Types: 1 Shots of liquor per week   Comment: one time monthly    Family History  Problem Relation Age of Onset  . Lung disease Father 58    Review of Systems: The review of systems is per the HPI.  All other systems were reviewed and are negative.  Physical Exam: BP 124/78   Pulse (!) 54   Ht 5\' 9"  (1.753 m)   Wt 230 lb 3.2 oz (104.4 kg)   BMI 33.99 kg/m   GENERAL:  Well appearing WM in NAD HEENT:  PERRL, EOMI, sclera are clear. Oropharynx is clear. NECK:  No jugular venous distention, carotid upstroke brisk and symmetric, no bruits, no thyromegaly or adenopathy LUNGS:  Clear to auscultation bilaterally CHEST:  Unremarkable HEART:  IRRR,  PMI not displaced or sustained,S1 and S2 within normal limits, no S3, no S4: no clicks, no rubs, no murmurs ABD:  Soft, nontender. BS +, no masses or bruits. No hepatomegaly, no splenomegaly EXT:  2 + pulses throughout, no edema, no cyanosis no clubbing SKIN:  Warm and dry.  No rashes NEURO:  Alert and oriented x 3. Cranial nerves II through XII intact. PSYCH:  Cognitively intact   LABORATORY DATA:   Lab Results  Component Value Date   WBC 7.1 11/21/2017   HGB 12.1 (L) 11/21/2017   HCT 35.5 (L) 11/21/2017   PLT 174 11/21/2017   GLUCOSE 112 (H) 11/23/2017   CHOL 125 01/23/2014   TRIG 64 07/13/2017   HDL 37.10 (L) 01/23/2014   LDLCALC 74 01/23/2014   ALT 20 11/18/2017   AST 39 11/18/2017   NA 132 (L) 11/23/2017   K 3.5 11/25/2017   CL 100 (L) 11/23/2017   CREATININE 0.73 11/23/2017   BUN 12 11/23/2017   CO2 26 11/23/2017    TSH 3.48 04/08/2013   INR 1.99 07/01/2017   Labs from 02/18/17:  CBC with platelet count 130K. Marland Kitchen Cholesterol 127, triglycerides 79, LDL 73, HDL 38. A1c 5.6%. CMET normal. Dated 10/28/17: cholesterol 126, triglycerides 91, HDL 38, LDL 66. A1c 5.3%. CBC and CMET normal. Dated 06/02/18: cholesterol 122, triglycerides 64, HDL 45, LDL 64. A1c 5.3%. CBC and CMET normal Dated 10/04/19: A1c 5.4%. cholesterol 116, triglycerides 102, HDL 41, LDL 71. Sodium 132, otherwise CBC and CMET normal.  Ecg today shows Afib with rate 54. Otherwise  normal. I have personally reviewed and interpreted this study.   Myoview 04/11/16: Study Highlights    Nuclear stress EF: 54%. The LV systolic function is normal  There was no ST segment deviation noted during stress.  The study is normal. no ischemia. No infarction  This is a low risk study.       Assessment / Plan: 1. CAD - remote CABG in 1999 - normal Myoview in July 2017. Patient has class 1 symptoms- stable angina. Continue risk factor modification. Continue Coreg.    2. Atrial fibrillation - permanent. Rate control is excellent on Coreg. He is asymptomatic. Continue anticoagulation with Xarelto.  3. HTN -  Well  Controlled on Coreg and spironolactone.   4. HLD - on statin. Well controlled. Labs followed by Dr Henrene Pastor.   5. Obesity.encouraged weight loss    Plan I will followup again in 12 months.

## 2020-06-06 ENCOUNTER — Ambulatory Visit (INDEPENDENT_AMBULATORY_CARE_PROVIDER_SITE_OTHER): Payer: PPO | Admitting: Cardiology

## 2020-06-06 ENCOUNTER — Encounter: Payer: Self-pay | Admitting: Cardiology

## 2020-06-06 ENCOUNTER — Other Ambulatory Visit: Payer: Self-pay

## 2020-06-06 VITALS — BP 124/78 | HR 54 | Ht 69.0 in | Wt 230.2 lb

## 2020-06-06 DIAGNOSIS — E7800 Pure hypercholesterolemia, unspecified: Secondary | ICD-10-CM | POA: Diagnosis not present

## 2020-06-06 DIAGNOSIS — I10 Essential (primary) hypertension: Secondary | ICD-10-CM

## 2020-06-06 DIAGNOSIS — I482 Chronic atrial fibrillation, unspecified: Secondary | ICD-10-CM

## 2020-06-06 DIAGNOSIS — I25708 Atherosclerosis of coronary artery bypass graft(s), unspecified, with other forms of angina pectoris: Secondary | ICD-10-CM

## 2020-06-11 ENCOUNTER — Other Ambulatory Visit: Payer: Self-pay | Admitting: Legal Medicine

## 2020-06-12 ENCOUNTER — Other Ambulatory Visit: Payer: Self-pay

## 2020-06-12 ENCOUNTER — Ambulatory Visit (INDEPENDENT_AMBULATORY_CARE_PROVIDER_SITE_OTHER): Payer: PPO | Admitting: Legal Medicine

## 2020-06-12 ENCOUNTER — Encounter: Payer: Self-pay | Admitting: Legal Medicine

## 2020-06-12 VITALS — BP 134/84 | HR 60 | Temp 97.5°F | Resp 16 | Ht 69.0 in | Wt 227.6 lb

## 2020-06-12 DIAGNOSIS — Z6833 Body mass index (BMI) 33.0-33.9, adult: Secondary | ICD-10-CM

## 2020-06-12 DIAGNOSIS — I10 Essential (primary) hypertension: Secondary | ICD-10-CM

## 2020-06-12 DIAGNOSIS — I25708 Atherosclerosis of coronary artery bypass graft(s), unspecified, with other forms of angina pectoris: Secondary | ICD-10-CM | POA: Diagnosis not present

## 2020-06-12 DIAGNOSIS — E7800 Pure hypercholesterolemia, unspecified: Secondary | ICD-10-CM | POA: Diagnosis not present

## 2020-06-12 DIAGNOSIS — K5732 Diverticulitis of large intestine without perforation or abscess without bleeding: Secondary | ICD-10-CM | POA: Diagnosis not present

## 2020-06-12 DIAGNOSIS — I4891 Unspecified atrial fibrillation: Secondary | ICD-10-CM

## 2020-06-12 NOTE — Progress Notes (Signed)
Subjective:  Patient ID: Ryan Mccall, male    DOB: 1946-04-23  Age: 74 y.o. MRN: [id number]  Chief Complaint  Patient presents with  . Atrial Fibrillation  . Hyperlipidemia  . Hypertension    HPI: chronic visit  Patient has atrial fibrillation patient is on xarelto.  No bleeding.  Patient presents for follow up of hypertension.  Patient tolerating coreg well with side effects.  Patient was diagnosed with hypertension 2010 so has been treated for hypertension for 10 years.Patient is working on maintaining diet and exercise regimen and follows up as directed. Complication include atrial fibrillation  Patient presents with hyperlipidemia.  Compliance with treatment has been good; patient takes medicines as directed, maintains low cholesterol diet, follows up as directed, and maintains exercise regimen.  Patient is using simvastatin and zetia without problems..  CORONARY ARTERY DISEASE  Patient presents in follow up of CAD. Patient was diagnosed in 1999. The patient has no associated CHF. The patient is currently taking a beta blocker, statin, and aspirin. CAD was diagnosed 1999 years ago.  Patient is having occasion angina. Patient has used no NTG.  Patient is followed by cardiology.  Patient had CABG . Last angiography was 1999, last echocardiogram one year.   Current Outpatient Medications on File Prior to Visit  Medication Sig Dispense Refill  . carvedilol (COREG) 6.25 MG tablet Take 1 tablet (6.25 mg total) by mouth 2 (two) times daily. 180 tablet 3  . clotrimazole-betamethasone (LOTRISONE) cream     . COD LIVER OIL PO Take 1 capsule by mouth 3 (three) times a week.     . diclofenac sodium (VOLTAREN) 1 % GEL Apply 1 application topically daily as needed (joint pain).    Marland Kitchen ezetimibe (ZETIA) 10 MG tablet Take 1 tablet (10 mg total) by mouth at bedtime. 90 tablet 1  . feeding supplement, ENSURE ENLIVE, (ENSURE ENLIVE) LIQD Take 237 mLs by mouth 2 (two) times daily between meals.  (Patient taking differently: Take 237 mLs by mouth daily. ) 237 mL 12  . fexofenadine (ALLEGRA) 180 MG tablet Take 180 mg by mouth daily as needed for allergies.     . fish oil-omega-3 fatty acids 1000 MG capsule Take 1 g by mouth 3 (three) times a week.     . nitroGLYCERIN (NITROSTAT) 0.4 MG SL tablet Place 0.4 mg under the tongue as needed for chest pain. May repeat in 15 minutes up to 3 times for chest pain.  2  . rivaroxaban (XARELTO) 20 MG TABS tablet Take 1 tablet (20 mg total) by mouth daily with supper. 90 tablet 3  . simvastatin (ZOCOR) 10 MG tablet Take 1 tablet (10 mg total) by mouth at bedtime. 90 tablet 2  . spironolactone (ALDACTONE) 25 MG tablet Take 0.5 tablets (12.5 mg total) by mouth daily. 45 tablet 3  . UNABLE TO FIND 1 tablet as needed. Med Name: cardio-plus     No current facility-administered medications on file prior to visit.   Past Medical History:  Diagnosis Date  . Atrial fibrillation ([place])   . Coronary artery disease    s/p remote CABG 1999 per Dr. Cyndia Bent;  Lexiscan Myoview (01/2014):  No ischemia, not gated, normal study  . COVID-19 03/2019  . Hypercholesterolemia   . Hypertension   . 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 Wears dentures    full upper and lower   Past Surgical History:  Procedure Laterality Date  . CATARACT EXTRACTION W/PHACO  Right 01/19/2020   Procedure: CATARACT EXTRACTION PHACO AND INTRAOCULAR LENS PLACEMENT ([place]) RIGHT VISION BLUE 8.60  00:58.6;  Surgeon: Marchia Meiers, MD;  Location: [place];  Service: Ophthalmology;  Laterality: Right;  . COLECTOMY WITH COLOSTOMY CREATION/HARTMANN PROCEDURE N/A 07/03/2017   Procedure: COLECTOMY WITH COLOSTOMY CREATION/HARTMANN PROCEDURE;  Surgeon: Florene Glen, MD;  Location: [facility];  Service: General;  Laterality: N/A;  . COLONOSCOPY WITH PROPOFOL N/A 10/27/2017   Procedure: COLONOSCOPY WITH PROPOFOL through colostomy;  Surgeon: Lucilla Lame, MD;  Location: [facility];  Service: Endoscopy;  Laterality: N/A;  . COLOSTOMY CLOSURE N/A 11/17/2017   Procedure: COLOSTOMY CLOSURE;  Surgeon: Florene Glen, MD;  Location: [facility];  Service: General;  Laterality: N/A;  . CORONARY ARTERY BYPASS GRAFT  1999   4 vessels  . excision of skin cancer    . KNEE ARTHROSCOPY W/ MENISCAL REPAIR      Family History  Problem Relation Age of Onset  . Lung disease Father 55   Social History   Socioeconomic History  . Marital status: Married    Spouse name: Not on file  . Number of children: 2  . Years of education: Not on file  . Highest education level: Not on file  Occupational History  . Occupation: Print production planner  Tobacco Use  . Smoking status: Former Smoker    Types: Cigars    Quit date: 09/29/1997    Years since quitting: 22.7  . Smokeless tobacco: Former Systems developer    Types: [place] date: 1999  Media planner  . Vaping Use: Never used  Substance and Sexual Activity  . Alcohol use: Yes    Alcohol/week: 1.0 standard drink    Types: 1 Shots of liquor per week    Comment: one time monthly  . Drug use: No  . Sexual activity: Yes    Partners: Female  Other Topics Concern  . Not on file  Social History Narrative  . Not on file   Social Determinants of Health   Financial Resource Strain:   . Difficulty of Paying Living Expenses: Not on file  Food Insecurity:   . Worried About Charity fundraiser in the Last Year: Not on file  . Ran Out of Food in the Last Year: Not on file  Transportation Needs:   . Lack of Transportation (Medical): Not on file  . Lack of Transportation (Non-Medical): Not on file  Physical Activity:   . Days of Exercise per Week: Not on file  . Minutes of Exercise per Session: Not on file  Stress:   . Feeling of Stress : Not on file  Social Connections:   . Frequency of Communication with Friends and Family: Not on file  . Frequency of Social Gatherings with Friends and Family: Not on file  . Attends  Religious Services: Not on file  . Active Member of Clubs or Organizations: Not on file  . Attends Archivist Meetings: Not on file  . Marital Status: Not on file    Review of Systems  Constitutional: Negative.   HENT: Negative.   Eyes: Negative.   Respiratory: Negative for cough and choking.   Cardiovascular: Positive for chest pain. Negative for palpitations and leg swelling.  Gastrointestinal: Negative.   Endocrine: Negative.   Genitourinary: Negative.   Musculoskeletal: Negative.   Skin: Negative.   Neurological: Negative.   Psychiatric/Behavioral: Negative.      Objective:  BP 134/84   Pulse 60  Temp (!) 97.5 F (36.4 C)   Resp 16   Ht 5\' 9"  (1.753 m)   Wt 227 lb 9.6 oz (103.2 kg)   SpO2 97%   BMI 33.61 kg/m   BP/Weight 06/12/2020 06/06/2020 10/04/2692  Systolic BP 854 627 035  Diastolic BP 84 78 82  Wt. (Lbs) 227.6 230.2 229  BMI 33.61 33.99 33.82    Physical Exam Vitals reviewed.  Constitutional:      Appearance: Normal appearance.  HENT:     Head: Normocephalic and atraumatic.     Right Ear: Tympanic membrane, ear canal and external ear normal.     Left Ear: Tympanic membrane, ear canal and external ear normal.     Nose: Nose normal.     Mouth/Throat:     Mouth: Mucous membranes are moist.     Pharynx: Oropharynx is clear.  Eyes:     Extraocular Movements: Extraocular movements intact.     Conjunctiva/sclera: Conjunctivae normal.     Pupils: Pupils are equal, round, and reactive to Rijo.  Cardiovascular:     Rate and Rhythm: Normal rate and regular rhythm.     Pulses: Normal pulses.     Heart sounds: Normal heart sounds.  Pulmonary:     Effort: Pulmonary effort is normal.     Breath sounds: Normal breath sounds.  Abdominal:     General: Abdomen is flat. Bowel sounds are normal.     Palpations: Abdomen is soft.  Musculoskeletal:        General: Normal range of motion.     Cervical back: Normal range of motion and neck supple.   Skin:    General: Skin is warm and dry.     Capillary Refill: Capillary refill takes less than 2 seconds.  Neurological:     General: No focal deficit present.     Mental Status: He is alert and oriented to person, place, and time. Mental status is at baseline.  Psychiatric:        Mood and Affect: Mood normal.       Lab Results  Component Value Date   WBC 7.1 11/21/2017   HGB 12.1 (L) 11/21/2017   HCT 35.5 (L) 11/21/2017   PLT 174 11/21/2017   GLUCOSE 112 (H) 11/23/2017   CHOL 125 01/23/2014   TRIG 64 07/13/2017   HDL 37.10 (L) 01/23/2014   LDLCALC 74 01/23/2014   ALT 20 11/18/2017   AST 39 11/18/2017   NA 132 (L) 11/23/2017   K 3.5 11/25/2017   CL 100 (L) 11/23/2017   CREATININE 0.73 11/23/2017   BUN 12 11/23/2017   CO2 26 11/23/2017   TSH 3.48 04/08/2013   INR 1.99 07/01/2017      Assessment & Plan:   1. Essential hypertension - CBC with Differential/Platelet - Comprehensive metabolic panel An individual hypertension care plan was established and reinforced today.  The patient's status was assessed using clinical findings on exam and labs or diagnostic tests. The patient's success at meeting treatment goals on disease specific evidence-based guidelines and found to be well controlled. SELF MANAGEMENT: The patient and I together assessed ways to personally work towards obtaining the recommended goals. RECOMMENDATIONS: avoid decongestants found in common cold remedies, decrease consumption of alcohol, perform routine monitoring of BP with home BP cuff, exercise, reduction of dietary salt, take medicines as prescribed, try not to miss doses and quit smoking.  Regular exercise and maintaining a healthy weight is needed.  Stress reduction may help. A CLINICAL SUMMARY  including written plan identify barriers to care unique to individual due to social or financial issues.  We attempt to mutually creat solutions for individual and family understanding.  2.  Hypercholesterolemia - Lipid panel AN INDIVIDUAL CARE PLAN for hyperlipidemia/ cholesterol was established and reinforced today.  The patient's status was assessed using clinical findings on exam, lab and other diagnostic tests. The patient's disease status was assessed based on evidence-based guidelines and found to be well controlled. MEDICATIONS were reviewed. SELF MANAGEMENT GOALS have been discussed and patient's success at attaining the goal of low cholesterol was assessed. RECOMMENDATION given include regular exercise 3 days a week and low cholesterol/low fat diet. CLINICAL SUMMARY including written plan to identify barriers unique to the patient due to social or economic  reasons was discussed.  3. Diverticulitis of colon (without mention of hemorrhage)(562.11) The diverticulosis is stable, no pain or infection  4. Coronary artery disease involving coronary bypass graft of native heart with other forms of angina pectoris [facility]) Patient's CAD was assessed using history and physical along with other information to maximize treatment.  Evidence based criteria was use in deciding proper management for this disease process.  Patient's CAD is under good control.therapy continue present treatment.  5. Atrial fibrillation, unspecified type [facility]) Patient has a diagnosis of permanent atrial fibrillation.   Patient is on xarelto and has controlled ventricular response.  Patient is CV stable.  6. BMI 33.0-33.9,adult An individualize plan was formulated for obesity using patient history and physical exam to encourage weight loss.  An evidence based program was formulated.  Patient is to cut portion size with meals and to plan physical exercise 3 days a week at least 20 minutes.  Weight watchers and other programs are helpful.  Planned amount of weight loss 10 lbs.      Orders Placed This Encounter  Procedures  . CBC with Differential/Platelet  . Comprehensive metabolic panel  . Lipid panel      Follow-up: Return in about 6 months (around 12/10/2020) for fasting,medicare pe.  An After Visit Summary was printed and given to the patient.  [place] [phone]

## 2020-06-13 LAB — CBC WITH DIFFERENTIAL/PLATELET
Basophils Absolute: 0 10*3/uL (ref 0.0–0.2)
Basos: 1 %
EOS (ABSOLUTE): 0.1 10*3/uL (ref 0.0–0.4)
Eos: 3 %
Hematocrit: 44.5 % (ref 37.5–51.0)
Hemoglobin: 14.9 g/dL (ref 13.0–17.7)
Immature Grans (Abs): 0 10*3/uL (ref 0.0–0.1)
Immature Granulocytes: 0 %
Lymphocytes Absolute: 1.4 10*3/uL (ref 0.7–3.1)
Lymphs: 33 %
MCH: 29.7 pg (ref 26.6–33.0)
MCHC: 33.5 g/dL (ref 31.5–35.7)
MCV: 89 fL (ref 79–97)
Monocytes Absolute: 0.5 10*3/uL (ref 0.1–0.9)
Monocytes: 12 %
Neutrophils Absolute: 2.1 10*3/uL (ref 1.4–7.0)
Neutrophils: 51 %
Platelets: 162 10*3/uL (ref 150–450)
RBC: 5.02 x10E6/uL (ref 4.14–5.80)
RDW: 13.9 % (ref 11.6–15.4)
WBC: 4.2 10*3/uL (ref 3.4–10.8)

## 2020-06-13 LAB — COMPREHENSIVE METABOLIC PANEL
ALT: 34 IU/L (ref 0–44)
AST: 39 IU/L (ref 0–40)
Albumin/Globulin Ratio: 1.8 (ref 1.2–2.2)
Albumin: 4.6 g/dL (ref 3.7–4.7)
Alkaline Phosphatase: 63 IU/L (ref 44–121)
BUN/Creatinine Ratio: 23 (ref 10–24)
BUN: 19 mg/dL (ref 8–27)
Bilirubin Total: 0.6 mg/dL (ref 0.0–1.2)
CO2: 24 mmol/L (ref 20–29)
Calcium: 9.5 mg/dL (ref 8.6–10.2)
Chloride: 103 mmol/L (ref 96–106)
Creatinine, Ser: 0.84 mg/dL (ref 0.76–1.27)
GFR calc Af Amer: 100 mL/min/{1.73_m2} (ref >59)
GFR calc non Af Amer: 87 mL/min/{1.73_m2} (ref >59)
Globulin, Total: 2.6 g/dL (ref 1.5–4.5)
Glucose: 102 mg/dL — ABNORMAL HIGH (ref 65–99)
Potassium: 4.9 mmol/L (ref 3.5–5.2)
Sodium: 141 mmol/L (ref 134–144)
Total Protein: 7.2 g/dL (ref 6.0–8.5)

## 2020-06-13 LAB — LIPID PANEL
Chol/HDL Ratio: 2.8 ratio (ref 0.0–5.0)
Cholesterol, Total: 119 mg/dL (ref 100–199)
HDL: 43 mg/dL (ref >39)
LDL Chol Calc (NIH): 64 mg/dL (ref 0–99)
Triglycerides: 54 mg/dL (ref 0–149)
VLDL Cholesterol Cal: 12 mg/dL (ref 5–40)

## 2020-06-13 LAB — CARDIOVASCULAR RISK ASSESSMENT

## 2020-06-13 NOTE — Progress Notes (Signed)
Mild anemia, glucose 102, kidney tests normal, liver tests normal, Cholesterol normal,  lp

## 2020-07-03 ENCOUNTER — Other Ambulatory Visit: Payer: Self-pay

## 2020-07-03 ENCOUNTER — Encounter: Payer: Self-pay | Admitting: Legal Medicine

## 2020-07-03 ENCOUNTER — Ambulatory Visit (INDEPENDENT_AMBULATORY_CARE_PROVIDER_SITE_OTHER): Payer: PPO | Admitting: Legal Medicine

## 2020-07-03 VITALS — BP 110/70 | HR 74 | Temp 98.0°F | Resp 16 | Ht 69.0 in | Wt 230.0 lb

## 2020-07-03 DIAGNOSIS — L97921 Non-pressure chronic ulcer of unspecified part of left lower leg limited to breakdown of skin: Secondary | ICD-10-CM

## 2020-07-03 DIAGNOSIS — Z23 Encounter for immunization: Secondary | ICD-10-CM

## 2020-07-03 MED ORDER — CEPHALEXIN 500 MG PO CAPS: 500.0000 mg | ORAL_CAPSULE | Freq: Two times a day (BID) | ORAL | 0 refills | Status: DC

## 2020-07-03 MED ORDER — CHERATUSSIN AC 100-10 MG/5ML PO SOLN: 5.0000 mL | Freq: Three times a day (TID) | ORAL | 0 refills | Status: DC | PRN

## 2020-07-03 NOTE — Progress Notes (Signed)
Subjective:  Patient ID: Ryan Mccall, male    DOB: May 02, 1946  Age: 74 y.o. MRN: [id number]  Chief Complaint  Patient presents with   Skin Ulcer    Patient hurts his left leg 5 days ago    HPI: patient has laceration on dorsal aspect of left leg from falling branch he was cutting on Friday October 1.  The skin is denuded and there is some redenss surrounding wound- 2 by 4 cm. ? Last tetanus.  No fever or chills   Current Outpatient Medications on File Prior to Visit  Medication Sig Dispense Refill   carvedilol (COREG) 6.25 MG tablet Take 1 tablet (6.25 mg total) by mouth 2 (two) times daily. 180 tablet 3   clotrimazole-betamethasone (LOTRISONE) cream      COD LIVER OIL PO Take 1 capsule by mouth 3 (three) times a week.      diclofenac sodium (VOLTAREN) 1 % GEL Apply 1 application topically daily as needed (joint pain).     ezetimibe (ZETIA) 10 MG tablet Take 1 tablet (10 mg total) by mouth at bedtime. 90 tablet 1   feeding supplement, ENSURE ENLIVE, (ENSURE ENLIVE) LIQD Take 237 mLs by mouth 2 (two) times daily between meals. (Patient taking differently: Take 237 mLs by mouth daily. ) 237 mL 12   fexofenadine (ALLEGRA) 180 MG tablet Take 180 mg by mouth daily as needed for allergies.      fish oil-omega-3 fatty acids 1000 MG capsule Take 1 g by mouth 3 (three) times a week.      nitroGLYCERIN (NITROSTAT) 0.4 MG SL tablet Place 0.4 mg under the tongue as needed for chest pain. May repeat in 15 minutes up to 3 times for chest pain.  2   rivaroxaban (XARELTO) 20 MG TABS tablet Take 1 tablet (20 mg total) by mouth daily with supper. 90 tablet 3   simvastatin (ZOCOR) 10 MG tablet Take 1 tablet (10 mg total) by mouth at bedtime. 90 tablet 2   spironolactone (ALDACTONE) 25 MG tablet Take 0.5 tablets (12.5 mg total) by mouth daily. 45 tablet 3   UNABLE TO FIND 1 tablet as needed. Med Name: cardio-plus     No current facility-administered medications on file prior to visit.    Past Medical History:  Diagnosis Date   Atrial fibrillation [facility])    Coronary artery disease    s/p remote CABG 1999 per Dr. 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   Wears dentures    full upper and lower   Past Surgical History:  Procedure Laterality Date   CATARACT EXTRACTION W/PHACO Right 01/19/2020   Procedure: CATARACT EXTRACTION PHACO AND INTRAOCULAR LENS PLACEMENT ([place]) RIGHT VISION BLUE 8.60  00:58.6;  Surgeon: Marchia Meiers, MD;  Location: [place];  Service: Ophthalmology;  Laterality: Right;   COLECTOMY WITH COLOSTOMY CREATION/HARTMANN PROCEDURE N/A 07/03/2017   Procedure: COLECTOMY WITH COLOSTOMY CREATION/HARTMANN PROCEDURE;  Surgeon: Florene Glen, MD;  Location: [facility];  Service: General;  Laterality: N/A;   COLONOSCOPY WITH PROPOFOL N/A 10/27/2017   Procedure: COLONOSCOPY WITH PROPOFOL through colostomy;  Surgeon: Lucilla Lame, MD;  Location: [facility];  Service: Endoscopy;  Laterality: N/A;   COLOSTOMY CLOSURE N/A 11/17/2017   Procedure: COLOSTOMY CLOSURE;  Surgeon: Florene Glen, MD;  Location: [facility];  Service: General;  Laterality: N/A;   CORONARY ARTERY BYPASS GRAFT  1999   4 vessels   excision of skin cancer     KNEE ARTHROSCOPY W/ MENISCAL REPAIR      Family History  Problem Relation Age of Onset   Lung disease Father 45   Social History   Socioeconomic History   Marital status: Married    Spouse name: Not on file   Number of children: 2   Years of education: Not on file   Highest education level: Not on file  Occupational History   Occupation: Print production planner  Tobacco Use   Smoking status: Former Smoker    Types: Cigars    Quit date: 09/29/1997    Years since quitting: 22.7   Smokeless tobacco: Former Systems developer    Types: Chew    Quit date: 1999   Vaping Use   Vaping Use: Never used  Substance and Sexual Activity   Alcohol use: Yes    Alcohol/week: 1.0 standard drink    Types: 1 Shots of liquor per week    Comment: one time monthly   Drug use: No   Sexual activity: Yes    Partners: Female  Other Topics Concern   Not on file  Social History Narrative   Not on file   Social Determinants of Health   Financial Resource Strain:    Difficulty of Paying Living Expenses: Not on file  Food Insecurity:    Worried About Charity fundraiser in the Last Year: Not on file   [employer] of Food in the Last Year: Not on file  Transportation Needs:    Lack of Transportation (Medical): Not on file   Lack of Transportation (Non-Medical): Not on file  Physical Activity:    Days of Exercise per Week: Not on file   Minutes of Exercise per Session: Not on file  Stress:    Feeling of Stress : Not on file  Social Connections:    Frequency of Communication with Friends and Family: Not on file   Frequency of Social Gatherings with Friends and Family: Not on file   Attends Religious Services: Not on file   Active Member of Clubs or Organizations: Not on file   Attends Archivist Meetings: Not on file   Marital Status: Not on file    Review of Systems  Constitutional: Negative.   HENT: Negative.   Eyes: Negative.   Respiratory: Negative for cough and shortness of breath.   Cardiovascular: Negative for chest pain, palpitations and leg swelling.  Gastrointestinal: Negative.   Endocrine: Negative.   Genitourinary: Negative.   Musculoskeletal: Negative.   Neurological: Negative.      Objective:  BP 110/70    Pulse 74    Temp 98 F (36.7 C)    Resp 16    Ht 5\' 9"  (1.753 m)    Wt 230 lb (104.3 kg)    BMI 33.97 kg/m   BP/Weight 07/03/2020 2/72/5366 12/31/345  Systolic BP 425 956 387  Diastolic BP 70 84 78  Wt. (Lbs) 230 227.6 230.2  BMI 33.97 33.61 33.99    Physical Exam Constitutional:      Appearance:  He is normal weight.  HENT:     Head: Normocephalic.     Right Ear: Tympanic membrane normal.     Left Ear: Tympanic membrane normal.     Mouth/Throat:     Mouth: Mucous membranes are moist.     Pharynx: Oropharynx is clear.  Eyes:  Extraocular Movements: Extraocular movements intact.     Conjunctiva/sclera: Conjunctivae normal.     Pupils: Pupils are equal, round, and reactive to [place].  Cardiovascular:     Rate and Rhythm: Normal rate and regular rhythm.     Pulses: Normal pulses.     Heart sounds: Normal heart sounds.  Pulmonary:     Effort: Pulmonary effort is normal.     Breath sounds: Normal breath sounds.  Abdominal:     General: Abdomen is flat. Bowel sounds are normal.     Palpations: Abdomen is soft.  Musculoskeletal:        General: Normal range of motion.  Skin:    General: Skin is warm.     Capillary Refill: Capillary refill takes less than 2 seconds.     Comments: 2 by 4 cm area of denuded area.  On back of left led with surrounding erythema  Neurological:     Mental Status: Mental status is at baseline.  Psychiatric:        Mood and Affect: Mood normal.       Lab Results  Component Value Date   WBC 4.2 06/12/2020   HGB 14.9 06/12/2020   HCT 44.5 06/12/2020   PLT 162 06/12/2020   GLUCOSE 102 (H) 06/12/2020   CHOL 119 06/12/2020   TRIG 54 06/12/2020   HDL 43 06/12/2020   LDLCALC 64 06/12/2020   ALT 34 06/12/2020   AST 39 06/12/2020   NA 141 06/12/2020   K 4.9 06/12/2020   CL 103 06/12/2020   CREATININE 0.84 06/12/2020   BUN 19 06/12/2020   CO2 24 06/12/2020   TSH 3.48 04/08/2013   INR 1.99 07/01/2017      Assessment & Plan:   1. Chronic ulcer of left leg, limited to breakdown of skin (HCC) - cephALEXin (KEFLEX) 500 MG capsule; Take 1 capsule (500 mg total) by mouth 2 (two) times daily.  Dispense: 20 capsule; Refill: 0 - Tdap vaccine greater than or equal to 7yo IM Patient has ulcer on left leg from trauma with some secondary  infection.  The area dressed. 2. Need for vaccination - Tdap vaccine greater than or equal to 7yo IM - Flu Vaccine QUAD High Dose(Fluad)         Follow-up: Return in about 1 week (around 07/10/2020) for wound.  An After Visit Summary was printed and given to the patient.  [place] [phone]

## 2020-07-04 ENCOUNTER — Other Ambulatory Visit: Payer: Self-pay | Admitting: Cardiology

## 2020-07-04 ENCOUNTER — Other Ambulatory Visit: Payer: Self-pay

## 2020-07-04 MED ORDER — CARVEDILOL 6.25 MG PO TABS
6.2500 mg | ORAL_TABLET | Freq: Two times a day (BID) | ORAL | 3 refills | Status: DC
Start: 2020-07-04 — End: 2021-05-28

## 2020-07-04 MED ORDER — SPIRONOLACTONE 25 MG PO TABS: 12.5000 mg | ORAL_TABLET | Freq: Every day | ORAL | 3 refills | Status: DC

## 2020-07-04 MED ORDER — NITROGLYCERIN 0.4 MG SL SUBL: 0.4000 mg | SUBLINGUAL_TABLET | SUBLINGUAL | 2 refills | Status: DC | PRN

## 2020-07-04 NOTE — Telephone Encounter (Signed)
*  STAT* If patient is at the pharmacy, call can be transferred to refill team.   1. Which medications need to be refilled? (please list name of each medication and dose if known)  carvedilol (COREG) 6.25 MG tablet spironolactone (ALDACTONE) 25 MG tablet  2. Which pharmacy/location (including street and city if local pharmacy) is medication to be sent to? [facility] - [place], [place]  3. Do they need a 30 day or 90 day supply? 90 day

## 2020-07-10 ENCOUNTER — Encounter: Payer: Self-pay | Admitting: Legal Medicine

## 2020-07-10 ENCOUNTER — Other Ambulatory Visit: Payer: Self-pay

## 2020-07-10 ENCOUNTER — Ambulatory Visit (INDEPENDENT_AMBULATORY_CARE_PROVIDER_SITE_OTHER): Payer: PPO | Admitting: Legal Medicine

## 2020-07-10 VITALS — BP 120/78 | HR 70 | Temp 97.5°F | Ht 69.0 in | Wt 228.4 lb

## 2020-07-10 DIAGNOSIS — L97921 Non-pressure chronic ulcer of unspecified part of left lower leg limited to breakdown of skin: Secondary | ICD-10-CM | POA: Diagnosis not present

## 2020-07-10 MED ORDER — SILVER SULFADIAZINE 1 % EX CREA: 1.0000 "application " | TOPICAL_CREAM | Freq: Every day | CUTANEOUS | 2 refills | Status: AC

## 2020-07-10 NOTE — Progress Notes (Signed)
Subjective:  Patient ID: Ryan Mccall, male    DOB: 1946-06-20  Age: 74 y.o. MRN: [id number]  Chief Complaint  Patient presents with  . Skin Ulcer    Patient hurts his left leg 5 days ago    HPI: patient has laceration on dorsal aspect of left leg from falling branch he was cutting on Friday October 1.  The skin is denuded and there is some redenss surrounding wound- 2 by 4 cm. ? Last tetanus.  No fever or chills    Follow up, 07/10/2020, the wound is totally healed, no scarring. Current Outpatient Medications on File Prior to Visit  Medication Sig Dispense Refill  . carvedilol (COREG) 6.25 MG tablet Take 1 tablet (6.25 mg total) by mouth 2 (two) times daily. 180 tablet 3  . clotrimazole-betamethasone (LOTRISONE) cream     . COD LIVER OIL PO Take 1 capsule by mouth 3 (three) times a week.     . diclofenac sodium (VOLTAREN) 1 % GEL Apply 1 application topically daily as needed (joint pain).    Marland Kitchen ezetimibe (ZETIA) 10 MG tablet Take 1 tablet (10 mg total) by mouth at bedtime. 90 tablet 1  . feeding supplement, ENSURE ENLIVE, (ENSURE ENLIVE) LIQD Take 237 mLs by mouth 2 (two) times daily between meals. (Patient taking differently: Take 237 mLs by mouth daily. ) 237 mL 12  . fexofenadine (ALLEGRA) 180 MG tablet Take 180 mg by mouth daily as needed for allergies.     . fish oil-omega-3 fatty acids 1000 MG capsule Take 1 g by mouth 3 (three) times a week.     . nitroGLYCERIN (NITROSTAT) 0.4 MG SL tablet Place 0.4 mg under the tongue as needed for chest pain. May repeat in 15 minutes up to 3 times for chest pain.  2  . rivaroxaban (XARELTO) 20 MG TABS tablet Take 1 tablet (20 mg total) by mouth daily with supper. 90 tablet 3  . simvastatin (ZOCOR) 10 MG tablet Take 1 tablet (10 mg total) by mouth at bedtime. 90 tablet 2  . spironolactone (ALDACTONE) 25 MG tablet Take 0.5 tablets (12.5 mg total) by mouth daily. 45 tablet 3  . UNABLE TO FIND 1 tablet as needed. Med Name: cardio-plus     No  current facility-administered medications on file prior to visit.   Past Medical History:  Diagnosis Date  . Atrial fibrillation ([place])   . Coronary artery disease    s/p remote CABG 1999 per Dr. Cyndia Bent;  Lexiscan Myoview (01/2014):  No ischemia, not gated, normal study  . COVID-19 03/2019  . Hypercholesterolemia   . Hypertension   . 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 Wears dentures    full upper and lower   Past Surgical History:  Procedure Laterality Date  . CATARACT EXTRACTION W/PHACO Right 01/19/2020   Procedure: CATARACT EXTRACTION PHACO AND INTRAOCULAR LENS PLACEMENT ([place]) RIGHT VISION BLUE 8.60  00:58.6;  Surgeon: Marchia Meiers, MD;  Location: [place];  Service: Ophthalmology;  Laterality: Right;  . COLECTOMY WITH COLOSTOMY CREATION/HARTMANN PROCEDURE N/A 07/03/2017   Procedure: COLECTOMY WITH COLOSTOMY CREATION/HARTMANN PROCEDURE;  Surgeon: Florene Glen, MD;  Location: [facility];  Service: General;  Laterality: N/A;  . COLONOSCOPY WITH PROPOFOL N/A 10/27/2017   Procedure: COLONOSCOPY WITH PROPOFOL through colostomy;  Surgeon: Lucilla Lame, MD;  Location: [facility];  Service: Endoscopy;  Laterality: N/A;  . COLOSTOMY CLOSURE N/A 11/17/2017   Procedure: COLOSTOMY CLOSURE;  Surgeon: Burt Knack,  Jerrol Banana, MD;  Location: [facility];  Service: General;  Laterality: N/A;  . CORONARY ARTERY BYPASS GRAFT  1999   4 vessels  . excision of skin cancer    . KNEE ARTHROSCOPY W/ MENISCAL REPAIR      Family History  Problem Relation Age of Onset  . Lung disease Father 98   Social History   Socioeconomic History  . Marital status: Married    Spouse name: Not on file  . Number of children: 2  . Years of education: Not on file  . Highest education level: Not on file  Occupational History  . Occupation: Print production planner  Tobacco Use  . Smoking status: Former Smoker    Types: Cigars    Quit date: 09/29/1997    Years since quitting: 22.7  .  Smokeless tobacco: Former Systems developer    Types: [place] date: 1999  Media planner  . Vaping Use: Never used  Substance and Sexual Activity  . Alcohol use: Yes    Alcohol/week: 1.0 standard drink    Types: 1 Shots of liquor per week    Comment: one time monthly  . Drug use: No  . Sexual activity: Yes    Partners: Female  Other Topics Concern  . Not on file  Social History Narrative  . Not on file   Social Determinants of Health   Financial Resource Strain:   . Difficulty of Paying Living Expenses: Not on file  Food Insecurity:   . Worried About Charity fundraiser in the Last Year: Not on file  . Ran Out of Food in the Last Year: Not on file  Transportation Needs:   . Lack of Transportation (Medical): Not on file  . Lack of Transportation (Non-Medical): Not on file  Physical Activity:   . Days of Exercise per Week: Not on file  . Minutes of Exercise per Session: Not on file  Stress:   . Feeling of Stress : Not on file  Social Connections:   . Frequency of Communication with Friends and Family: Not on file  . Frequency of Social Gatherings with Friends and Family: Not on file  . Attends Religious Services: Not on file  . Active Member of Clubs or Organizations: Not on file  . Attends Archivist Meetings: Not on file  . Marital Status: Not on file    Review of Systems  Constitutional: Negative.   HENT: Negative.   Eyes: Negative.   Respiratory: Negative for cough and shortness of breath.   Cardiovascular: Negative for chest pain, palpitations and leg swelling.  Gastrointestinal: Negative.   Endocrine: Negative.   Genitourinary: Negative.   Musculoskeletal: Negative.   Neurological: Negative.      Objective:  BP 110/70   Pulse 74   Temp 98 F (36.7 C)   Resp 16   Ht 5\' 9"  (1.753 m)   Wt 230 lb (104.3 kg)   BMI 33.97 kg/m   BP/Weight 07/03/2020 0/62/3762 05/01/1516  Systolic BP 616 073 710  Diastolic BP 70 84 78  Wt. (Lbs) 230 227.6 230.2  BMI 33.97  33.61 33.99    Physical Exam Constitutional:      Appearance: He is normal weight.  HENT:     Head: Normocephalic.     Right Ear: Tympanic membrane normal.     Left Ear: Tympanic membrane normal.     Mouth/Throat:     Mouth: Mucous membranes are moist.     Pharynx:  Oropharynx is clear.  Eyes:     Extraocular Movements: Extraocular movements intact.     Conjunctiva/sclera: Conjunctivae normal.     Pupils: Pupils are equal, round, and reactive to [place].  Cardiovascular:     Rate and Rhythm: Normal rate and regular rhythm.     Pulses: Normal pulses.     Heart sounds: Normal heart sounds.  Pulmonary:     Effort: Pulmonary effort is normal.     Breath sounds: Normal breath sounds.  Abdominal:     General: Abdomen is flat. Bowel sounds are normal.     Palpations: Abdomen is soft.  Musculoskeletal:        General: Normal range of motion.  Skin:    General: Skin is warm.     Capillary Refill: Capillary refill takes less than 2 seconds.     Comments: the wound on left posterior leg is well healed.  Neurological:     Mental Status: Mental status is at baseline.  Psychiatric:        Mood and Affect: Mood normal.       Lab Results  Component Value Date   WBC 4.2 06/12/2020   HGB 14.9 06/12/2020   HCT 44.5 06/12/2020   PLT 162 06/12/2020   GLUCOSE 102 (H) 06/12/2020   CHOL 119 06/12/2020   TRIG 54 06/12/2020   HDL 43 06/12/2020   LDLCALC 64 06/12/2020   ALT 34 06/12/2020   AST 39 06/12/2020   NA 141 06/12/2020   K 4.9 06/12/2020   CL 103 06/12/2020   CREATININE 0.84 06/12/2020   BUN 19 06/12/2020   CO2 24 06/12/2020   TSH 3.48 04/08/2013   INR 1.99 07/01/2017      Assessment & Plan:   1. Chronic ulcer of left leg, limited to breakdown of skin (HCC) The wound has healed - 2. Need for vaccination - Tdap vaccine greater than or equal to 7yo IM - Flu Vaccine QUAD High Dose(Fluad)         Follow-up: Return in about 1 week (around 07/10/2020) for  wound.  An After Visit Summary was printed and given to the patient.  [facility] [phone]

## 2020-08-20 ENCOUNTER — Telehealth: Payer: Self-pay | Admitting: Cardiology

## 2020-08-20 NOTE — Telephone Encounter (Signed)
Patient calling the office for samples of medication:   1.  What medication and dosage are you requesting samples for? rivaroxaban (XARELTO) 20 MG TABS tablet  2.  Are you currently out of this medication? Patient has enough for this week and part of next week. Requesting a months worth

## 2020-08-20 NOTE — Telephone Encounter (Signed)
Patient aware samples are at the front desk for pick up  

## 2020-11-20 DIAGNOSIS — H2512 Age-related nuclear cataract, left eye: Secondary | ICD-10-CM | POA: Diagnosis not present

## 2020-12-12 ENCOUNTER — Encounter: Payer: Self-pay | Admitting: Legal Medicine

## 2020-12-12 DIAGNOSIS — G720 Drug-induced myopathy: Secondary | ICD-10-CM | POA: Insufficient documentation

## 2020-12-13 ENCOUNTER — Other Ambulatory Visit: Payer: Self-pay

## 2020-12-13 ENCOUNTER — Encounter: Payer: Self-pay | Admitting: Legal Medicine

## 2020-12-13 ENCOUNTER — Ambulatory Visit (INDEPENDENT_AMBULATORY_CARE_PROVIDER_SITE_OTHER): Payer: PPO | Admitting: Legal Medicine

## 2020-12-13 VITALS — BP 136/88 | HR 78 | Temp 97.6°F | Ht 69.0 in | Wt 238.0 lb

## 2020-12-13 DIAGNOSIS — I10 Essential (primary) hypertension: Secondary | ICD-10-CM | POA: Diagnosis not present

## 2020-12-13 DIAGNOSIS — M791 Myalgia, unspecified site: Secondary | ICD-10-CM | POA: Diagnosis not present

## 2020-12-13 DIAGNOSIS — I25708 Atherosclerosis of coronary artery bypass graft(s), unspecified, with other forms of angina pectoris: Secondary | ICD-10-CM

## 2020-12-13 DIAGNOSIS — I4811 Longstanding persistent atrial fibrillation: Secondary | ICD-10-CM | POA: Diagnosis not present

## 2020-12-13 DIAGNOSIS — L97921 Non-pressure chronic ulcer of unspecified part of left lower leg limited to breakdown of skin: Secondary | ICD-10-CM

## 2020-12-13 DIAGNOSIS — E7800 Pure hypercholesterolemia, unspecified: Secondary | ICD-10-CM | POA: Diagnosis not present

## 2020-12-13 NOTE — Progress Notes (Addendum)
Subjective:  Patient ID: Ryan Mccall, male    DOB: 06-06-1946  Age: 75 y.o. MRN: [id number]  Chief Complaint  Patient presents with  . Atrial Fibrillation  . Hyperlipidemia  . Hypertension    HPI: chronic visit  Patient presents for follow up of hypertension.  Patient tolerating spirolactone well with side effects.  Patient was diagnosed with hypertension 2010 so has been treated for hypertension for 10 years.Patient is working on maintaining diet and exercise regimen and follows up as directed. Complication include nne.  Patient presents with hyperlipidemia.  Compliance with treatment has been good; patient takes medicines as directed, maintains low cholesterol diet, follows up as directed, and maintains exercise regimen.  Patient is using simvastatin qod and zetia without problems.  Patient has a diagnosis of permanent atrial fibrillation.   Patient is on xarelto and has controlled ventricular response.  Patient is CV stable.   Current Outpatient Medications on File Prior to Visit  Medication Sig Dispense Refill  . carvedilol (COREG) 6.25 MG tablet Take 1 tablet (6.25 mg total) by mouth 2 (two) times daily. 180 tablet 3  . clotrimazole-betamethasone (LOTRISONE) cream     . COD LIVER OIL PO Take 1 capsule by mouth 3 (three) times a week.     . diclofenac sodium (VOLTAREN) 1 % GEL Apply 1 application topically daily as needed (joint pain).    Marland Kitchen ezetimibe (ZETIA) 10 MG tablet Take 1 tablet (10 mg total) by mouth at bedtime. 90 tablet 1  . feeding supplement, ENSURE ENLIVE, (ENSURE ENLIVE) LIQD Take 237 mLs by mouth 2 (two) times daily between meals. (Patient taking differently: Take 237 mLs by mouth daily. ) 237 mL 12  . fexofenadine (ALLEGRA) 180 MG tablet Take 180 mg by mouth daily as needed for allergies.     . fish oil-omega-3 fatty acids 1000 MG capsule Take 1 g by mouth 3 (three) times a week.     Marland Kitchen guaiFENesin-codeine (CHERATUSSIN AC) 100-10 MG/5ML syrup Take 5 mLs by mouth 3  (three) times daily as needed for cough. 120 mL 0  . nitroGLYCERIN (NITROSTAT) 0.4 MG SL tablet Place 1 tablet (0.4 mg total) under the tongue as needed for chest pain. May repeat in 15 minutes up to 3 times for chest pain. 30 tablet 2  . rivaroxaban (XARELTO) 20 MG TABS tablet Take 1 tablet (20 mg total) by mouth daily with supper. 90 tablet 3  . silver sulfADIAZINE (SILVADENE) 1 % cream Apply 1 application topically daily. 50 g 2  . simvastatin (ZOCOR) 10 MG tablet Take 1 tablet (10 mg total) by mouth at bedtime. 90 tablet 2  . spironolactone (ALDACTONE) 25 MG tablet Take 0.5 tablets (12.5 mg total) by mouth daily. 45 tablet 3  . UNABLE TO FIND 1 tablet as needed. Med Name: cardio-plus     No current facility-administered medications on file prior to visit.   Past Medical History:  Diagnosis Date  . Atrial fibrillation ([place])   . Coronary artery disease    s/p remote CABG 1999 per Dr. Cyndia Bent;  Lexiscan Myoview (01/2014):  No ischemia, not gated, normal study  . COVID-19 03/2019  . Hypercholesterolemia   . Hypertension   . 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 Wears dentures    full upper and lower   Past Surgical History:  Procedure Laterality Date  . CATARACT EXTRACTION W/PHACO Right 01/19/2020   Procedure: CATARACT EXTRACTION PHACO AND INTRAOCULAR LENS PLACEMENT (IOC)  RIGHT VISION BLUE 8.60  00:58.6;  Surgeon: Marchia Meiers, MD;  Location: [place];  Service: Ophthalmology;  Laterality: Right;  . COLECTOMY WITH COLOSTOMY CREATION/HARTMANN PROCEDURE N/A 07/03/2017   Procedure: COLECTOMY WITH COLOSTOMY CREATION/HARTMANN PROCEDURE;  Surgeon: Florene Glen, MD;  Location: [facility];  Service: General;  Laterality: N/A;  . COLONOSCOPY WITH PROPOFOL N/A 10/27/2017   Procedure: COLONOSCOPY WITH PROPOFOL through colostomy;  Surgeon: Lucilla Lame, MD;  Location: [facility];  Service: Endoscopy;  Laterality: N/A;  . COLOSTOMY CLOSURE N/A 11/17/2017   Procedure:  COLOSTOMY CLOSURE;  Surgeon: Florene Glen, MD;  Location: [facility];  Service: General;  Laterality: N/A;  . CORONARY ARTERY BYPASS GRAFT  1999   4 vessels  . excision of skin cancer    . KNEE ARTHROSCOPY W/ MENISCAL REPAIR      Family History  Problem Relation Age of Onset  . Lung disease Father 35   Social History   Socioeconomic History  . Marital status: Married    Spouse name: Not on file  . Number of children: 2  . Years of education: Not on file  . Highest education level: Not on file  Occupational History  . Occupation: Print production planner  Tobacco Use  . Smoking status: Former Smoker    Types: Cigars    Quit date: 09/29/1997    Years since quitting: 23.2  . Smokeless tobacco: Former Systems developer    Types: [place] date: 1999  Media planner  . Vaping Use: Never used  Substance and Sexual Activity  . Alcohol use: Yes    Alcohol/week: 1.0 standard drink    Types: 1 Shots of liquor per week    Comment: one time monthly  . Drug use: No  . Sexual activity: Yes    Partners: Female  Other Topics Concern  . Not on file  Social History Narrative  . Not on file   Social Determinants of Health   Financial Resource Strain: Not on file  Food Insecurity: Not on file  Transportation Needs: Not on file  Physical Activity: Not on file  Stress: Not on file  Social Connections: Not on file    Review of Systems  Constitutional: Negative for chills, diaphoresis, fatigue and fever.  HENT: Positive for congestion and rhinorrhea. Negative for ear pain and sore throat.   Eyes: Negative for visual disturbance.  Respiratory: Negative for cough and shortness of breath.   Cardiovascular: Negative for chest pain and leg swelling.  Gastrointestinal: Negative for abdominal pain, constipation, diarrhea, nausea and vomiting.  Endocrine: Negative for polyuria.  Genitourinary: Negative for dysuria and urgency.  Musculoskeletal: Negative for arthralgias and myalgias.   Allergic/Immunologic: Positive for environmental allergies.  Neurological: Negative for dizziness and headaches.  Psychiatric/Behavioral: Negative for dysphoric mood.     Objective:  BP 136/88   Pulse 78   Temp 97.6 F (36.4 C)   Ht 5\' 9"  (1.753 m)   Wt 238 lb (108 kg)   SpO2 98%   BMI 35.15 kg/m   BP/Weight 12/13/2020 07/10/2020 73/12/1935  Systolic BP 902 409 735  Diastolic BP 88 78 70  Wt. (Lbs) 238 228.4 230  BMI 35.15 33.73 33.97    Physical Exam Vitals reviewed.  Constitutional:      Appearance: Normal appearance. He is normal weight.  HENT:     Head: Normocephalic and atraumatic.     Right Ear: Tympanic membrane normal.     Nose: Nose normal.  Mouth/Throat:     Mouth: Mucous membranes are moist.     Pharynx: Oropharynx is clear.  Eyes:     Conjunctiva/sclera: Conjunctivae normal.     Pupils: Pupils are equal, round, and reactive to [place].  Cardiovascular:     Rate and Rhythm: Normal rate and regular rhythm.     Pulses: Normal pulses.     Heart sounds: Normal heart sounds. No murmur heard. No gallop.   Pulmonary:     Effort: Pulmonary effort is normal. No respiratory distress.     Breath sounds: Normal breath sounds. No rales.  Abdominal:     General: Abdomen is flat. Bowel sounds are normal. There is no distension.     Tenderness: There is no abdominal tenderness.  Musculoskeletal:        General: No tenderness. Normal range of motion.     Cervical back: Normal range of motion and neck supple.  Skin:    General: Skin is warm and dry.  Neurological:     General: No focal deficit present.     Mental Status: He is oriented to person, place, and time.  Psychiatric:        Mood and Affect: Mood normal.       Lab Results  Component Value Date   WBC 3.9 12/13/2020   HGB 14.5 12/13/2020   HCT 43.0 12/13/2020   PLT 148 (L) 12/13/2020   GLUCOSE 98 12/13/2020   CHOL 116 12/13/2020   TRIG 77 12/13/2020   HDL 39 (L) 12/13/2020   LDLCALC 61  12/13/2020   ALT 56 (H) 12/13/2020   AST 59 (H) 12/13/2020   NA 140 12/13/2020   K 4.6 12/13/2020   CL 104 12/13/2020   CREATININE 0.82 12/13/2020   BUN 18 12/13/2020   CO2 22 12/13/2020   TSH 3.48 04/08/2013   INR 1.99 07/01/2017      Assessment & Plan:  Diagnoses and all orders for this visit: Longstanding persistent atrial fibrillation [facility]) Patient has a diagnosis of permanent atrial fibrillation.   Patient is on xarelto and has controlled ventricular response.  Patient is CV stable . Coronary artery disease involving coronary bypass graft of native heart with other forms of angina pectoris [facility]) Patient's CAD was assessed using history and physical along with other information to maximize treatment.  Evidence based criteria was use in deciding proper management for this disease process.  Patient's CAD is under good control.therapy continue present treatment . Primary hypertension -     CBC with Differential/Platelet -     Comprehensive metabolic panel An individual hypertension care plan was established and reinforced today.  The patient's status was assessed using clinical findings on exam and labs or diagnostic tests. The patient's success at meeting treatment goals on disease specific evidence-based guidelines and found to be well controlled. SELF MANAGEMENT: The patient and I together assessed ways to personally work towards obtaining the recommended goals. RECOMMENDATIONS: avoid decongestants found in common cold remedies, decrease consumption of alcohol, perform routine monitoring of BP with home BP cuff, exercise, reduction of dietary salt, take medicines as prescribed, try not to miss doses and quit smoking.  Regular exercise and maintaining a healthy weight is needed.  Stress reduction may help. A CLINICAL SUMMARY including written plan identify barriers to care unique to individual due to social or financial issues.  We attempt to mutually creat solutions for individual  and family understanding.  Hypercholesterolemia -     Lipid panel AN INDIVIDUAL CARE PLAN  for hyperlipidemia/ cholesterol was established and reinforced today.  The patient's status was assessed using clinical findings on exam, lab and other diagnostic tests. The patient's disease status was assessed based on evidence-based guidelines and found to be fair controlled. MEDICATIONS were reviewed. SELF MANAGEMENT GOALS have been discussed and patient's success at attaining the goal of low cholesterol was assessed. RECOMMENDATION given include regular exercise 3 days a week and low cholesterol/low fat diet. CLINICAL SUMMARY including written plan to identify barriers unique to the patient due to social or economic  reasons was discussed.  Chronic ulcer of left leg, limited to breakdown of skin (HCC) Ulcer is healed   M79.10: patient has malgia due to statin therapy at higher doses       I spent 30 minutes dedicated to the care of this patient on the date of this encounter to include face-to-face time with the patient, as well as: review chart  Follow-up: Return in about 6 months (around 06/15/2021) for fasting.  An After Visit Summary was printed and given to the patient.  [name], MD [facility] [phone]

## 2020-12-13 NOTE — Assessment & Plan Note (Signed)
Closed 2019

## 2020-12-14 LAB — CBC WITH DIFFERENTIAL/PLATELET
Basophils Absolute: 0 10*3/uL (ref 0.0–0.2)
Basos: 1 %
EOS (ABSOLUTE): 0.1 10*3/uL (ref 0.0–0.4)
Eos: 3 %
Hematocrit: 43 % (ref 37.5–51.0)
Hemoglobin: 14.5 g/dL (ref 13.0–17.7)
Immature Grans (Abs): 0 10*3/uL (ref 0.0–0.1)
Immature Granulocytes: 0 %
Lymphocytes Absolute: 1.3 10*3/uL (ref 0.7–3.1)
Lymphs: 34 %
MCH: 30.3 pg (ref 26.6–33.0)
MCHC: 33.7 g/dL (ref 31.5–35.7)
MCV: 90 fL (ref 79–97)
Monocytes Absolute: 0.4 10*3/uL (ref 0.1–0.9)
Monocytes: 11 %
Neutrophils Absolute: 1.9 10*3/uL (ref 1.4–7.0)
Neutrophils: 51 %
Platelets: 148 10*3/uL — ABNORMAL LOW (ref 150–450)
RBC: 4.79 x10E6/uL (ref 4.14–5.80)
RDW: 14.2 % (ref 11.6–15.4)
WBC: 3.9 10*3/uL (ref 3.4–10.8)

## 2020-12-14 LAB — COMPREHENSIVE METABOLIC PANEL
ALT: 56 IU/L — ABNORMAL HIGH (ref 0–44)
AST: 59 IU/L — ABNORMAL HIGH (ref 0–40)
Albumin/Globulin Ratio: 1.9 (ref 1.2–2.2)
Albumin: 4.4 g/dL (ref 3.7–4.7)
Alkaline Phosphatase: 56 IU/L (ref 44–121)
BUN/Creatinine Ratio: 22 (ref 10–24)
BUN: 18 mg/dL (ref 8–27)
Bilirubin Total: 0.6 mg/dL (ref 0.0–1.2)
CO2: 22 mmol/L (ref 20–29)
Calcium: 9.2 mg/dL (ref 8.6–10.2)
Chloride: 104 mmol/L (ref 96–106)
Creatinine, Ser: 0.82 mg/dL (ref 0.76–1.27)
Globulin, Total: 2.3 g/dL (ref 1.5–4.5)
Glucose: 98 mg/dL (ref 65–99)
Potassium: 4.6 mmol/L (ref 3.5–5.2)
Sodium: 140 mmol/L (ref 134–144)
Total Protein: 6.7 g/dL (ref 6.0–8.5)
eGFR: 93 mL/min/{1.73_m2} (ref >59)

## 2020-12-14 LAB — CARDIOVASCULAR RISK ASSESSMENT

## 2020-12-14 LAB — LIPID PANEL
Chol/HDL Ratio: 3 ratio (ref 0.0–5.0)
Cholesterol, Total: 116 mg/dL (ref 100–199)
HDL: 39 mg/dL — ABNORMAL LOW (ref >39)
LDL Chol Calc (NIH): 61 mg/dL (ref 0–99)
Triglycerides: 77 mg/dL (ref 0–149)
VLDL Cholesterol Cal: 16 mg/dL (ref 5–40)

## 2020-12-14 NOTE — Progress Notes (Signed)
CBC normal, platelets slightly low ?, glucose 102, kidney tests normal, liver tests slightly high, cholesterol good lp

## 2020-12-17 ENCOUNTER — Telehealth: Payer: Self-pay

## 2020-12-17 ENCOUNTER — Encounter: Payer: Self-pay | Admitting: Legal Medicine

## 2020-12-17 ENCOUNTER — Other Ambulatory Visit: Payer: Self-pay | Admitting: Legal Medicine

## 2020-12-17 DIAGNOSIS — M791 Myalgia, unspecified site: Secondary | ICD-10-CM | POA: Insufficient documentation

## 2020-12-17 NOTE — Progress Notes (Addendum)
                                         [facility])                                            [place] Team                                        Statin Quality Measure Assessment    12/17/2020  Ryan Mccall Feb 28, 1946 [id number]  I am a [facility] clinical pharmacist that reviews patients for statin quality initiatives.     Per review of chart and payor information, patient has a diagnosis of cardiovascular disease and has a prescription for Simvastatin 10 mg QHS. However, patient reports taking Simvastatin 10 mg every other day (~4 times per week) and Dr. Henrene Pastor was made aware. Therefore, he has ~ 1.5 bottles of tablets remaining. This places patient into the [facility] (Statin Use In Patients with Cardiovascular Disease) measure for CMS.  Patient was seen on 12/13/20 and medication allergy list updated to include myalgia 2/2 to statin, but no diagnosis code included in encounter.       Component Value Date/Time   CHOL 116 12/13/2020 0756   TRIG 77 12/13/2020 0756   HDL 39 (L) 12/13/2020 0756   CHOLHDL 3.0 12/13/2020 0756   CHOLHDL 3 01/23/2014 0942   VLDL 14.2 01/23/2014 0942   LDLCALC 61 12/13/2020 0756     Please consider  the following recommendations:   New prescription adjusting Simvastatin to 20 mg, one-half tablet QOD #26 # (90 DS w/3 refills) - this will remove the patient from the [facility] measure once filled.  Adding dx code M79.1 to next office visit on 06/14/2021  Please let us know your decision.     Thank you for allowing the [place] Team to be a part of this patient's care.  Kristeen Miss, PharmD Clinical Pharmacist [place] Cell: [phone]

## 2020-12-18 ENCOUNTER — Telehealth: Payer: Self-pay

## 2020-12-18 ENCOUNTER — Telehealth: Payer: Self-pay | Admitting: Legal Medicine

## 2020-12-18 DIAGNOSIS — I10 Essential (primary) hypertension: Secondary | ICD-10-CM

## 2020-12-18 DIAGNOSIS — I25708 Atherosclerosis of coronary artery bypass graft(s), unspecified, with other forms of angina pectoris: Secondary | ICD-10-CM

## 2020-12-18 NOTE — Progress Notes (Signed)
  Chronic Care Management   Note  12/18/2020 Name: LATONYA KNIGHT MRN: [id number] DOB: [birth year]  SANCHEZ HEMMER is a 75 y.o. year old male who is a primary care patient of Lillard Anes, MD. I reached out to [name] by phone today in response to a referral sent by Mr. Coe Angelos Weipert's PCP, Lillard Anes, MD.   Mr. Starks was given information about Chronic Care Management services today including:  1. CCM service includes personalized support from designated clinical staff supervised by his physician, including individualized plan of care and coordination with other care providers 2. 24/7 contact phone numbers for assistance for urgent and routine care needs. 3. Service will only be billed when office clinical staff spend 20 minutes or more in a month to coordinate care. 4. Only one practitioner may furnish and bill the service in a calendar month. 5. The patient may stop CCM services at any time (effective at the end of the month) by phone call to the office staff.   Patient agreed to services and verbal consent obtained.   Follow up plan:   Carley Perdue [employer] Scheduler

## 2020-12-18 NOTE — Telephone Encounter (Signed)
CCM Referral for Ryan Mccall

## 2020-12-26 DIAGNOSIS — H2512 Age-related nuclear cataract, left eye: Secondary | ICD-10-CM | POA: Diagnosis not present

## 2021-01-02 ENCOUNTER — Encounter: Payer: Self-pay | Admitting: Ophthalmology

## 2021-01-07 ENCOUNTER — Other Ambulatory Visit: Admission: RE | Admit: 2021-01-07 | Payer: PPO | Source: Ambulatory Visit

## 2021-01-08 NOTE — Discharge Instructions (Signed)

## 2021-01-09 ENCOUNTER — Ambulatory Visit: Payer: PPO | Admitting: Anesthesiology

## 2021-01-09 ENCOUNTER — Encounter
Admission: RE | Disposition: A | Discharge status: Home / Self Care | Payer: Self-pay | Source: Home / Self Care | Attending: Ophthalmology

## 2021-01-09 ENCOUNTER — Other Ambulatory Visit: Payer: Self-pay

## 2021-01-09 ENCOUNTER — Encounter: Payer: Self-pay | Admitting: Ophthalmology

## 2021-01-09 ENCOUNTER — Ambulatory Visit
Admission: RE | Admit: 2021-01-09 | Discharge: 2021-01-09 | Disposition: A | Discharge status: Home / Self Care | Payer: PPO | Attending: Ophthalmology | Admitting: Ophthalmology

## 2021-01-09 DIAGNOSIS — Z85828 Personal history of other malignant neoplasm of skin: Secondary | ICD-10-CM | POA: Insufficient documentation

## 2021-01-09 DIAGNOSIS — H2512 Age-related nuclear cataract, left eye: Secondary | ICD-10-CM | POA: Insufficient documentation

## 2021-01-09 DIAGNOSIS — Z87891 Personal history of nicotine dependence: Secondary | ICD-10-CM | POA: Diagnosis not present

## 2021-01-09 DIAGNOSIS — Z951 Presence of aortocoronary bypass graft: Secondary | ICD-10-CM | POA: Diagnosis not present

## 2021-01-09 DIAGNOSIS — H25812 Combined forms of age-related cataract, left eye: Secondary | ICD-10-CM | POA: Diagnosis not present

## 2021-01-09 DIAGNOSIS — Z8616 Personal history of COVID-19: Secondary | ICD-10-CM | POA: Insufficient documentation

## 2021-01-09 HISTORY — PX: CATARACT EXTRACTION W/PHACO: SHX586

## 2021-01-09 SURGERY — PHACOEMULSIFICATION, CATARACT, WITH IOL INSERTION
Anesthesia: Monitor Anesthesia Care | Site: Eye | Laterality: Left

## 2021-01-09 MED ORDER — NA HYALUR & NA CHOND-NA HYALUR 0.4-0.35 ML IO KIT
PACK | INTRAOCULAR | Status: DC | PRN
  Administered 2021-01-09: 1 mL via INTRAOCULAR

## 2021-01-09 MED ORDER — EPINEPHRINE PF 1 MG/ML IJ SOLN
INTRAOCULAR | Status: DC | PRN
  Administered 2021-01-09: 71 mL via OPHTHALMIC

## 2021-01-09 MED ORDER — ARMC OPHTHALMIC DILATING DROPS
1.0000 "application " | OPHTHALMIC | Status: DC | PRN
  Administered 2021-01-09 (×3): 1 via OPHTHALMIC

## 2021-01-09 MED ORDER — FENTANYL CITRATE (PF) 100 MCG/2ML IJ SOLN
INTRAMUSCULAR | Status: DC | PRN
  Administered 2021-01-09: 50 ug via INTRAVENOUS

## 2021-01-09 MED ORDER — LIDOCAINE HCL (PF) 2 % IJ SOLN
INTRAOCULAR | Status: DC | PRN
  Administered 2021-01-09: 1 mL

## 2021-01-09 MED ORDER — TETRACAINE HCL 0.5 % OP SOLN
1.0000 [drp] | OPHTHALMIC | Status: DC | PRN
  Administered 2021-01-09 (×3): 1 [drp] via OPHTHALMIC

## 2021-01-09 MED ORDER — BRIMONIDINE TARTRATE-TIMOLOL 0.2-0.5 % OP SOLN
OPHTHALMIC | Status: DC | PRN
  Administered 2021-01-09: 1 [drp] via OPHTHALMIC

## 2021-01-09 MED ORDER — CEFUROXIME OPHTHALMIC INJECTION 1 MG/0.1 ML
INJECTION | OPHTHALMIC | Status: DC | PRN
  Administered 2021-01-09: 0.1 mL via INTRACAMERAL

## 2021-01-09 MED ORDER — MIDAZOLAM HCL 2 MG/2ML IJ SOLN
INTRAMUSCULAR | Status: DC | PRN
  Administered 2021-01-09: 1.5 mg via INTRAVENOUS

## 2021-01-09 SURGICAL SUPPLY — 26 items
CANNULA ANT/CHMB 27G (MISCELLANEOUS) ×1 IMPLANT
CANNULA ANT/CHMB 27GA (MISCELLANEOUS) ×2 IMPLANT
GLOVE SURG TRIUMPH 8.0 PF LTX (GLOVE) ×4 IMPLANT
GOWN STRL REUS W/ TWL LRG LVL3 (GOWN DISPOSABLE) ×2 IMPLANT
GOWN STRL REUS W/TWL LRG LVL3 (GOWN DISPOSABLE) ×6
LENS IOL TECNIS EYHANCE 22.5 (Intraocular Lens) ×1 IMPLANT
MARKER SKIN DUAL TIP RULER LAB (MISCELLANEOUS) ×2 IMPLANT
NDL CAPSULORHEX 25GA (NEEDLE) ×1 IMPLANT
NDL FILTER BLUNT 18X1 1/2 (NEEDLE) ×2 IMPLANT
NDL RETROBULBAR .5 NSTRL (NEEDLE) IMPLANT
NEEDLE CAPSULORHEX 25GA (NEEDLE) ×2 IMPLANT
NEEDLE FILTER BLUNT 18X 1/2SAF (NEEDLE) ×2
NEEDLE FILTER BLUNT 18X1 1/2 (NEEDLE) ×2 IMPLANT
PACK CATARACT BRASINGTON (MISCELLANEOUS) ×2 IMPLANT
PACK EYE AFTER SURG (MISCELLANEOUS) ×2 IMPLANT
PACK OPTHALMIC (MISCELLANEOUS) ×2 IMPLANT
RING MALYGIN 7.0 (MISCELLANEOUS) IMPLANT
SOLUTION OPHTHALMIC SALT (MISCELLANEOUS) ×2 IMPLANT
SUT ETHILON 10-0 CS-B-6CS-B-6 (SUTURE)
SUT VICRYL  9 0 (SUTURE)
SUT VICRYL 9 0 (SUTURE) IMPLANT
SUTURE EHLN 10-0 CS-B-6CS-B-6 (SUTURE) IMPLANT
SYR 3ML LL SCALE MARK (SYRINGE) ×4 IMPLANT
SYR TB 1ML LUER SLIP (SYRINGE) ×2 IMPLANT
WATER STERILE IRR 250ML POUR (IV SOLUTION) ×2 IMPLANT
WIPE NON LINTING 3.25X3.25 (MISCELLANEOUS) ×2 IMPLANT

## 2021-01-09 NOTE — Transfer of Care (Signed)
Immediate Anesthesia Transfer of Care Note  Patient: Ryan Mccall  Procedure(s) Performed: CATARACT EXTRACTION PHACO AND INTRAOCULAR LENS PLACEMENT (IOC) LEFT (Left Eye)  Patient Location: PACU  Anesthesia Type: MAC  Level of Consciousness: awake, alert  and patient cooperative  Airway and Oxygen Therapy: Patient Spontanous Breathing and Patient connected to supplemental oxygen  Post-op Assessment: Post-op Vital signs reviewed, Patient's Cardiovascular Status Stable, Respiratory Function Stable, Patent Airway and No signs of Nausea or vomiting  Post-op Vital Signs: Reviewed and stable  Complications: No complications documented.

## 2021-01-09 NOTE — H&P (Signed)
[facility]   Primary Care Physician:  Lillard Anes, MD Ophthalmologist: Dr. Leandrew Koyanagi  Pre-Procedure History & Physical: HPI:  Ryan Mccall is a 75 y.o. male here for ophthalmic surgery.   Past Medical History:  Diagnosis Date  . Atrial fibrillation ([place])   . Coronary artery disease    s/p remote CABG 1999 per Dr. Cyndia Bent;  Lexiscan Myoview (01/2014):  No ischemia, not gated, normal study  . COVID-19 03/2019  . Hypercholesterolemia   . Hypertension   . 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 Wears dentures    full upper and lower    Past Surgical History:  Procedure Laterality Date  . CATARACT EXTRACTION W/PHACO Right 01/19/2020   Procedure: CATARACT EXTRACTION PHACO AND INTRAOCULAR LENS PLACEMENT ([place]) RIGHT VISION BLUE 8.60  00:58.6;  Surgeon: Marchia Meiers, MD;  Location: [place];  Service: Ophthalmology;  Laterality: Right;  . COLECTOMY WITH COLOSTOMY CREATION/HARTMANN PROCEDURE N/A 07/03/2017   Procedure: COLECTOMY WITH COLOSTOMY CREATION/HARTMANN PROCEDURE;  Surgeon: Florene Glen, MD;  Location: [facility];  Service: General;  Laterality: N/A;  . COLONOSCOPY WITH PROPOFOL N/A 10/27/2017   Procedure: COLONOSCOPY WITH PROPOFOL through colostomy;  Surgeon: Lucilla Lame, MD;  Location: [facility];  Service: Endoscopy;  Laterality: N/A;  . COLOSTOMY CLOSURE N/A 11/17/2017   Procedure: COLOSTOMY CLOSURE;  Surgeon: Florene Glen, MD;  Location: [facility];  Service: General;  Laterality: N/A;  . CORONARY ARTERY BYPASS GRAFT  1999   4 vessels  . excision of skin cancer    . KNEE ARTHROSCOPY W/ MENISCAL REPAIR      Prior to Admission medications   Medication Sig Start Date End Date Taking? Authorizing Provider  carvedilol (COREG) 6.25 MG tablet Take 1 tablet (6.25 mg total) by mouth 2 (two) times daily. 07/04/20  Yes [name], [name] M, MD  clotrimazole-betamethasone Donalynn Furlong) cream  12/13/19  Yes [provider]  COD LIVER OIL PO Take 1 capsule by mouth 3 (three) times a week.   Yes [provider]  diclofenac sodium (VOLTAREN) 1 % GEL Apply 1 application topically daily as needed (joint pain).   Yes [provider]  ezetimibe (ZETIA) 10 MG tablet Take 1 tablet (10 mg total) by mouth at bedtime. 12/20/19  Yes Lillard Anes, MD  feeding supplement, ENSURE ENLIVE, (ENSURE ENLIVE) LIQD Take 237 mLs by mouth 2 (two) times daily between meals. Patient taking differently: Take 237 mLs by mouth daily. 11/25/17  Yes Piscoya, Jacqulyn Bath, MD  fexofenadine (ALLEGRA) 180 MG tablet Take 180 mg by mouth daily as needed for allergies.   Yes [provider]  rivaroxaban (XARELTO) 20 MG TABS tablet Take 1 tablet (20 mg total) by mouth daily with supper. 01/27/20  Yes [name], [name] M, MD  silver sulfADIAZINE (SILVADENE) 1 % cream Apply 1 application topically daily. 07/10/20  Yes Lillard Anes, MD  simvastatin (ZOCOR) 10 MG tablet Take 1 tablet (10 mg total) by mouth at bedtime. 04/30/20  Yes Lillard Anes, MD  spironolactone (ALDACTONE) 25 MG tablet Take 0.5 tablets (12.5 mg total) by mouth daily. 07/04/20  Yes [name], [name] M, MD  nitroGLYCERIN (NITROSTAT) 0.4 MG SL tablet Place 1 tablet (0.4 mg total) under the tongue as needed for chest pain. May repeat in 15 minutes up to 3 times for chest pain. 07/04/20   Lillard Anes, MD    Allergies as of 11/20/2020 - Review Complete 07/10/2020  Allergen  Reaction Noted  . Shellfish allergy Nausea And Vomiting 09/03/2011    Family History  Problem Relation Age of Onset  . Lung disease Father 15    Social History   Socioeconomic History  . Marital status: Married    Spouse name: Not on file  . Number of children: 2  . Years of education: Not on file  . Highest education level: Not on file  Occupational History  . Occupation: Print production planner  Tobacco Use  . Smoking status: Former Smoker    Types: Cigars     Quit date: 09/29/1997    Years since quitting: 23.2  . Smokeless tobacco: Former Systems developer    Types: [place] date: 1999  Media planner  . Vaping Use: Never used  Substance and Sexual Activity  . Alcohol use: Yes    Alcohol/week: 1.0 standard drink    Types: 1 Shots of liquor per week    Comment: one time monthly  . Drug use: No  . Sexual activity: Yes    Partners: Female  Other Topics Concern  . Not on file  Social History Narrative  . Not on file   Social Determinants of Health   Financial Resource Strain: Not on file  Food Insecurity: Not on file  Transportation Needs: Not on file  Physical Activity: Not on file  Stress: Not on file  Social Connections: Not on file  Intimate Partner Violence: Not on file    Review of Systems: See HPI, otherwise negative ROS  Physical Exam: BP (!) 144/87   Pulse 66   Temp 98.2 F (36.8 C) (Temporal)   Wt 105.2 kg   SpO2 97%   BMI 34.26 kg/m  General:   Alert,  pleasant and cooperative in NAD Head:  Normocephalic and atraumatic. Lungs:  Clear to auscultation.    Heart:  Regular rate and rhythm.   Impression/Plan: Ryan Mccall is here for ophthalmic surgery.  Risks, benefits, limitations, and alternatives regarding ophthalmic surgery have been reviewed with the patient.  Questions have been answered.  All parties agreeable.   Leandrew Koyanagi, MD  01/09/2021, 12:59 PM

## 2021-01-09 NOTE — Anesthesia Preprocedure Evaluation (Signed)
Anesthesia Evaluation  Patient identified by MRN, date of birth, ID band Patient awake    History of Anesthesia Complications Negative for: history of anesthetic complications  Airway Mallampati: III  TM Distance: >3 FB Neck ROM: Full    Dental no notable dental hx.    Pulmonary former smoker,    Pulmonary exam normal        Cardiovascular Exercise Tolerance: Good hypertension, Pt. on medications and Pt. on home beta blockers + CABG (1999)  Normal cardiovascular exam+ dysrhythmias (chronic afib on Xarelto)  Rhythm:Irregular Rate:Normal     Neuro/Psych negative neurological ROS     GI/Hepatic negative GI ROS, Neg liver ROS,   Endo/Other  BMI 34  Renal/GU negative Renal ROS     Musculoskeletal   Abdominal   Peds  Hematology   Anesthesia Other Findings   Reproductive/Obstetrics                             Anesthesia Physical Anesthesia Plan  ASA: III  Anesthesia Plan: MAC   Post-op Pain Management:    Induction: Intravenous  PONV Risk Score and Plan: 1 and TIVA, Midazolam and Treatment may vary due to age or medical condition  Airway Management Planned: Nasal Cannula and Natural Airway  Additional Equipment: None  Intra-op Plan:   Post-operative Plan:   Informed Consent: I have reviewed the patients History and Physical, chart, labs and discussed the procedure including the risks, benefits and alternatives for the proposed anesthesia with the patient or authorized representative who has indicated his/her understanding and acceptance.       Plan Discussed with: CRNA  Anesthesia Plan Comments:         Anesthesia Quick Evaluation

## 2021-01-09 NOTE — Anesthesia Procedure Notes (Signed)
Procedure Name: MAC Performed by: Hau Sanor, CRNA Pre-anesthesia Checklist: Patient identified, Emergency Drugs available, Suction available, Timeout performed and Patient being monitored Patient Re-evaluated:Patient Re-evaluated prior to induction Oxygen Delivery Method: Nasal cannula Placement Confirmation: positive ETCO2       

## 2021-01-09 NOTE — Op Note (Signed)
  OPERATIVE NOTE  WELFORD CHRISTMAS [id number] 01/09/2021   PREOPERATIVE DIAGNOSIS:  Nuclear sclerotic cataract left eye. H25.12   POSTOPERATIVE DIAGNOSIS:    Nuclear sclerotic cataract left eye.     PROCEDURE:  Phacoemusification with posterior chamber intraocular lens placement of the left eye  Ultrasound time: Procedure(s) with comments: CATARACT EXTRACTION PHACO AND INTRAOCULAR LENS PLACEMENT (IOC) LEFT (Left) - 3.30 0:51.6 6.4%  LENS:   Implant Name Type Inv. Item Serial No. Manufacturer Lot No. LRB No. Used Action  LENS IOL TECNIS EYHANCE 22.5 - [id number] Intraocular Lens LENS IOL TECNIS EYHANCE 22.5 [id number] JOHNSON   Left 1 Implanted      SURGEON:  Wyonia Hough, MD   ANESTHESIA:  Topical with tetracaine drops and 2% Xylocaine jelly, augmented with 1% preservative-free intracameral lidocaine.    COMPLICATIONS:  None.   DESCRIPTION OF PROCEDURE:  The patient was identified in the holding room and transported to the operating room and placed in the supine position under the operating microscope.  The left eye was identified as the operative eye and it was prepped and draped in the usual sterile ophthalmic fashion.   A 1 millimeter clear-corneal paracentesis was made at the 1:30 position.  0.5 ml of preservative-free 1% lidocaine was injected into the anterior chamber.  The anterior chamber was filled with Viscoat viscoelastic.  A 2.4 millimeter keratome was used to make a near-clear corneal incision at the 10:30 position.  .  A curvilinear capsulorrhexis was made with a cystotome and capsulorrhexis forceps.  Balanced salt solution was used to hydrodissect and hydrodelineate the nucleus.   Phacoemulsification was then used in stop and chop fashion to remove the lens nucleus and epinucleus.  The remaining cortex was then removed using the irrigation and aspiration handpiece. Provisc was then placed into the capsular bag to distend it for lens placement.  A lens was then  injected into the capsular bag.  The remaining viscoelastic was aspirated.   Wounds were hydrated with balanced salt solution.  The anterior chamber was inflated to a physiologic pressure with balanced salt solution.  No wound leaks were noted. Cefuroxime 0.1 ml of a 10mg /ml solution was injected into the anterior chamber for a dose of 1 mg of intracameral antibiotic at the completion of the case.   Timolol and Brimonidine drops were applied to the eye.  The patient was taken to the recovery room in stable condition without complications of anesthesia or surgery.  Solan Vosler 01/09/2021, 2:27 PM

## 2021-01-09 NOTE — Anesthesia Postprocedure Evaluation (Signed)
Anesthesia Post Note  Patient: Ryan Mccall  Procedure(s) Performed: CATARACT EXTRACTION PHACO AND INTRAOCULAR LENS PLACEMENT (IOC) LEFT (Left Eye)     Patient location during evaluation: PACU Anesthesia Type: MAC Level of consciousness: awake and alert Pain management: pain level controlled Vital Signs Assessment: post-procedure vital signs reviewed and stable Respiratory status: spontaneous breathing Cardiovascular status: blood pressure returned to baseline Postop Assessment: no apparent nausea or vomiting, adequate PO intake and no headache Anesthetic complications: no   No complications documented.  Adele Barthel Shanzay Hepworth

## 2021-01-10 ENCOUNTER — Encounter: Payer: Self-pay | Admitting: Ophthalmology

## 2021-01-31 ENCOUNTER — Telehealth: Payer: Self-pay

## 2021-01-31 NOTE — Progress Notes (Signed)
Chronic Care Management Pharmacy Assistant   Name: Ryan Mccall  MRN: [id number] DOB: [birth year]  Ryan Mccall is an 75 y.o. year old male who presents for his initial CCM visit with the clinical pharmacist.   Conditions to be addressed/monitored: Atrial Fibrillation, CAD, HTN and HLD  Recent office visits:  12/17/20-[facility] pharmacist review for statins, patient has a diagnosis of cardiovascular disease and has a prescription for Simvastatin 10 mg QHS. However, patient reports taking Simvastatin 10 mg every other day (~4 times per week) and Dr. Henrene Pastor was made aware. Therefore, he has ~ 1.5 bottles of tablets remaining. This places patient into the [facility] (Statin Use In Patients with Cardiovascular Disease) measure for CMS.  Patient was seen on 12/13/20 and medication allergy list updated to include myalgia 2/2 to statin, but no diagnosis code included in encounter.   12/13/20- Dr. Henrene Pastor PCP, Afib, Labs, follow up 40mos CBC normal, platelets slightly low ?, glucose 102, kidney tests normal, liver tests slightly high, cholesterol good  07/10/20-DrHenrene Pastor PCP, Ulcer of left leg, start silver sulfADIAZINE (SILVADENE) 1 % cream, Tdap vaccine greater than or equal to 7yo IM, - Flu Vaccine QUAD High Dose(Fluad) are needed   07/03/20-Dr. Henrene Pastor PCP, Ulcer of left leg, Start cephALEXin (KEFLEX) 500 MG capsule,  guaiFENesin-codeine (CHERATUSSIN AC) 100-10 MG/5ML syrup, Tdap vaccine greater than or equal to 7yo IM, - Flu Vaccine QUAD High Dose(Fluad), are needed.   Recent consult visits:  11/20/20-Dr. Wallace Going, opthalmology, left eye cataract   Hospital visits:  01/09/21-Admission/Discharge, Dr. Wallace Going, cataract removal   Medications: Outpatient Encounter Medications as of 01/31/2021  Medication Sig  . carvedilol (COREG) 6.25 MG tablet Take 1 tablet (6.25 mg total) by mouth 2 (two) times daily.  . clotrimazole-betamethasone (LOTRISONE) cream   . COD LIVER OIL PO Take 1 capsule by mouth 3 (three)  times a week.  . diclofenac sodium (VOLTAREN) 1 % GEL Apply 1 application topically daily as needed (joint pain).  Marland Kitchen ezetimibe (ZETIA) 10 MG tablet Take 1 tablet (10 mg total) by mouth at bedtime.  . feeding supplement, ENSURE ENLIVE, (ENSURE ENLIVE) LIQD Take 237 mLs by mouth 2 (two) times daily between meals. (Patient taking differently: Take 237 mLs by mouth daily.)  . fexofenadine (ALLEGRA) 180 MG tablet Take 180 mg by mouth daily as needed for allergies.  . nitroGLYCERIN (NITROSTAT) 0.4 MG SL tablet Place 1 tablet (0.4 mg total) under the tongue as needed for chest pain. May repeat in 15 minutes up to 3 times for chest pain.  . rivaroxaban (XARELTO) 20 MG TABS tablet Take 1 tablet (20 mg total) by mouth daily with supper.  . silver sulfADIAZINE (SILVADENE) 1 % cream Apply 1 application topically daily.  . simvastatin (ZOCOR) 10 MG tablet Take 1 tablet (10 mg total) by mouth at bedtime.  Marland Kitchen spironolactone (ALDACTONE) 25 MG tablet Take 0.5 tablets (12.5 mg total) by mouth daily.   No facility-administered encounter medications on file as of 01/31/2021.     No results found for: HGBA1C, MICROALBUR   BP Readings from Last 3 Encounters:  01/09/21 124/73  12/13/20 136/88  07/10/20 120/78      . Have you seen any other providers since your last visit with PCP? Yes, had cataract surgery 01/09/21  . Any changes in your medications or health? Yes, patient stated he had a recent fall on his wrist, he stated his fingers and thumb were swollen and numb, the swelling has gone down, but still having  numbness.  I advised him to call Dr. Henrene Pastor and schedule an appointment to rule out possible fracture.  He stated he would call.   . Any side effects from any medications? No, patient is not having any side effects.   . Do you have an symptoms or problems not managed by your medications? Yes, the hand numbness from fall.   . Any concerns about your health right now? No, patient has no specific concerns    . Has your provider asked that you check blood pressure, blood sugar, or follow special diet at home? Yes, patient does check his blood pressures at home, he does not check blood sugars, he does not follow a special diet.    . Do you get any type of exercise on a regular basis? Yes, patient stays very active, he mows his yard as well as other yard work,   . Can you think of a goal you would like to reach for your health? Yes,  Patient stated he would like to drop some weight.   . Do you have any problems getting your medications? No o Patient's preferred pharmacy is:  [id number] - [place], [place] [address] Phone: [phone] Fax: [phone]  [facility]) - [place], [place] [address] Phone: [phone] Fax: [phone]   . Is there anything that you would like to discuss during the appointment? No, patient did not have anything at this time.   Star Rating Drugs:  Medication:  Last Fill: Day Supply Carvedilol   12/18/20 90 Simvastatin  02/28/20  [place], [place] Pharmacist Assistant [phone]

## 2021-02-05 NOTE — Progress Notes (Signed)
Chronic Care Management Pharmacy Note  02/08/2021 Name:  Ryan Mccall MRN:  [id number] DOB:  [birth year]   Plan Updates:   Patient reports increased tingling in his hand and fingers. He is concerned for possible neuropathy. Pharmacist encouraged patient to schedule a visit with Dr. Henrene Pastor to assess. Recommend checking a b-12 level at that time.   Patient is taking vitamin d 2000 units daily. He reports decreased energy. Please consider checking vitamin d with his next labs.   Patient is not interested in COVID vaccine at this time but will check with pharmacy about Shingrix shot. Patient reports that he had pneumonia shot in office but not listed in chart at this time.   Subjective: Ryan Mccall is an 75 y.o. year old male who is a primary patient of Henrene Pastor, Zeb Comfort, MD.  The CCM team was consulted for assistance with disease management and care coordination needs.    Engaged with patient by telephone for initial visit in response to provider referral for pharmacy case management and/or care coordination services.   Consent to Services:  The patient was given the following information about Chronic Care Management services today, agreed to services, and gave verbal consent: 1. CCM service includes personalized support from designated clinical staff supervised by the primary care provider, including individualized plan of care and coordination with other care providers 2. 24/7 contact phone numbers for assistance for urgent and routine care needs. 3. Service will only be billed when office clinical staff spend 20 minutes or more in a month to coordinate care. 4. Only one practitioner may furnish and bill the service in a calendar month. 5.The patient may stop CCM services at any time (effective at the end of the month) by phone call to the office staff. 6. The patient will be responsible for cost sharing (co-pay) of up to 20% of the service fee (after annual deductible is met). Patient  agreed to services and consent obtained.  Patient Care Team: Lillard Anes, MD as PCP - General (Family Medicine) [name], [name] M, MD as PCP - Cardiology (Cardiology) Burnice Logan, [facility] as Pharmacist (Pharmacist)  Recent office visits:  12/17/20-[facility] pharmacist review for statins, patient has a diagnosis of cardiovascular diseaseand has a prescription for Simvastatin 10 mg QHS. However, patient reports takingSimvastatin10 mg every other day (~4 times per week) and Dr. Henrene Pastor was made aware. Therefore, he has ~ 1.5 bottles of tablets remaining.This places patient into the [facility] (Statin Use In Patients with Cardiovascular Disease) measure for CMS.Patient was seen on 12/13/20 and medication allergy list updated to include myalgia 2/2 to statin, but no diagnosis code included in encounter.  12/13/20- Dr. Henrene Pastor PCP, Afib, Labs, follow up 68mos CBC normal, platelets slightly low ?, glucose 102, kidney tests normal, liver tests slightly high, cholesterol good  07/10/20-DrHenrene Pastor PCP, Ulcer of left leg, start silver sulfADIAZINE (SILVADENE) 1 % cream, Tdap vaccine greater than or equal to 7yo IM, - Flu Vaccine QUAD High Dose(Fluad) are needed   07/03/20-Dr. Henrene Pastor PCP, Ulcer of left leg, Start cephALEXin (KEFLEX) 500 MG capsule,  guaiFENesin-codeine (CHERATUSSIN AC) 100-10 MG/5ML syrup, Tdap vaccine greater than or equal to 7yo IM, - Flu Vaccine QUAD High Dose(Fluad), are needed.   Recent consult visits:  11/20/20-Dr. Wallace Going, opthalmology, left eye cataract   Hospital visits:  01/09/21-Admission/Discharge, Dr. Wallace Going, cataract removal   Objective:  Lab Results  Component Value Date   CREATININE 0.82 12/13/2020   BUN 18 12/13/2020   GFR 98.27  01/23/2014   GFRNONAA 87 06/12/2020   GFRAA 100 06/12/2020   NA 140 12/13/2020   K 4.6 12/13/2020   CALCIUM 9.2 12/13/2020   CO2 22 12/13/2020   GLUCOSE 98 12/13/2020    Lab Results  Component Value Date/Time   GFR 98.27  01/23/2014 09:42 AM   GFR 97.16 04/08/2013 09:19 AM    Last diabetic Eye exam: No results found for: HMDIABEYEEXA  Last diabetic Foot exam: No results found for: HMDIABFOOTEX   Lab Results  Component Value Date   CHOL 116 12/13/2020   HDL 39 (L) 12/13/2020   LDLCALC 61 12/13/2020   TRIG 77 12/13/2020   CHOLHDL 3.0 12/13/2020    Hepatic Function Latest Ref Rng & Units 12/13/2020 06/12/2020 11/18/2017  Total Protein 6.0 - 8.5 g/dL 6.7 7.2 6.3(L)  Albumin 3.7 - 4.7 g/dL 4.4 4.6 3.3(L)  AST 0 - 40 IU/L 59(H) 39 39  ALT 0 - 44 IU/L 56(H) 34 20  Alk Phosphatase 44 - 121 IU/L 56 63 37(L)  Total Bilirubin 0.0 - 1.2 mg/dL 0.6 0.6 1.2  Bilirubin, Direct 0.0 - 0.3 mg/dL - - -    Lab Results  Component Value Date/Time   TSH 3.48 04/08/2013 09:19 AM    CBC Latest Ref Rng & Units 12/13/2020 06/12/2020 11/21/2017  WBC 3.4 - 10.8 x10E3/uL 3.9 4.2 7.1  Hemoglobin 13.0 - 17.7 g/dL 14.5 14.9 12.1(L)  Hematocrit 37.5 - 51.0 % 43.0 44.5 35.5(L)  Platelets 150 - 450 x10E3/uL 148(L) 162 174    No results found for: VD25OH  Clinical ASCVD: Yes  The ASCVD Risk score [name], et al., 2013) failed to calculate for the following reasons:   The valid total cholesterol range is 130 to 320 mg/dL    Depression screen [place] 2/9 02/08/2021 12/21/2019  Decreased Interest 0 0  Down, Depressed, Hopeless 0 0  PHQ - 2 Score 0 0      Social History   Tobacco Use  Smoking Status Former Smoker  . Types: Cigars  . Quit date: 09/29/1997  . Years since quitting: 23.3  Smokeless Tobacco Former Systems developer  . Types: Chew  . Quit date: 1999   BP Readings from Last 3 Encounters:  01/09/21 124/73  12/13/20 136/88  07/10/20 120/78   Pulse Readings from Last 3 Encounters:  01/09/21 (!) 59  12/13/20 78  07/10/20 70   Wt Readings from Last 3 Encounters:  01/09/21 232 lb (105.2 kg)  12/13/20 238 lb (108 kg)  07/10/20 228 lb 6.4 oz (103.6 kg)   BMI Readings from Last 3 Encounters:  01/09/21 34.26 kg/m   12/13/20 35.15 kg/m  07/10/20 33.73 kg/m    Assessment/Interventions: Review of patient past medical history, allergies, medications, health status, including review of consultants reports, laboratory and other test data, was performed as part of comprehensive evaluation and provision of chronic care management services.   SDOH:  (Social Determinants of Health) assessments and interventions performed: Yes  SDOH Screenings   Alcohol Screen: Not on file  Depression (PHQ2-9): Low Risk   . PHQ-2 Score: 0  Financial Resource Strain: Not on file  Food Insecurity: No Food Insecurity  . Worried About Charity fundraiser in the Last Year: Never true  . Ran Out of Food in the Last Year: Never true  Housing: Low Risk   . Last Housing Risk Score: 0  Physical Activity: Sufficiently Active  . Days of Exercise per Week: 5 days  . Minutes of Exercise  per Session: 30 min  Social Connections: Not on file  Stress: Not on file  Tobacco Use: Medium Risk  . Smoking Tobacco Use: Former Smoker  . Smokeless Tobacco Use: Former Soil scientist Needs: No Transportation Needs  . Lack of Transportation (Medical): No  . Lack of Transportation (Non-Medical): No    CCM Care Plan  Allergies  Allergen Reactions  . Shellfish Allergy Nausea And Vomiting    Medications Reviewed Today    Reviewed by Burnice Logan, [facility] (Pharmacist) on 02/08/21 at [phone]  Med List Status: <None>  Medication Order Taking? Sig Documenting Provider Last Dose Status Informant  carvedilol (COREG) 6.25 MG tablet [id number] Yes Take 1 tablet (6.25 mg total) by mouth 2 (two) times daily. [name], [name] M, MD Taking Active   Cholecalciferol (VITAMIN D) 50 MCG (2000 UT) CAPS [id number] Yes Take 1 capsule by mouth daily. [provider] Taking Active   clotrimazole-betamethasone Donalynn Furlong) cream [id number] No   Patient not taking: Reported on 02/08/2021   [provider] Not Taking Active   COD LIVER OIL PO  [id number] Yes Take 1 capsule by mouth daily. [provider] Taking Active Self  diclofenac sodium (VOLTAREN) 1 % GEL [id number] Yes Apply 1 application topically daily as needed (joint pain). [provider] Taking Active Self  ezetimibe (ZETIA) 10 MG tablet [id number] Yes Take 1 tablet (10 mg total) by mouth at bedtime.  Patient taking differently: Take 10 mg by mouth every other day.   Lillard Anes, MD Taking Active   feeding supplement, ENSURE Sharee Pimple [place]) LIQD [id number] Yes Take 237 mLs by mouth 2 (two) times daily between meals.  Patient taking differently: Take 237 mLs by mouth daily.   Olean Ree, MD Taking Active   fexofenadine (ALLEGRA) 180 MG tablet [id number] Yes Take 180 mg by mouth daily as needed for allergies. [provider] Taking Active Self  nitroGLYCERIN (NITROSTAT) 0.4 MG SL tablet [id number] Yes Place 1 tablet (0.4 mg total) under the tongue as needed for chest pain. May repeat in 15 minutes up to 3 times for chest pain. Lillard Anes, MD Taking Active   rivaroxaban (XARELTO) 20 MG TABS tablet [id number] Yes Take 1 tablet (20 mg total) by mouth daily with supper. [name], [name] M, MD Taking Active   silver sulfADIAZINE (SILVADENE) 1 % cream [id number] Yes Apply 1 application topically daily.  Patient taking differently: Apply 1 application topically daily as needed.   Lillard Anes, MD Taking Active   simvastatin (ZOCOR) 10 MG tablet [id number] Yes Take 1 tablet (10 mg total) by mouth at bedtime.  Patient taking differently: Take 10 mg by mouth every other day.   Lillard Anes, MD Taking Active   spironolactone (ALDACTONE) 25 MG tablet [id number] Yes Take 0.5 tablets (12.5 mg total) by mouth daily. [name], [name] M, MD Taking Active   vitamin C (ASCORBIC ACID) 500 MG tablet [id number] Yes Take 500 mg by mouth 2 (two) times a week. [provider] Taking Active           Patient Active Problem List    Diagnosis Date Noted  . Myalgia 12/17/2020  . Chronic ulcer of left leg, limited to breakdown of skin ([place]) 12/21/2019  . Diverticulosis 11/17/2017  . 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 Preop examination   . Diverticulitis of colon (without mention of hemorrhage)(562.11)   . Perforation of sigmoid colon due to diverticulitis   . Diverticulitis of large intestine with perforation  without abscess 07/02/2017  . BMI 33.0-33.9,adult 10/12/2014  . Coronary artery disease   . Hypertension   . Hypercholesterolemia   . Atrial fibrillation ([place])     Immunization History  Administered Date(s) Administered  . Fluad Quad(high Dose 65+) 07/03/2020  . Influenza,inj,Quad PF,6+ Mos 07/15/2013  . Influenza-Unspecified 07/30/2018  . Tdap 07/03/2020    Conditions to be addressed/monitored:  Hypertension, Hyperlipidemia, Atrial Fibrillation and Coronary Artery Disease  Care Plan : CCM Pharmacy Care Plan  Updates made by Burnice Logan, [place] since 02/08/2021 12:00 AM    Problem: cad, htn, hld, afib   Priority: High  Onset Date: 02/08/2021    Long-Range Goal: Disease State management   Start Date: 02/08/2021  Expected End Date: 02/08/2022  This Visit's Progress: On track  Priority: High  Note:     Current Barriers:  . Unable to independently afford treatment regimen  Pharmacist Clinical Goal(s):  Marland Kitchen Patient will verbalize ability to afford treatment regimen through collaboration with PharmD and provider.   Interventions: . 1:1 collaboration with Lillard Anes, MD regarding development and update of comprehensive plan of care as evidenced by provider attestation and co-signature . Inter-disciplinary care team collaboration (see longitudinal plan of care) . Comprehensive medication review performed; medication list updated in electronic medical record  Hypertension (BP goal <130/80) -Controlled -Current treatment: . carvedilol 6.25 mg bid  . Spironolactone 25 mg 1/2 tablet daily   -Medications previously tried: none reported -Current home readings:  134/79 mmHg pulse 67, 118/74 pulse 60, 125/72 pulse, 137/85 mmHg  -Current dietary habits: eats a good breakfast, pack of crackers at lunch, supper. Drinks 1-2 ensure each day. Eggs, meat, toast for breakfast. Sandwich or cereal. Drinks water, juice, gatorade, diet dr. Malachi Bonds, tea, v8 juice.  -Current exercise habits: walks, mows yards, tries to stay active  -Denies hypotensive/hypertensive symptoms -Educated on BP goals and benefits of medications for prevention of heart attack, stroke and kidney damage; Daily salt intake goal < 2300 mg; Exercise goal of 150 minutes per week; Importance of home blood pressure monitoring; -Counseled to monitor BP at home daily, document, and provide log at future appointments -Counseled on diet and exercise extensively Recommended to continue current medication   Hyperlipidemia/CAD: (ideal LDL goal < 55) -Controlled -Current treatment: . Ezetemibe 10 mg every other day at bedtime . Nitroglycerin 0.4 mg sl prn chest pain  . Simvastatin 10 mg every other day at bedtime  -Medications previously tried: none reported  -Current dietary patterns: eggs and meat for breakfast, pack of crackers with lunch, cereal or sandwich for supper. Drinks 1-2 ensure daily.  -Current exercise habits: stays active walking outside, working in his shop and mowing -Educated on Cholesterol goals;  Benefits of statin for ASCVD risk reduction; Importance of limiting foods high in cholesterol; Exercise goal of 150 minutes per week; Strategies to manage statin-induced myalgias; -Counseled on diet and exercise extensively Recommended patient have vitamin d level checked to rule out as cause of statin intolerance. Assessed patient's cholesterol medication dosages. Patient is taking zetia and simvastatin every other day due to muscle pain. He is tolerating this well and his cholesterol is well controlled with last  labs.   Atrial Fibrillation (Goal: prevent stroke and major bleeding) -Controlled -CHADSVASC: 3 . Rate control: carvedilol 6.25 mg bid  . Anticoagulation: Xarelto 20 mg daily with supper  -Medications previously tried: none reported -Home BP and HR readings: well controlled per home readings provided (results in bp section of careplan)  -Counseled on  increased risk of stroke due to Afib and benefits of anticoagulation for stroke prevention; importance of adherence to anticoagulant exactly as prescribed; avoidance of NSAIDs due to increased bleeding risk with anticoagulants; seeking medical attention after a head injury or if there is blood in the urine/stool; -Recommended to continue current medication Assessed patient finances. Patient declines eligiblity for Xarelto. 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   Health Maintenance -Vaccine gaps: COVID - not interested at this time, Shingrix - got 1 a couple of years ago but will check with pharmacy about Shingrix, Pneumonia - patient reports having received in office -Current therapy:  . Clotrimazole-betamethasone cream as needed for rash/irritation . Silver sulfadiazine cream as needed for abrasion on leg . Cod liver oil 1 capsule three times weekly . Ensure Enlive . Vitamin C 500 mg daily  . Vitamin D 2000 units daily diclofenac 1% gel topically daily prn for joint pain  -Educated on importance of vaccines for preventive health.  -Patient is satisfied with current therapy and denies issues -Recommended patient receive Shingrix shot at the pharmacy. Encouraged patient to consider COVID vaccine and contact office or pharmacy for shot if he changes his mind.    Patient Goals/Self-Care Activities . Patient will:  - take medications as prescribed focus on medication adherence by using pill box check blood pressure daily, document, and provide at future appointments target a minimum of 150 minutes of moderate  intensity exercise weekly engage in dietary modifications by limiting fried/fatty foods and focusing on lean meats, vegetables and fruit.   Follow Up Plan: Telephone follow up appointment with care management team member scheduled for: 07/2021      Medication Assistance: None required.  Patient affirms current coverage meets needs.  Patient's preferred pharmacy is:  [id number] - [place], [place] [address] Phone: [phone] Fax: [phone]  [facility]) - [place], [place] [address] Phone: [phone] Fax: [phone]  Uses pill box? Yes Pt endorses good compliance  We discussed: Current pharmacy is preferred with insurance plan and patient is satisfied with pharmacy services Patient decided to: Continue current medication management strategy  Care Plan and Follow Up Patient Decision:  Patient agrees to Care Plan and Follow-up.  Plan: Telephone follow up appointment with care management team member scheduled for:  07/2021

## 2021-02-08 ENCOUNTER — Other Ambulatory Visit: Payer: Self-pay

## 2021-02-08 ENCOUNTER — Ambulatory Visit (INDEPENDENT_AMBULATORY_CARE_PROVIDER_SITE_OTHER): Payer: PPO

## 2021-02-08 DIAGNOSIS — M791 Myalgia, unspecified site: Secondary | ICD-10-CM

## 2021-02-08 DIAGNOSIS — I4811 Longstanding persistent atrial fibrillation: Secondary | ICD-10-CM

## 2021-02-08 DIAGNOSIS — I10 Essential (primary) hypertension: Secondary | ICD-10-CM

## 2021-02-08 DIAGNOSIS — I25708 Atherosclerosis of coronary artery bypass graft(s), unspecified, with other forms of angina pectoris: Secondary | ICD-10-CM | POA: Diagnosis not present

## 2021-02-08 NOTE — Patient Instructions (Addendum)
Visit Information  Thank you for your time discussing your medications. I look forward to working with you to achieve your health care goals. Below is a summary of what we talked about during our visit.   Goals Addressed            This Visit's Progress   . Learn More About My Health       Timeframe:  Long-Range Goal Priority:  High Start Date:                             Expected End Date:                        Follow Up Date 07/2021   - tell my story and reason for my visit - make a list of questions - ask questions - repeat what I heard to make sure I understand - bring a list of my medicines to the visit - speak up when I don't understand    Why is this important?    The best way to learn about your health and care is by talking to the doctor and nurse.   They will answer your questions and give you information in the way that you like best.    Notes:     Marland Kitchen Manage My Medicine       Timeframe:  Long-Range Goal Priority:  High Start Date:                             Expected End Date:                       Follow Up Date 07/2021    - call for medicine refill 2 or 3 days before it runs out - keep a list of all the medicines I take; vitamins and herbals too - use a pillbox to sort medicine    Why is this important?   . These steps will help you keep on track with your medicines.   Notes:     . Track and Manage My Blood Pressure-Hypertension       Timeframe:  Long-Range Goal Priority:  High Start Date:                             Expected End Date:                       Follow Up Date 07/2021   - check blood pressure daily - write blood pressure results in a log or diary    Why is this important?    You won't feel high blood pressure, but it can still hurt your blood vessels.   High blood pressure can cause heart or kidney problems. It can also cause a stroke.   Making lifestyle changes like losing a little weight or eating less salt will help.    Checking your blood pressure at home and at different times of the day can help to control blood pressure.   If the doctor prescribes medicine remember to take it the way the doctor ordered.   Call the office if you cannot afford the medicine or if there are questions about it.     Notes:        Patient Care  Plan: CCM Pharmacy Care Plan    Problem Identified: cad, htn, hld, afib   Priority: High  Onset Date: 02/08/2021    Long-Range Goal: Disease State management   Start Date: 02/08/2021  Expected End Date: 02/08/2022  This Visit's Progress: On track  Priority: High  Note:     Current Barriers:  . Unable to independently afford treatment regimen  Pharmacist Clinical Goal(s):  Marland Kitchen Patient will verbalize ability to afford treatment regimen through collaboration with PharmD and provider.   Interventions: . 1:1 collaboration with Lillard Anes, MD regarding development and update of comprehensive plan of care as evidenced by provider attestation and co-signature . Inter-disciplinary care team collaboration (see longitudinal plan of care) . Comprehensive medication review performed; medication list updated in electronic medical record  Hypertension (BP goal <130/80) -Controlled -Current treatment: . carvedilol 6.25 mg bid  . Spironolactone 25 mg 1/2 tablet daily  -Medications previously tried: none reported -Current home readings:  134/79 mmHg pulse 67, 118/74 pulse 60, 125/72 pulse, 137/85 mmHg  -Current dietary habits: eats a good breakfast, pack of crackers at lunch, supper. Drinks 1-2 ensure each day. Eggs, meat, toast for breakfast. Sandwich or cereal. Drinks water, juice, gatorade, diet dr. Malachi Bonds, tea, v8 juice.  -Current exercise habits: walks, mows yards, tries to stay active  -Denies hypotensive/hypertensive symptoms -Educated on BP goals and benefits of medications for prevention of heart attack, stroke and kidney damage; Daily salt intake goal < 2300  mg; Exercise goal of 150 minutes per week; Importance of home blood pressure monitoring; -Counseled to monitor BP at home daily, document, and provide log at future appointments -Counseled on diet and exercise extensively Recommended to continue current medication   Hyperlipidemia/CAD: (ideal LDL goal < 55) -Controlled -Current treatment: . Ezetemibe 10 mg every other day at bedtime . Nitroglycerin 0.4 mg sl prn chest pain  . Simvastatin 10 mg every other day at bedtime  -Medications previously tried: none reported  -Current dietary patterns: eggs and meat for breakfast, pack of crackers with lunch, cereal or sandwich for supper. Drinks 1-2 ensure daily.  -Current exercise habits: stays active walking outside, working in his shop and mowing -Educated on Cholesterol goals;  Benefits of statin for ASCVD risk reduction; Importance of limiting foods high in cholesterol; Exercise goal of 150 minutes per week; Strategies to manage statin-induced myalgias; -Counseled on diet and exercise extensively Recommended patient have vitamin d level checked to rule out as cause of statin intolerance. Assessed patient's cholesterol medication dosages. Patient is taking zetia and simvastatin every other day due to muscle pain. He is tolerating this well and his cholesterol is well controlled with last labs.   Atrial Fibrillation (Goal: prevent stroke and major bleeding) -Controlled -CHADSVASC: 3 . Rate control: carvedilol 6.25 mg bid  . Anticoagulation: Xarelto 20 mg daily with supper  -Medications previously tried: none reported -Home BP and HR readings: well controlled per home readings provided (results in bp section of careplan)  -Counseled on increased risk of stroke due to Afib and benefits of anticoagulation for stroke prevention; importance of adherence to anticoagulant exactly as prescribed; avoidance of NSAIDs due to increased bleeding risk with anticoagulants; seeking medical attention  after a head injury or if there is blood in the urine/stool; -Recommended to continue current medication Assessed patient finances. Patient declines eligiblity for Xarelto. 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   Health Maintenance -Vaccine gaps: COVID - not interested at this time, Shingrix - got  1 a couple of years ago but will check with pharmacy about Shingrix, Pneumonia - patient reports having received in office -Current therapy:  . Clotrimazole-betamethasone cream as needed for rash/irritation . Silver sulfadiazine cream as needed for abrasion on leg . Cod liver oil 1 capsule three times weekly . Ensure Enlive . Vitamin C 500 mg daily  . Vitamin D 2000 units daily diclofenac 1% gel topically daily prn for joint pain  -Educated on importance of vaccines for preventive health.  -Patient is satisfied with current therapy and denies issues -Recommended patient receive Shingrix shot at the pharmacy. Encouraged patient to consider COVID vaccine and contact office or pharmacy for shot if he changes his mind.    Patient Goals/Self-Care Activities . Patient will:  - take medications as prescribed focus on medication adherence by using pill box check blood pressure daily, document, and provide at future appointments target a minimum of 150 minutes of moderate intensity exercise weekly engage in dietary modifications by limiting fried/fatty foods and focusing on lean meats, vegetables and fruit.   Follow Up Plan: Telephone follow up appointment with care management team member scheduled for: 07/2021      Mr. Corsino was given information about Chronic Care Management services today including:  1. CCM service includes personalized support from designated clinical staff supervised by his physician, including individualized plan of care and coordination with other care providers 2. 24/7 contact phone numbers for assistance for urgent and routine care  needs. 3. Standard insurance, coinsurance, copays and deductibles apply for chronic care management only during months in which we provide at least 20 minutes of these services. Most insurances cover these services at 100%, however patients may be responsible for any copay, coinsurance and/or deductible if applicable. This service may help you avoid the need for more expensive face-to-face services. 4. Only one practitioner may furnish and bill the service in a calendar month. 5. The patient may stop CCM services at any time (effective at the end of the month) by phone call to the office staff.  Patient agreed to services and verbal consent obtained.   Patient verbalizes understanding of instructions provided today and agrees to view in [place].  Telephone follow up appointment with pharmacy team member scheduled for: 07/2021  Sherre Poot, PharmD Clinical Pharmacist [facility] [phone] (office) [phone] (mobile)    [email or web address]">  DASH Eating Plan DASH stands for Dietary Approaches to Stop Hypertension. The DASH eating plan is a healthy eating plan that has been shown to:  Reduce high blood pressure (hypertension).  Reduce your risk for type 2 diabetes, heart disease, and stroke.  Help with weight loss. What are tips for following this plan? Reading food labels  Check food labels for the amount of salt (sodium) per serving. Choose foods with less than 5 percent of the Daily Value of sodium. Generally, foods with less than 300 milligrams (mg) of sodium per serving fit into this eating plan.  To find whole grains, look for the word "whole" as the first word in the ingredient list. Shopping  Buy products labeled as "low-sodium" or "no salt added."  Buy fresh foods. Avoid canned foods and pre-made or frozen meals. Cooking  Avoid adding salt when cooking. Use salt-free seasonings or herbs instead of table salt  or sea salt. Check with your health care provider or pharmacist before using salt substitutes.  Do not fry foods. Cook foods using healthy methods such as baking, boiling, grilling, roasting, and broiling instead.  Lacinda Axon  with heart-healthy oils, such as olive, canola, avocado, soybean, or sunflower oil. Meal planning  Eat a balanced diet that includes: ? 4 or more servings of fruits and 4 or more servings of vegetables each day. Try to fill one-half of your plate with fruits and vegetables. ? 6-8 servings of whole grains each day. ? Less than 6 oz (170 g) of lean meat, poultry, or fish each day. A 3-oz (85-g) serving of meat is about the same size as a deck of cards. One egg equals 1 oz (28 g). ? 2-3 servings of low-fat dairy each day. One serving is 1 cup (237 mL). ? 1 serving of nuts, seeds, or beans 5 times each week. ? 2-3 servings of heart-healthy fats. Healthy fats called omega-3 fatty acids are found in foods such as walnuts, flaxseeds, fortified milks, and eggs. These fats are also found in cold-water fish, such as sardines, salmon, and mackerel.  Limit how much you eat of: ? Canned or prepackaged foods. ? Food that is high in trans fat, such as some fried foods. ? Food that is high in saturated fat, such as fatty meat. ? Desserts and other sweets, sugary drinks, and other foods with added sugar. ? Full-fat dairy products.  Do not salt foods before eating.  Do not eat more than 4 egg yolks a week.  Try to eat at least 2 vegetarian meals a week.  Eat more home-cooked food and less restaurant, buffet, and fast food.   Lifestyle  When eating at a restaurant, ask that your food be prepared with less salt or no salt, if possible.  If you drink alcohol: ? Limit how much you use to:  0-1 drink a day for women who are not pregnant.  0-2 drinks a day for men. ? Be aware of how much alcohol is in your drink. In the [place] one drink equals one 12 oz bottle of beer (355 mL), one 5 oz  glass of wine (148 mL), or one 1 oz glass of hard liquor (44 mL). General information  Avoid eating more than 2,300 mg of salt a day. If you have hypertension, you may need to reduce your sodium intake to 1,500 mg a day.  Work with your health care provider to maintain a healthy body weight or to lose weight. Ask what an ideal weight is for you.  Get at least 30 minutes of exercise that causes your heart to beat faster (aerobic exercise) most days of the week. Activities may include walking, swimming, or biking.  Work with your health care provider or dietitian to adjust your eating plan to your individual calorie needs. What foods should I eat? Fruits All fresh, dried, or frozen fruit. Canned fruit in natural juice (without added sugar). Vegetables Fresh or frozen vegetables (raw, steamed, roasted, or grilled). Low-sodium or reduced-sodium tomato and vegetable juice. Low-sodium or reduced-sodium tomato sauce and tomato paste. Low-sodium or reduced-sodium canned vegetables. Grains Whole-grain or whole-wheat bread. Whole-grain or whole-wheat pasta. Kaula Klenke rice. Modena Morrow. Bulgur. Whole-grain and low-sodium cereals. Pita bread. Low-fat, low-sodium crackers. Whole-wheat flour tortillas. Meats and other proteins Skinless chicken or [place]. Ground chicken or [place]. Pork with fat trimmed off. Fish and seafood. Egg whites. Dried beans, peas, or lentils. Unsalted nuts, nut butters, and seeds. Unsalted canned beans. Lean cuts of beef with fat trimmed off. Low-sodium, lean precooked or cured meat, such as sausages or meat loaves. Dairy Low-fat (1%) or fat-free (skim) milk. Reduced-fat, low-fat, or fat-free cheeses. Nonfat, low-sodium  ricotta or cottage cheese. Low-fat or nonfat yogurt. Low-fat, low-sodium cheese. Fats and oils Soft margarine without trans fats. Vegetable oil. Reduced-fat, low-fat, or Esther mayonnaise and salad dressings (reduced-sodium). Canola, safflower, olive, avocado, soybean,  and sunflower oils. Avocado. Seasonings and condiments Herbs. Spices. Seasoning mixes without salt. Other foods Unsalted popcorn and pretzels. Fat-free sweets. The items listed above may not be a complete list of foods and beverages you can eat. Contact a dietitian for more information. What foods should I avoid? Fruits Canned fruit in a Sandefur or heavy syrup. Fried fruit. Fruit in cream or butter sauce. Vegetables Creamed or fried vegetables. Vegetables in a cheese sauce. Regular canned vegetables (not low-sodium or reduced-sodium). Regular canned tomato sauce and paste (not low-sodium or reduced-sodium). Regular tomato and vegetable juice (not low-sodium or reduced-sodium). Angie Fava. Olives. Grains Baked goods made with fat, such as croissants, muffins, or some breads. Dry pasta or rice meal packs. Meats and other proteins Fatty cuts of meat. Ribs. Fried meat. Berniece Salines. Bologna, salami, and other precooked or cured meats, such as sausages or meat loaves. Fat from the back of a pig (fatback). Bratwurst. Salted nuts and seeds. Canned beans with added salt. Canned or smoked fish. Whole eggs or egg yolks. Chicken or [place] with skin. Dairy Whole or 2% milk, cream, and half-and-half. Whole or full-fat cream cheese. Whole-fat or sweetened yogurt. Full-fat cheese. Nondairy creamers. Whipped toppings. Processed cheese and cheese spreads. Fats and oils Butter. Stick margarine. Lard. Shortening. Ghee. Bacon fat. Tropical oils, such as coconut, palm kernel, or palm oil. Seasonings and condiments Onion salt, garlic salt, seasoned salt, table salt, and sea salt. Worcestershire sauce. Tartar sauce. Barbecue sauce. Teriyaki sauce. Soy sauce, including reduced-sodium. Steak sauce. Canned and packaged gravies. Fish sauce. Oyster sauce. Cocktail sauce. Store-bought horseradish. Ketchup. Mustard. Meat flavorings and tenderizers. Bouillon cubes. Hot sauces. Pre-made or packaged marinades. Pre-made or packaged taco  seasonings. Relishes. Regular salad dressings. Other foods Salted popcorn and pretzels. The items listed above may not be a complete list of foods and beverages you should avoid. Contact a dietitian for more information. Where to find more information  National Heart, Lung, and Blood Institute: https://wilson-eaton.com/  American Heart Association: www.heart.org  Academy of Nutrition and Dietetics: www.eatright.[place]: www.kidney.org Summary  The DASH eating plan is a healthy eating plan that has been shown to reduce high blood pressure (hypertension). It may also reduce your risk for type 2 diabetes, heart disease, and stroke.  When on the DASH eating plan, aim to eat more fresh fruits and vegetables, whole grains, lean proteins, low-fat dairy, and heart-healthy fats.  With the DASH eating plan, you should limit salt (sodium) intake to 2,300 mg a day. If you have hypertension, you may need to reduce your sodium intake to 1,500 mg a day.  Work with your health care provider or dietitian to adjust your eating plan to your individual calorie needs. This information is not intended to replace advice given to you by your health care provider. Make sure you discuss any questions you have with your health care provider. Document Revised: 08/19/2019 Document Reviewed: 08/19/2019 Elsevier Patient Education  2021 [employer].

## 2021-02-28 ENCOUNTER — Other Ambulatory Visit: Payer: Self-pay

## 2021-02-28 MED ORDER — SIMVASTATIN 10 MG PO TABS: 10.0000 mg | ORAL_TABLET | ORAL | 1 refills | Status: DC

## 2021-02-28 MED ORDER — DICLOFENAC SODIUM 1 % EX GEL: 2.0000 g | Freq: Two times a day (BID) | CUTANEOUS | 2 refills | Status: DC | PRN

## 2021-02-28 MED ORDER — CLOTRIMAZOLE-BETAMETHASONE 1-0.05 % EX CREA: TOPICAL_CREAM | Freq: Two times a day (BID) | CUTANEOUS | 2 refills | Status: DC

## 2021-02-28 MED ORDER — EZETIMIBE 10 MG PO TABS: 10.0000 mg | ORAL_TABLET | ORAL | 1 refills | Status: DC

## 2021-03-06 ENCOUNTER — Other Ambulatory Visit: Payer: Self-pay

## 2021-03-06 MED ORDER — CLOTRIMAZOLE-BETAMETHASONE 1-0.05 % EX CREA: TOPICAL_CREAM | CUTANEOUS | 2 refills | Status: DC

## 2021-03-06 MED ORDER — DICLOFENAC SODIUM 1 % EX GEL: 2.0000 g | Freq: Two times a day (BID) | CUTANEOUS | 2 refills | Status: DC | PRN

## 2021-04-22 ENCOUNTER — Other Ambulatory Visit: Payer: Self-pay

## 2021-04-22 ENCOUNTER — Other Ambulatory Visit: Payer: Self-pay | Admitting: Cardiology

## 2021-04-22 MED ORDER — RIVAROXABAN 20 MG PO TABS: 20.0000 mg | ORAL_TABLET | Freq: Every day | ORAL | 3 refills | Status: DC

## 2021-04-22 NOTE — Telephone Encounter (Signed)
Prescription refill request for Xarelto received.  Indication:atrial fib Last office visit:9/21 Weight:105.2 kg Age:75 Scr:0.8 CrCl:120.54 ml/min  Prescription refilled

## 2021-04-22 NOTE — Telephone Encounter (Signed)
*  STAT* If patient is at the pharmacy, call can be transferred to refill team.   1. Which medications need to be refilled? (please list name of each medication and dose if known)   rivaroxaban (XARELTO) 20 MG TABS tablet [id number] 2. Which pharmacy/location (including street and city if local pharmacy) is medication to be sent to? [facility]) - [place], [place] Phone:  [phone]  Fax:  [phone]       3. Do they need a 30 day or 90 day supply? [place]

## 2021-05-10 ENCOUNTER — Telehealth: Payer: Self-pay

## 2021-05-10 NOTE — Chronic Care Management (AMB) (Signed)
Chronic Care Management Pharmacy Assistant   Name: Ryan Mccall  MRN: [id number] DOB: [birth year]  Reason for Encounter: Hypertension Disease State Call  Recent office visits:  No visits noted  Recent consult visits:  No visits noted  Hospital visits:  None in previous 6 months  Medications: Outpatient Encounter Medications as of 05/10/2021  Medication Sig   carvedilol (COREG) 6.25 MG tablet Take 1 tablet (6.25 mg total) by mouth 2 (two) times daily.   Cholecalciferol (VITAMIN D) 50 MCG (2000 UT) CAPS Take 1 capsule by mouth daily.   clotrimazole-betamethasone (LOTRISONE) cream Apply 2 gr in the affected area twice a day.   COD LIVER OIL PO Take 1 capsule by mouth daily.   diclofenac Sodium (VOLTAREN) 1 % GEL Apply 2 g topically 2 (two) times daily as needed.   ezetimibe (ZETIA) 10 MG tablet Take 1 tablet (10 mg total) by mouth every other day.   feeding supplement, ENSURE ENLIVE, (ENSURE ENLIVE) LIQD Take 237 mLs by mouth 2 (two) times daily between meals. (Patient taking differently: Take 237 mLs by mouth daily.)   fexofenadine (ALLEGRA) 180 MG tablet Take 180 mg by mouth daily as needed for allergies.   nitroGLYCERIN (NITROSTAT) 0.4 MG SL tablet Place 1 tablet (0.4 mg total) under the tongue as needed for chest pain. May repeat in 15 minutes up to 3 times for chest pain.   rivaroxaban (XARELTO) 20 MG TABS tablet Take 1 tablet (20 mg total) by mouth daily with supper.   silver sulfADIAZINE (SILVADENE) 1 % cream Apply 1 application topically daily. (Patient taking differently: Apply 1 application topically daily as needed.)   simvastatin (ZOCOR) 10 MG tablet Take 1 tablet (10 mg total) by mouth every other day.   spironolactone (ALDACTONE) 25 MG tablet Take 0.5 tablets (12.5 mg total) by mouth daily.   vitamin C (ASCORBIC ACID) 500 MG tablet Take 500 mg by mouth 2 (two) times a week.   No facility-administered encounter medications on file as of 05/10/2021.    Recent Office  Vitals: BP Readings from Last 3 Encounters:  01/09/21 124/73  12/13/20 136/88  07/10/20 120/78   Pulse Readings from Last 3 Encounters:  01/09/21 (!) 59  12/13/20 78  07/10/20 70    Wt Readings from Last 3 Encounters:  01/09/21 232 lb (105.2 kg)  12/13/20 238 lb (108 kg)  07/10/20 228 lb 6.4 oz (103.6 kg)     Kidney Function Lab Results  Component Value Date/Time   CREATININE 0.82 12/13/2020 07:56 AM   CREATININE 0.84 06/12/2020 08:04 AM   GFR 98.27 01/23/2014 09:42 AM   GFRNONAA 87 06/12/2020 08:04 AM   GFRAA 100 06/12/2020 08:04 AM    BMP Latest Ref Rng & Units 12/13/2020 06/12/2020 11/25/2017  Glucose 65 - 99 mg/dL 98 102(H) -  BUN 8 - 27 mg/dL 18 19 -  Creatinine 0.76 - 1.27 mg/dL 0.82 0.84 -  BUN/Creat Ratio 10 - '24 22 23 '$ -  Sodium 134 - 144 mmol/L 140 141 -  Potassium 3.5 - 5.2 mmol/L 4.6 4.9 3.5  Chloride 96 - 106 mmol/L 104 103 -  CO2 20 - 29 mmol/L 22 24 -  Calcium 8.6 - 10.2 mg/dL 9.2 9.5 -     Current antihypertensive regimen:  carvedilol 6.25 mg bid  Spironolactone 25 mg 1/2 tablet daily   Patient verbally confirms he is taking the above medications as directed. Yes  How often are you checking your Blood Pressure?  Patient stated  he checks his BP twice daily  he checks his blood pressure  twice daily  before taking his medication.  Current home BP readings: 128/79,125/77,130/81,118/79,120/77,123/77,113/67,125/74,119/76  Wrist or arm cuff: Arm Caffeine intake: 1 cup daily Salt intake: Moderate OTC medications including pseudoephedrine or NSAIDs? Ibuprofen infrequently  Any readings above 180/120? No  What recent interventions/DTPs have been made by any provider to improve Blood Pressure control since last CPP Visit:  No recent interventions  Any recent hospitalizations or ED visits since last visit with CPP? No recent hospitalizations  What diet changes have been made to improve Blood Pressure Control?  Patient stated he maintains the same  diet without restrictions. He eats eggs, tomatoes, biscuits, and BEC sandwiches. He also drinks coffee and arnold palmers  What exercise is being done to improve your Blood Pressure Control?  Patient stated he uses a push mower daily to mow various lawns  Adherence Review: Is the patient currently on ACE/ARB medication? No Does the patient have >5 day gap between last estimated fill dates? No carvedilol 6.25 mg - 90 DS last filled 02/26/21 Spironolactone 25 mg - 90 DS last filled 02/26/21  Care Gaps: Last annual wellness visit? None noted  Star Rating Drugs:  Medication:  Last Fill: Day Supply Simvastatin 10 mg 03/05/21 90 DS  Wil[place] Sports CPA, CMA

## 2021-05-28 ENCOUNTER — Other Ambulatory Visit: Payer: Self-pay

## 2021-05-28 MED ORDER — CARVEDILOL 6.25 MG PO TABS: 6.2500 mg | ORAL_TABLET | Freq: Two times a day (BID) | ORAL | 3 refills | Status: DC

## 2021-05-28 MED ORDER — SPIRONOLACTONE 25 MG PO TABS: 12.5000 mg | ORAL_TABLET | Freq: Every day | ORAL | 3 refills | Status: DC

## 2021-06-14 ENCOUNTER — Encounter: Payer: Self-pay | Admitting: Legal Medicine

## 2021-06-14 ENCOUNTER — Ambulatory Visit (INDEPENDENT_AMBULATORY_CARE_PROVIDER_SITE_OTHER): Payer: PPO | Admitting: Legal Medicine

## 2021-06-14 ENCOUNTER — Other Ambulatory Visit: Payer: Self-pay

## 2021-06-14 VITALS — BP 130/86 | HR 68 | Temp 97.6°F | Ht 69.0 in | Wt 230.8 lb

## 2021-06-14 DIAGNOSIS — I4811 Longstanding persistent atrial fibrillation: Secondary | ICD-10-CM | POA: Diagnosis not present

## 2021-06-14 DIAGNOSIS — Z6833 Body mass index (BMI) 33.0-33.9, adult: Secondary | ICD-10-CM

## 2021-06-14 DIAGNOSIS — I10 Essential (primary) hypertension: Secondary | ICD-10-CM | POA: Diagnosis not present

## 2021-06-14 DIAGNOSIS — E7800 Pure hypercholesterolemia, unspecified: Secondary | ICD-10-CM | POA: Diagnosis not present

## 2021-06-14 DIAGNOSIS — I25708 Atherosclerosis of coronary artery bypass graft(s), unspecified, with other forms of angina pectoris: Secondary | ICD-10-CM

## 2021-06-14 DIAGNOSIS — D6869 Other thrombophilia: Secondary | ICD-10-CM | POA: Diagnosis not present

## 2021-06-14 MED ORDER — NITROGLYCERIN 0.4 MG SL SUBL: 0.4000 mg | SUBLINGUAL_TABLET | SUBLINGUAL | 2 refills | Status: DC | PRN

## 2021-06-14 NOTE — Progress Notes (Signed)
Established Patient Office Visit  Subjective:  Patient ID: Ryan Mccall, male    DOB: 1946/09/01  Age: 75 y.o. MRN: [id number]  CC:  Chief Complaint  Patient presents with   Hypertension    HPI Ryan Mccall presents for chronic visit  Patient presents for follow up of hypertension.  Patient tolerating carvedilol,  well with side effects.  Patient was diagnosed with hypertension 2010 so has been treated for hypertension for 10 years.Patient is working on maintaining diet and exercise regimen and follows up as directed. Complication include none.   Patient has a diagnosis of permanent atrial fibrillation.   Patient is on xarelto and has controled ventricular response.  Patient is CV stable.   Patient presents with hyperlipidemia.  Compliance with treatment has been good; patient takes medicines as directed, maintains low cholesterol diet, follows up as directed, and maintains exercise regimen.  Patient is using simvastatin without problems.   Past Medical History:  Diagnosis Date   Atrial fibrillation [facility])    Coronary artery disease    s/p remote CABG 1999 per Dr. 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6 6.4%   COLECTOMY WITH  COLOSTOMY CREATION/HARTMANN PROCEDURE N/A 07/03/2017   Procedure: COLECTOMY WITH COLOSTOMY CREATION/HARTMANN PROCEDURE;  Surgeon: Florene Glen, MD;  Location: [facility];  Service: General;  Laterality: N/A;   COLONOSCOPY WITH PROPOFOL N/A 10/27/2017   Procedure: COLONOSCOPY WITH PROPOFOL through colostomy;  Surgeon: Lucilla Lame, MD;  Location: [facility];  Service: Endoscopy;  Laterality: N/A;   COLOSTOMY CLOSURE N/A 11/17/2017   Procedure: COLOSTOMY CLOSURE;  Surgeon: Florene Glen, MD;  Location: [facility];  Service: General;  Laterality: N/A;   CORONARY ARTERY BYPASS GRAFT  1999   4 vessels   excision of skin cancer     KNEE ARTHROSCOPY W/ MENISCAL REPAIR      Family History  Problem Relation Age of Onset   Lung disease Father 77    Social History   Socioeconomic History   Marital status: Married    Spouse name: Not on file   Number of children: 2   Years of education: Not on file   Highest education level: Not on file  Occupational History   Occupation: Print production planner  Tobacco Use   Smoking status: Former    Types: Cigars    Quit date: 09/29/1997    Years since quitting: 23.7   Smokeless tobacco: Former    Types: Chew    Quit date: 1999  Vaping Use   Vaping Use: Never used  Substance and Sexual Activity   Alcohol use: Yes    Alcohol/week: 1.0 standard drink    Types: 1 Shots of liquor per week    Comment: one time monthly  Drug use: No   Sexual activity: Yes    Partners: Female  Other Topics Concern   Not on file  Social History Narrative   Not on file   Social Determinants of Health   Financial Resource Strain: Not on file  Food Insecurity: No Food Insecurity   Worried About Running Out of Food in the Last Year: Never true   Ran Out of Food in the Last Year: Never true  Transportation Needs: No Transportation Needs   Lack of Transportation (Medical): No   Lack of Transportation (Non-Medical): No  Physical Activity: Sufficiently  Active   Days of Exercise per Week: 5 days   Minutes of Exercise per Session: 30 min  Stress: Not on file  Social Connections: Not on file  Intimate Partner Violence: Not on file    Outpatient Medications Prior to Visit  Medication Sig Dispense Refill   carvedilol (COREG) 6.25 MG tablet Take 1 tablet (6.25 mg total) by mouth 2 (two) times daily. 180 tablet 3   Cholecalciferol (VITAMIN D) 50 MCG (2000 UT) CAPS Take 1 capsule by mouth daily.     clotrimazole-betamethasone (LOTRISONE) cream Apply 2 gr in the affected area twice a day. 45 g 2   COD LIVER OIL PO Take 1 capsule by mouth daily.     diclofenac Sodium (VOLTAREN) 1 % GEL Apply 2 g topically 2 (two) times daily as needed. 1000 g 2   ezetimibe (ZETIA) 10 MG tablet Take 1 tablet (10 mg total) by mouth every other day. 90 tablet 1   feeding supplement, ENSURE ENLIVE, (ENSURE ENLIVE) LIQD Take 237 mLs by mouth 2 (two) times daily between meals. (Patient taking differently: Take 237 mLs by mouth daily.) 237 mL 12   fexofenadine (ALLEGRA) 180 MG tablet Take 180 mg by mouth daily as needed for allergies.     rivaroxaban (XARELTO) 20 MG TABS tablet Take 1 tablet (20 mg total) by mouth daily with supper. 90 tablet 3   silver sulfADIAZINE (SILVADENE) 1 % cream Apply 1 application topically daily. (Patient taking differently: Apply 1 application topically daily as needed.) 50 g 2   simvastatin (ZOCOR) 10 MG tablet Take 1 tablet (10 mg total) by mouth every other day. 90 tablet 1   spironolactone (ALDACTONE) 25 MG tablet Take 0.5 tablets (12.5 mg total) by mouth daily. 45 tablet 3   vitamin C (ASCORBIC ACID) 500 MG tablet Take 500 mg by mouth 2 (two) times a week.     nitroGLYCERIN (NITROSTAT) 0.4 MG SL tablet Place 1 tablet (0.4 mg total) under the tongue as needed for chest pain. May repeat in 15 minutes up to 3 times for chest pain. 30 tablet 2   No facility-administered medications prior to visit.    Allergies  Allergen Reactions    Shellfish Allergy Nausea And Vomiting    ROS Review of Systems  Constitutional:  Negative for chills, fatigue and fever.  HENT:  Negative for congestion, ear pain and sore throat.   Respiratory:  Negative for cough and shortness of breath.   Cardiovascular:  Negative for chest pain.  Gastrointestinal:  Negative for abdominal pain, constipation, diarrhea, nausea and vomiting.  Endocrine: Negative for polydipsia, polyphagia and polyuria.  Genitourinary:  Negative for dysuria and frequency.  Musculoskeletal:  Negative for arthralgias and myalgias.  Neurological:  Negative for dizziness and headaches.  Psychiatric/Behavioral:  Negative for dysphoric mood.        No dysphoria     Objective:  Physical Exam Vitals reviewed.  Constitutional:      General: He is not in acute distress.    Appearance: Normal appearance. He is obese.  HENT:     Head: Normocephalic.     Right Ear: Tympanic membrane, ear canal and external ear normal.     Left Ear: Tympanic membrane, ear canal and external ear normal.     Mouth/Throat:     Mouth: Mucous membranes are moist.     Pharynx: Oropharynx is clear.  Eyes:     Extraocular Movements: Extraocular movements intact.     Conjunctiva/sclera: Conjunctivae normal.     Pupils: Pupils are equal, round, and reactive to [place].  Cardiovascular:     Rate and Rhythm: Normal rate and regular rhythm.     Pulses: Normal pulses.     Heart sounds: Normal heart sounds.  Pulmonary:     Effort: Pulmonary effort is normal. No respiratory distress.     Breath sounds: Normal breath sounds. No wheezing.  Abdominal:     General: Abdomen is flat. Bowel sounds are normal. There is no distension.     Palpations: Abdomen is soft.     Tenderness: There is no abdominal tenderness.  Musculoskeletal:        General: Normal range of motion.     Cervical back: Normal range of motion.     Right lower leg: No edema.     Left lower leg: No edema.  Skin:    General: Skin is  warm.     Capillary Refill: Capillary refill takes less than 2 seconds.  Neurological:     General: No focal deficit present.     Mental Status: He is alert and oriented to person, place, and time.  Psychiatric:        Mood and Affect: Mood normal.        Behavior: Behavior normal.    BP 130/86   Pulse 68   Temp 97.6 F (36.4 C)   Ht $R'5\' 9"'kV$  (1.753 m)   Wt 230 lb 12.8 oz (104.7 kg)   SpO2 98%   BMI 34.08 kg/m  Wt Readings from Last 3 Encounters:  06/14/21 230 lb 12.8 oz (104.7 kg)  01/09/21 232 lb (105.2 kg)  12/13/20 238 lb (108 kg)     Health Maintenance Due  Topic Date Due   COVID-19 Vaccine (1) Never done   Hepatitis C Screening  Never done   Zoster Vaccines- Shingrix (1 of 2) Never done   INFLUENZA VACCINE  04/29/2021    There are no preventive care reminders to display for this patient.  Lab Results  Component Value Date   TSH 3.48 04/08/2013   Lab Results  Component Value Date   WBC 4.2 06/14/2021   HGB 14.2 06/14/2021   HCT 43.1 06/14/2021   MCV 91 06/14/2021   PLT 140 (L) 06/14/2021   Lab Results  Component Value Date   NA 141 06/14/2021   K 4.9 06/14/2021   CO2 23 06/14/2021   GLUCOSE 99 06/14/2021   BUN 27 06/14/2021   CREATININE 0.88 06/14/2021   BILITOT 0.7 06/14/2021   ALKPHOS 56 06/14/2021   AST 38 06/14/2021   ALT 32 06/14/2021   PROT 6.7 06/14/2021   ALBUMIN 4.4 06/14/2021   CALCIUM 9.5 06/14/2021   ANIONGAP 6 11/23/2017   EGFR 90 06/14/2021   GFR 98.27 01/23/2014   Lab Results  Component Value Date   CHOL 113 06/14/2021   Lab Results  Component  Value Date   HDL 41 06/14/2021   Lab Results  Component Value Date   LDLCALC 57 06/14/2021   Lab Results  Component Value Date   TRIG 70 06/14/2021   Lab Results  Component Value Date   CHOLHDL 2.8 06/14/2021   No results found for: HGBA1C    Assessment & Plan:  Diagnoses and all orders for this visit: Primary hypertension -     CBC with Differential -      Comprehensive metabolic panel An individual hypertension care plan was established and reinforced today.  The patient's status was assessed using clinical findings on exam and labs or diagnostic tests. The patient's success at meeting treatment goals on disease specific evidence-based guidelines and found to be well controlled. SELF MANAGEMENT: The patient and I together assessed ways to personally work towards obtaining the recommended goals. RECOMMENDATIONS: avoid decongestants found in common cold remedies, decrease consumption of alcohol, perform routine monitoring of BP with home BP cuff, exercise, reduction of dietary salt, take medicines as prescribed, try not to miss doses and quit smoking.  Regular exercise and maintaining a healthy weight is needed.  Stress reduction may help. A CLINICAL SUMMARY including written plan identify barriers to care unique to individual due to social or financial issues.  We attempt to mutually creat solutions for individual and family understanding.   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 May repeat in 15 minutes up to 3 times for chest pain. An individual plan was formulated based on patient history and exam, labs and evidence based data. Patient has not had recent angina or nitroglycerin use. continue present treatment.   Hypercholesterolemia -     Lipid panel AN INDIVIDUAL CARE PLAN for hyperlipidemia/ cholesterol was established and reinforced today.  The patient's status was assessed using clinical findings on exam, lab and other diagnostic tests. The patient's disease status was assessed based on evidence-based guidelines and found to be fair controlled. MEDICATIONS were reviewed. SELF MANAGEMENT GOALS have been discussed and patient's success at attaining the  goal of low cholesterol was assessed. RECOMMENDATION given include regular exercise 3 days a week and low cholesterol/low fat diet. CLINICAL SUMMARY including written plan to identify barriers unique to the patient due to social or economic  reasons was discussed.   Acquired thrombophilia ([place]) Patient is on chronic anticoagulation due to atrial fibrillation  Longstanding persistent atrial fibrillation [facility]) Patient has a diagnosis of permanent atrial fibrillation.   Patient is on eliquis and has controlled ventricular response.  Patient is CV stable.   BMI 33.0-33.9,adult An individualize plan was formulated for obesity using patient history and physical exam to encourage weight loss.  An evidence based program was formulated.  Patient is to cut portion size with meals and to plan physical exercise 3 days a week at least 20 minutes.  Weight watchers and other programs are helpful.  Planned amount of weight loss 10 lbs.  Other orders -     Cardiovascular Risk Assessment    Lipid panel (Completed)    Meds ordered this encounter  Medications   nitroGLYCERIN (NITROSTAT) 0.4 MG SL tablet    Sig: Place 1 tablet (0.4 mg total) under the tongue as needed for chest pain. May repeat in 15 minutes up to 3 times for chest pain.  Dispense:  30 tablet    Refill:  2    Follow-up: Return in about 6 months (around 12/12/2021).    [name], MD

## 2021-06-15 LAB — CBC WITH DIFFERENTIAL/PLATELET
Basophils Absolute: 0 10*3/uL (ref 0.0–0.2)
Basos: 1 %
EOS (ABSOLUTE): 0.1 10*3/uL (ref 0.0–0.4)
Eos: 3 %
Hematocrit: 43.1 % (ref 37.5–51.0)
Hemoglobin: 14.2 g/dL (ref 13.0–17.7)
Immature Grans (Abs): 0 10*3/uL (ref 0.0–0.1)
Immature Granulocytes: 0 %
Lymphocytes Absolute: 1.5 10*3/uL (ref 0.7–3.1)
Lymphs: 37 %
MCH: 29.9 pg (ref 26.6–33.0)
MCHC: 32.9 g/dL (ref 31.5–35.7)
MCV: 91 fL (ref 79–97)
Monocytes Absolute: 0.5 10*3/uL (ref 0.1–0.9)
Monocytes: 12 %
Neutrophils Absolute: 2 10*3/uL (ref 1.4–7.0)
Neutrophils: 47 %
Platelets: 140 10*3/uL — ABNORMAL LOW (ref 150–450)
RBC: 4.75 x10E6/uL (ref 4.14–5.80)
RDW: 13.1 % (ref 11.6–15.4)
WBC: 4.2 10*3/uL (ref 3.4–10.8)

## 2021-06-15 LAB — COMPREHENSIVE METABOLIC PANEL
ALT: 32 IU/L (ref 0–44)
AST: 38 IU/L (ref 0–40)
Albumin/Globulin Ratio: 1.9 (ref 1.2–2.2)
Albumin: 4.4 g/dL (ref 3.7–4.7)
Alkaline Phosphatase: 56 IU/L (ref 44–121)
BUN/Creatinine Ratio: 31 — ABNORMAL HIGH (ref 10–24)
BUN: 27 mg/dL (ref 8–27)
Bilirubin Total: 0.7 mg/dL (ref 0.0–1.2)
CO2: 23 mmol/L (ref 20–29)
Calcium: 9.5 mg/dL (ref 8.6–10.2)
Chloride: 104 mmol/L (ref 96–106)
Creatinine, Ser: 0.88 mg/dL (ref 0.76–1.27)
Globulin, Total: 2.3 g/dL (ref 1.5–4.5)
Glucose: 99 mg/dL (ref 65–99)
Potassium: 4.9 mmol/L (ref 3.5–5.2)
Sodium: 141 mmol/L (ref 134–144)
Total Protein: 6.7 g/dL (ref 6.0–8.5)
eGFR: 90 mL/min/{1.73_m2} (ref >59)

## 2021-06-15 LAB — CARDIOVASCULAR RISK ASSESSMENT

## 2021-06-15 LAB — LIPID PANEL
Chol/HDL Ratio: 2.8 ratio (ref 0.0–5.0)
Cholesterol, Total: 113 mg/dL (ref 100–199)
HDL: 41 mg/dL (ref >39)
LDL Chol Calc (NIH): 57 mg/dL (ref 0–99)
Triglycerides: 70 mg/dL (ref 0–149)
VLDL Cholesterol Cal: 15 mg/dL (ref 5–40)

## 2021-06-16 NOTE — Progress Notes (Signed)
Platelets low 140, chronic, kidney tests normal, liver tests normal, cholesterol normal,  lp

## 2021-06-21 NOTE — Progress Notes (Signed)
Ryan Mccall Date of Birth: 09/15/1946 Medical Record #[id number]  History of Present Illness: Ryan Mccall is seen back today for follow up of CAD. He has known CAD, HTN and HLD. He had remote CABG per Dr. Cyndia Bent in 1999 with LIMA to LAD, SVG to 1st DX and SVG to intermediate and OM of the LCX. He did have transient atrial fibrillation during a stress test in 2009. His last stress Myoview in July 2017showed normal perfusion. He was diagnosed with persistent atrial fibrillation in July 2014. Treated with rate control and anticoagulation.  Echocardiogram showed normal LV function with mild mitral insufficiency and moderate left atrial enlargement.  Previously Irbesartan was  discontinued due to low BP.   He did undergo cataract surgery in April    On follow up today he is doing well. He brings extensive BP and HR recordings which have been excellent.  He notes he only gets chest tightness when he is mowing in the hot weather. He just slows down. He is active walking. He denies any other chest pain, dyspnea, palpitations, no edema.   Current Outpatient Medications on File Prior to Visit  Medication Sig Dispense Refill   carvedilol (COREG) 6.25 MG tablet Take 1 tablet (6.25 mg total) by mouth 2 (two) times daily. 180 tablet 3   Cholecalciferol (VITAMIN D) 50 MCG (2000 UT) CAPS Take 1 capsule by mouth daily.     clotrimazole-betamethasone (LOTRISONE) cream Apply 2 gr in the affected area twice a day. 45 g 2   COD LIVER OIL PO Take 1 capsule by mouth daily.     diclofenac Sodium (VOLTAREN) 1 % GEL Apply 2 g topically 2 (two) times daily as needed. 1000 g 2   ezetimibe (ZETIA) 10 MG tablet Take 1 tablet (10 mg total) by mouth every other day. 90 tablet 1   feeding supplement, ENSURE ENLIVE, (ENSURE ENLIVE) LIQD Take 237 mLs by mouth 2 (two) times daily between meals. (Patient taking differently: Take 237 mLs by mouth daily.) 237 mL 12   fexofenadine (ALLEGRA) 180 MG tablet Take 180 mg by mouth daily  as needed for allergies.     nitroGLYCERIN (NITROSTAT) 0.4 MG SL tablet Place 1 tablet (0.4 mg total) under the tongue as needed for chest pain. May repeat in 15 minutes up to 3 times for chest pain. 30 tablet 2   rivaroxaban (XARELTO) 20 MG TABS tablet Take 1 tablet (20 mg total) by mouth daily with supper. 90 tablet 3   silver sulfADIAZINE (SILVADENE) 1 % cream Apply 1 application topically daily. (Patient taking differently: Apply 1 application topically daily as needed.) 50 g 2   simvastatin (ZOCOR) 10 MG tablet Take 1 tablet (10 mg total) by mouth every other day. 90 tablet 1   spironolactone (ALDACTONE) 25 MG tablet Take 0.5 tablets (12.5 mg total) by mouth daily. 45 tablet 3   vitamin C (ASCORBIC ACID) 500 MG tablet Take 500 mg by mouth 2 (two) times a week.     No current facility-administered medications on file prior to visit.    Allergies  Allergen Reactions   Shellfish Allergy Nausea And Vomiting    Past Medical History:  Diagnosis Date   Atrial fibrillation ([place])    Coronary artery disease    s/p remote CABG 1999 per Dr. Cyndia Bent;  Lexiscan Myoview (01/2014):  No ischemia, not gated, normal study   COVID-19 03/2019   Hypercholesterolemia    Hypertension    Skin cancer of trunk  abdominal wall squamous cell   Wears dentures    full upper and lower    Past Surgical History:  Procedure Laterality Date   CATARACT EXTRACTION W/PHACO Right 01/19/2020   Procedure: CATARACT EXTRACTION PHACO AND INTRAOCULAR LENS PLACEMENT ([place]) RIGHT VISION BLUE 8.60  00:58.6;  Surgeon: Marchia Meiers, MD;  Location: [place];  Service: Ophthalmology;  Laterality: Right;   CATARACT EXTRACTION W/PHACO Left 01/09/2021   Procedure: CATARACT EXTRACTION PHACO AND INTRAOCULAR LENS PLACEMENT ([place]) LEFT;  Surgeon: Leandrew Koyanagi, MD;  Location: [place];  Service: Ophthalmology;  Laterality: Left;  3.30 0:51.6 6.4%   COLECTOMY WITH COLOSTOMY CREATION/HARTMANN PROCEDURE N/A  07/03/2017   Procedure: COLECTOMY WITH COLOSTOMY CREATION/HARTMANN PROCEDURE;  Surgeon: Florene Glen, MD;  Location: [facility];  Service: General;  Laterality: N/A;   COLONOSCOPY WITH PROPOFOL N/A 10/27/2017   Procedure: COLONOSCOPY WITH PROPOFOL through colostomy;  Surgeon: Lucilla Lame, MD;  Location: [facility];  Service: Endoscopy;  Laterality: N/A;   COLOSTOMY CLOSURE N/A 11/17/2017   Procedure: COLOSTOMY CLOSURE;  Surgeon: Florene Glen, MD;  Location: [facility];  Service: General;  Laterality: N/A;   CORONARY ARTERY BYPASS GRAFT  1999   4 vessels   excision of skin cancer     KNEE ARTHROSCOPY W/ MENISCAL REPAIR      Social History   Tobacco Use  Smoking Status Former   Types: Cigars   Quit date: 09/29/1997   Years since quitting: 23.7  Smokeless Tobacco Former   Types: Chew   Quit date: 1999    Social History   Substance and Sexual Activity  Alcohol Use Yes   Alcohol/week: 1.0 standard drink   Types: 1 Shots of liquor per week   Comment: one time monthly    Family History  Problem Relation Age of Onset   Lung disease Father 80    Review of Systems: The review of systems is per the HPI.  All other systems were reviewed and are negative.  Physical Exam: BP (!) 158/92 (BP Location: Right Arm, Patient Position: Sitting, Cuff Size: Normal)   Pulse 66   Ht 5\' 9"  (1.753 m)   Wt 229 lb 12.8 oz (104.2 kg)   SpO2 97%   BMI 33.94 kg/m   GENERAL:  Well appearing WM in NAD HEENT:  PERRL, EOMI, sclera are clear. Oropharynx is clear. NECK:  No jugular venous distention, carotid upstroke brisk and symmetric, no bruits, no thyromegaly or adenopathy LUNGS:  Clear to auscultation bilaterally CHEST:  Unremarkable HEART:  IRRR,  PMI not displaced or sustained,S1 and S2 within normal limits, no S3, no S4: no clicks, no rubs, no murmurs ABD:  Soft, nontender. BS +, no masses or bruits. No hepatomegaly, no splenomegaly EXT:  2 + pulses throughout, no edema, no cyanosis  no clubbing SKIN:  Warm and dry.  No rashes NEURO:  Alert and oriented x 3. Cranial nerves II through XII intact. PSYCH:  Cognitively intact   LABORATORY DATA:   Lab Results  Component Value Date   WBC 4.2 06/14/2021   HGB 14.2 06/14/2021   HCT 43.1 06/14/2021   PLT 140 (L) 06/14/2021   GLUCOSE 99 06/14/2021   CHOL 113 06/14/2021   TRIG 70 06/14/2021   HDL 41 06/14/2021   LDLCALC 57 06/14/2021   ALT 32 06/14/2021   AST 38 06/14/2021   NA 141 06/14/2021   K 4.9 06/14/2021   CL 104 06/14/2021   CREATININE 0.88 06/14/2021   BUN 27 06/14/2021  CO2 23 06/14/2021   TSH 3.48 04/08/2013   INR 1.99 07/01/2017   Labs from 02/18/17:  CBC with platelet count 130K. Marland Kitchen Cholesterol 127, triglycerides 79, LDL 73, HDL 38. A1c 5.6%. CMET normal. Dated 10/28/17: cholesterol 126, triglycerides 91, HDL 38, LDL 66. A1c 5.3%. CBC and CMET normal. Dated 06/02/18: cholesterol 122, triglycerides 64, HDL 45, LDL 64. A1c 5.3%. CBC and CMET normal Dated 10/04/19: A1c 5.4%. cholesterol 116, triglycerides 102, HDL 41, LDL 71. Sodium 132, otherwise CBC and CMET normal.  Ecg today shows Afib with rate 66. RAD. Otherwise normal. I have personally reviewed and interpreted this study.   Myoview 04/11/16: Study Highlights   Nuclear stress EF: 54%. The LV systolic function is normal There was no ST segment deviation noted during stress. The study is normal. no ischemia. No infarction This is a low risk study.       Assessment / Plan: 1. CAD - remote CABG in 1999 - normal Myoview in July 2017. Patient has class 1 symptoms- stable angina. Continue risk factor modification. Continue Coreg.    2. Atrial fibrillation - permanent. Rate control is excellent on Coreg. He is asymptomatic. Continue anticoagulation with Xarelto. No bleeding.  3. HTN -  Well  Controlled on Coreg and spironolactone per patient records and readings with Dr Henrene Pastor.  4. HLD - on statin. Well controlled. At goal.   5. Obesity.encouraged  weight loss    Plan I will followup again in 12 months.

## 2021-06-24 ENCOUNTER — Ambulatory Visit: Payer: PPO | Admitting: Cardiology

## 2021-06-24 ENCOUNTER — Encounter: Payer: Self-pay | Admitting: Cardiology

## 2021-06-24 ENCOUNTER — Other Ambulatory Visit: Payer: Self-pay

## 2021-06-24 VITALS — BP 158/92 | HR 66 | Ht 69.0 in | Wt 229.8 lb

## 2021-06-24 DIAGNOSIS — Z7901 Long term (current) use of anticoagulants: Secondary | ICD-10-CM | POA: Diagnosis not present

## 2021-06-24 DIAGNOSIS — E7800 Pure hypercholesterolemia, unspecified: Secondary | ICD-10-CM

## 2021-06-24 DIAGNOSIS — I10 Essential (primary) hypertension: Secondary | ICD-10-CM | POA: Diagnosis not present

## 2021-06-24 DIAGNOSIS — I482 Chronic atrial fibrillation, unspecified: Secondary | ICD-10-CM

## 2021-06-24 DIAGNOSIS — I25708 Atherosclerosis of coronary artery bypass graft(s), unspecified, with other forms of angina pectoris: Secondary | ICD-10-CM

## 2021-08-12 ENCOUNTER — Telehealth: Payer: Self-pay

## 2021-08-12 NOTE — Chronic Care Management (AMB) (Signed)
Chronic Care Management Pharmacy Assistant   Name: Ryan Mccall  MRN: [id number] DOB: 02-07-1946  Reason for Encounter: Disease State call for HTN   Recent office visits:  06/14/21 [name] MD. Seen for HTN. No med changes.  Recent consult visits:  06/24/21 (Cardiology) [name], [name] MD. Seen for CAD. No med changes.  Hospital visits:  None   Medications: Outpatient Encounter Medications as of 08/12/2021  Medication Sig   carvedilol (COREG) 6.25 MG tablet Take 1 tablet (6.25 mg total) by mouth 2 (two) times daily.   Cholecalciferol (VITAMIN D) 50 MCG (2000 UT) CAPS Take 1 capsule by mouth daily.   clotrimazole-betamethasone (LOTRISONE) cream Apply 2 gr in the affected area twice a day.   COD LIVER OIL PO Take 1 capsule by mouth daily.   diclofenac Sodium (VOLTAREN) 1 % GEL Apply 2 g topically 2 (two) times daily as needed.   ezetimibe (ZETIA) 10 MG tablet Take 1 tablet (10 mg total) by mouth every other day.   feeding supplement, ENSURE ENLIVE, (ENSURE ENLIVE) LIQD Take 237 mLs by mouth 2 (two) times daily between meals. (Patient taking differently: Take 237 mLs by mouth daily.)   fexofenadine (ALLEGRA) 180 MG tablet Take 180 mg by mouth daily as needed for allergies.   nitroGLYCERIN (NITROSTAT) 0.4 MG SL tablet Place 1 tablet (0.4 mg total) under the tongue as needed for chest pain. May repeat in 15 minutes up to 3 times for chest pain.   rivaroxaban (XARELTO) 20 MG TABS tablet Take 1 tablet (20 mg total) by mouth daily with supper.   silver sulfADIAZINE (SILVADENE) 1 % cream Apply 1 application topically daily. (Patient taking differently: Apply 1 application topically daily as needed.)   simvastatin (ZOCOR) 10 MG tablet Take 1 tablet (10 mg total) by mouth every other day.   spironolactone (ALDACTONE) 25 MG tablet Take 0.5 tablets (12.5 mg total) by mouth daily.   vitamin C (ASCORBIC ACID) 500 MG tablet Take 500 mg by mouth 2 (two) times a week.   No  facility-administered encounter medications on file as of 08/12/2021.     Recent Office Vitals: BP Readings from Last 3 Encounters:  06/24/21 (!) 158/92  06/14/21 130/86  01/09/21 124/73   Pulse Readings from Last 3 Encounters:  06/24/21 66  06/14/21 68  01/09/21 (!) 59    Wt Readings from Last 3 Encounters:  06/24/21 229 lb 12.8 oz (104.2 kg)  06/14/21 230 lb 12.8 oz (104.7 kg)  01/09/21 232 lb (105.2 kg)     Kidney Function Lab Results  Component Value Date/Time   CREATININE 0.88 06/14/2021 08:05 AM   CREATININE 0.82 12/13/2020 07:56 AM   GFR 98.27 01/23/2014 09:42 AM   GFRNONAA 87 06/12/2020 08:04 AM   GFRAA 100 06/12/2020 08:04 AM    BMP Latest Ref Rng & Units 06/14/2021 12/13/2020 06/12/2020  Glucose 65 - 99 mg/dL 99 98 102(H)  BUN 8 - 27 mg/dL 27 18 19   Creatinine 0.76 - 1.27 mg/dL 0.88 0.82 0.84  BUN/Creat Ratio 10 - 24 31(H) 22 23  Sodium 134 - 144 mmol/L 141 140 141  Potassium 3.5 - 5.2 mmol/L 4.9 4.6 4.9  Chloride 96 - 106 mmol/L 104 104 103  CO2 20 - 29 mmol/L 23 22 24   Calcium 8.6 - 10.2 mg/dL 9.5 9.2 9.5     Current antihypertensive regimen:  Carvedilol 6.25 mg two times daily   Patient verbally confirms he is taking the above medications as directed.  Yes  How often are you checking your Blood Pressure? twice daily  he checks his blood pressure in the morning and in the evening before taking his medication.  Current home BP readings: 08/10/21 137/75 64, 118/73 63, 136/85 63, 08/11/21 120/81 64 122/74 65 08/12/21 128/78 65   Wrist or arm cuff: Arm  Caffeine intake: 1 cup of coffee Salt intake:Limited OTC medications including pseudoephedrine or NSAIDs?Pt did no say  Any readings above 180/120? No  What recent interventions/DTPs have been made by any provider to improve Blood Pressure control since last CPP Visit:  No changes   Any recent hospitalizations or ED visits since last visit with CPP? No recent office visits   What diet changes have  been made to improve Blood Pressure Control?  Pt tries to watch his salt intake   What exercise is being done to improve your Blood Pressure Control?  Pt does a lot of yard work   Adherence Review: Is the patient currently on ACE/ARB medication? Yes Does the patient have >5 day gap between last estimated fill dates? No Carvedilol last filled 05/30/21 90ds  Care Gaps: Last annual wellness visit? None noted   Star Rating Drugs:  Medication:  Last Fill: Day Supply  None   Elray Mcgregor, [place] Pharmacist Assistant  [phone]

## 2021-09-18 ENCOUNTER — Telehealth: Payer: Self-pay

## 2021-09-18 ENCOUNTER — Telehealth: Payer: Self-pay | Admitting: Cardiology

## 2021-09-18 NOTE — Telephone Encounter (Signed)
Patient calling the office for samples of medication:   1.  What medication and dosage are you requesting samples for? Xarelto  2.  Are you currently out of this medication? Just a few

## 2021-09-18 NOTE — Telephone Encounter (Signed)
I spoke to patient and informed him that I will leave 2 weeks worth of Xarelto 20 mg samples at front desk.  He verbalized understanding

## 2021-10-14 DIAGNOSIS — Z961 Presence of intraocular lens: Secondary | ICD-10-CM | POA: Diagnosis not present

## 2021-11-05 ENCOUNTER — Other Ambulatory Visit: Payer: Self-pay

## 2021-11-05 MED ORDER — SIMVASTATIN 10 MG PO TABS: 10.0000 mg | ORAL_TABLET | ORAL | 1 refills | Status: DC

## 2021-11-05 MED ORDER — EZETIMIBE 10 MG PO TABS: 10.0000 mg | ORAL_TABLET | ORAL | 1 refills | Status: DC

## 2021-12-11 NOTE — Progress Notes (Signed)
? ?Subjective:  ?Patient ID: Ryan Mccall, male    DOB: January 09, 1946  Age: 76 y.o. MRN: [id number] ? ?Chief Complaint  ?Patient presents with  ? Hypertension  ? ? ?HPI ?  ?Afib: Patient is taking Xarelto 20 mg daily with supper. No bleeding ? ?Hypertension: Patient presents for follow up of hypertension.  Patient tolerating Carvedilol 6.25 mg twice a day well without side effects.  Patient was diagnosed with hypertension and has been treated for hypertension for 10 years.Patient is working on maintaining diet and exercise regimen and follows up as directed. He has CAD- no chest pain. ? ?Patient presents with hyperlipidemia.  Compliance with treatment has been good; patient takes medicines as directed, maintains low cholesterol diet, follows up as directed, and maintains exercise regimen.  Patient is using Simvastatin 10 mg daily without problems.  ?Current Outpatient Medications on File Prior to Visit  ?Medication Sig Dispense Refill  ? carvedilol (COREG) 6.25 MG tablet Take 1 tablet (6.25 mg total) by mouth 2 (two) times daily. 180 tablet 3  ? Cholecalciferol (VITAMIN D) 50 MCG (2000 UT) CAPS Take 1 capsule by mouth daily.    ? clotrimazole-betamethasone (LOTRISONE) cream Apply 2 gr in the affected area twice a day. 45 g 2  ? COD LIVER OIL PO Take 1 capsule by mouth daily.    ? diclofenac Sodium (VOLTAREN) 1 % GEL Apply 2 g topically 2 (two) times daily as needed. 1000 g 2  ? ezetimibe (ZETIA) 10 MG tablet Take 1 tablet (10 mg total) by mouth every other day. 90 tablet 1  ? feeding supplement, ENSURE ENLIVE, (ENSURE ENLIVE) LIQD Take 237 mLs by mouth 2 (two) times daily between meals. (Patient taking differently: Take 237 mLs by mouth daily.) 237 mL 12  ? fexofenadine (ALLEGRA) 180 MG tablet Take 180 mg by mouth daily as needed for allergies.    ? nitroGLYCERIN (NITROSTAT) 0.4 MG SL tablet Place 1 tablet (0.4 mg total) under the tongue as needed for chest pain. May repeat in 15 minutes up to 3 times for chest pain.  30 tablet 2  ? rivaroxaban (XARELTO) 20 MG TABS tablet Take 1 tablet (20 mg total) by mouth daily with supper. 90 tablet 3  ? silver sulfADIAZINE (SILVADENE) 1 % cream Apply 1 application topically daily. (Patient taking differently: Apply 1 application. topically daily as needed.) 50 g 2  ? simvastatin (ZOCOR) 10 MG tablet Take 1 tablet (10 mg total) by mouth every other day. 90 tablet 1  ? spironolactone (ALDACTONE) 25 MG tablet Take 0.5 tablets (12.5 mg total) by mouth daily. 45 tablet 3  ? vitamin C (ASCORBIC ACID) 500 MG tablet Take 500 mg by mouth 2 (two) times a week.    ? ?No current facility-administered medications on file prior to visit.  ? ?Past Medical History:  ?Diagnosis Date  ? Atrial fibrillation ([place])   ? Coronary artery disease   ? s/p remote CABG 1999 per Dr. Cyndia Bent;  Lexiscan Myoview (01/2014):  No ischemia, not gated, normal study  ? COVID-19 03/2019  ? Hypercholesterolemia   ? Hypertension   ? 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 Wears dentures   ? full upper and lower  ? ?Past Surgical History:  ?Procedure Laterality Date  ? CATARACT EXTRACTION W/PHACO Right 01/19/2020  ? Procedure: CATARACT EXTRACTION PHACO AND INTRAOCULAR LENS PLACEMENT ([facility]) RIGHT VISION BLUE 8.60  00:58.6;  Surgeon: Marchia Meiers, MD;  Location: [place];  Service: Ophthalmology;  Laterality: Right;  ? CATARACT EXTRACTION W/PHACO Left 01/09/2021  ? Procedure: CATARACT EXTRACTION PHACO AND INTRAOCULAR LENS PLACEMENT ([place]) LEFT;  Surgeon: Leandrew Koyanagi, MD;  Location: [place];  Service: Ophthalmology;  Laterality: Left;  3.30 ?0:51.6 ?6.4%  ? COLECTOMY WITH COLOSTOMY CREATION/HARTMANN PROCEDURE N/A 07/03/2017  ? Procedure: COLECTOMY WITH COLOSTOMY CREATION/HARTMANN PROCEDURE;  Surgeon: Florene Glen, MD;  Location: [facility];  Service: General;  Laterality: N/A;  ? COLONOSCOPY WITH PROPOFOL N/A 10/27/2017  ? Procedure: COLONOSCOPY WITH PROPOFOL through colostomy;  Surgeon:  Lucilla Lame, MD;  Location: [facility];  Service: Endoscopy;  Laterality: N/A;  ? COLOSTOMY CLOSURE N/A 11/17/2017  ? Procedure: COLOSTOMY CLOSURE;  Surgeon: Florene Glen, MD;  Location: [facility];  Service: General;  Laterality: N/A;  ? CORONARY ARTERY BYPASS GRAFT  1999  ? 4 vessels  ? excision of skin cancer    ? KNEE ARTHROSCOPY W/ MENISCAL REPAIR    ?  ?Family History  ?Problem Relation Age of Onset  ? Lung disease Father 39  ? ?Social History  ? ?Socioeconomic History  ? Marital status: Married  ?  Spouse name: Not on file  ? Number of children: 2  ? Years of education: Not on file  ? Highest education level: Not on file  ?Occupational History  ? Occupation: Print production planner  ?Tobacco Use  ? Smoking status: Former  ?  Types: Cigars  ?  Quit date: 09/29/1997  ?  Years since quitting: 24.2  ? Smokeless tobacco: Former  ?  Types: Chew  ?  Quit date: 1999  ?Vaping Use  ? Vaping Use: Never used  ?Substance and Sexual Activity  ? Alcohol use: Yes  ?  Alcohol/week: 1.0 standard drink  ?  Types: 1 Shots of liquor per week  ?  Comment: one time monthly  ? Drug use: No  ? Sexual activity: Yes  ?  Partners: Female  ?Other Topics Concern  ? Not on file  ?Social History Narrative  ? Not on file  ? ?Social Determinants of Health  ? ?Financial Resource Strain: Not on file  ?Food Insecurity: No Food Insecurity  ? Worried About Charity fundraiser in the Last Year: Never true  ? Ran Out of Food in the Last Year: Never true  ?Transportation Needs: No Transportation Needs  ? Lack of Transportation (Medical): No  ? Lack of Transportation (Non-Medical): No  ?Physical Activity: Sufficiently Active  ? Days of Exercise per Week: 5 days  ? Minutes of Exercise per Session: 30 min  ?Stress: Not on file  ?Social Connections: Not on file  ? ? ?Review of Systems  ?Constitutional:  Negative for chills, fatigue, fever and unexpected weight change.  ?HENT:  Negative for congestion, rhinorrhea, sinus pressure, sneezing and sore  throat.   ?Eyes:  Negative for discharge and visual disturbance.  ?Respiratory:  Negative for cough, shortness of breath and wheezing.   ?Cardiovascular:  Negative for chest pain and palpitations.  ?Gastrointestinal:  Negative for abdominal pain, diarrhea, nausea and vomiting.  ?Endocrine: Negative for polydipsia, polyphagia and polyuria.  ?Genitourinary:  Negative for decreased urine volume, difficulty urinating, dysuria, frequency, penile swelling and urgency.  ?Musculoskeletal:  Negative for back pain, gait problem, joint swelling, neck pain and neck stiffness.  ?Skin: Negative.   ?Neurological:  Negative for dizziness, seizures, weakness, numbness and headaches.  ?Psychiatric/Behavioral:  Negative for confusion, hallucinations, sleep disturbance and suicidal ideas. The patient is not nervous/anxious and is not  hyperactive.   ? ? ?Objective:  ?BP 120/88   Pulse 67   Temp (!) 97 ?F (36.1 ?C)   Ht '5\' 9"'$  (1.753 m)   Wt 229 lb 3.2 oz (104 kg)   SpO2 98%   BMI 33.85 kg/m?  ? ?BP/Weight 12/12/2021 06/24/2021 06/14/2021  ?Systolic BP 818 563 149  ?Diastolic BP 88 92 86  ?Wt. (Lbs) 229.2 229.8 230.8  ?BMI 33.85 33.94 34.08  ? ? ?Physical Exam ?Vitals reviewed.  ?Constitutional:   ?   General: He is not in acute distress. ?   Appearance: Normal appearance. He is obese.  ?HENT:  ?   Head: Normocephalic and atraumatic.  ?   Right Ear: Tympanic membrane normal.  ?   Left Ear: Tympanic membrane normal.  ?   Nose: Nose normal.  ?   Mouth/Throat:  ?   Mouth: Mucous membranes are moist.  ?Eyes:  ?   Extraocular Movements: Extraocular movements intact.  ?   Conjunctiva/sclera: Conjunctivae normal.  ?   Pupils: Pupils are equal, round, and reactive to [place].  ?Cardiovascular:  ?   Rate and Rhythm: Normal rate and regular rhythm.  ?   Pulses: Normal pulses.  ?   Heart sounds: Normal heart sounds. No murmur heard. ?  No gallop.  ?Pulmonary:  ?   Effort: Pulmonary effort is normal. No respiratory distress.  ?   Breath sounds:  Normal breath sounds. No wheezing.  ?Abdominal:  ?   General: Abdomen is flat. Bowel sounds are normal. There is no distension.  ?   Tenderness: There is no abdominal tenderness.  ?Musculoskeletal:     ?   General

## 2021-12-12 ENCOUNTER — Encounter: Payer: Self-pay | Admitting: Legal Medicine

## 2021-12-12 ENCOUNTER — Ambulatory Visit (INDEPENDENT_AMBULATORY_CARE_PROVIDER_SITE_OTHER): Payer: PPO | Admitting: Legal Medicine

## 2021-12-12 ENCOUNTER — Other Ambulatory Visit: Payer: Self-pay

## 2021-12-12 VITALS — BP 120/88 | HR 67 | Temp 97.0°F | Ht 69.0 in | Wt 229.2 lb

## 2021-12-12 DIAGNOSIS — L97921 Non-pressure chronic ulcer of unspecified part of left lower leg limited to breakdown of skin: Secondary | ICD-10-CM

## 2021-12-12 DIAGNOSIS — D6869 Other thrombophilia: Secondary | ICD-10-CM

## 2021-12-12 DIAGNOSIS — Z6833 Body mass index (BMI) 33.0-33.9, adult: Secondary | ICD-10-CM

## 2021-12-12 DIAGNOSIS — E7800 Pure hypercholesterolemia, unspecified: Secondary | ICD-10-CM | POA: Diagnosis not present

## 2021-12-12 DIAGNOSIS — I25708 Atherosclerosis of coronary artery bypass graft(s), unspecified, with other forms of angina pectoris: Secondary | ICD-10-CM | POA: Diagnosis not present

## 2021-12-12 DIAGNOSIS — I10 Essential (primary) hypertension: Secondary | ICD-10-CM | POA: Diagnosis not present

## 2021-12-12 DIAGNOSIS — I4811 Longstanding persistent atrial fibrillation: Secondary | ICD-10-CM | POA: Diagnosis not present

## 2021-12-13 LAB — COMPREHENSIVE METABOLIC PANEL
ALT: 46 IU/L — ABNORMAL HIGH (ref 0–44)
AST: 53 IU/L — ABNORMAL HIGH (ref 0–40)
Albumin/Globulin Ratio: 2.1 (ref 1.2–2.2)
Albumin: 5.1 g/dL — ABNORMAL HIGH (ref 3.7–4.7)
Alkaline Phosphatase: 56 IU/L (ref 44–121)
BUN/Creatinine Ratio: 23 (ref 10–24)
BUN: 21 mg/dL (ref 8–27)
Bilirubin Total: 0.6 mg/dL (ref 0.0–1.2)
CO2: 25 mmol/L (ref 20–29)
Calcium: 9.9 mg/dL (ref 8.6–10.2)
Chloride: 101 mmol/L (ref 96–106)
Creatinine, Ser: 0.92 mg/dL (ref 0.76–1.27)
Globulin, Total: 2.4 g/dL (ref 1.5–4.5)
Glucose: 104 mg/dL — ABNORMAL HIGH (ref 70–99)
Potassium: 4.7 mmol/L (ref 3.5–5.2)
Sodium: 138 mmol/L (ref 134–144)
Total Protein: 7.5 g/dL (ref 6.0–8.5)
eGFR: 87 mL/min/{1.73_m2} (ref >59)

## 2021-12-13 LAB — LIPID PANEL
Chol/HDL Ratio: 2.9 ratio (ref 0.0–5.0)
Cholesterol, Total: 127 mg/dL (ref 100–199)
HDL: 44 mg/dL (ref >39)
LDL Chol Calc (NIH): 66 mg/dL (ref 0–99)
Triglycerides: 86 mg/dL (ref 0–149)
VLDL Cholesterol Cal: 17 mg/dL (ref 5–40)

## 2021-12-13 LAB — CBC WITH DIFFERENTIAL/PLATELET
Basophils Absolute: 0 10*3/uL (ref 0.0–0.2)
Basos: 1 %
EOS (ABSOLUTE): 0.1 10*3/uL (ref 0.0–0.4)
Eos: 3 %
Hematocrit: 45.6 % (ref 37.5–51.0)
Hemoglobin: 15.5 g/dL (ref 13.0–17.7)
Immature Grans (Abs): 0 10*3/uL (ref 0.0–0.1)
Immature Granulocytes: 0 %
Lymphocytes Absolute: 1.3 10*3/uL (ref 0.7–3.1)
Lymphs: 31 %
MCH: 30.5 pg (ref 26.6–33.0)
MCHC: 34 g/dL (ref 31.5–35.7)
MCV: 90 fL (ref 79–97)
Monocytes Absolute: 0.5 10*3/uL (ref 0.1–0.9)
Monocytes: 10 %
Neutrophils Absolute: 2.4 10*3/uL (ref 1.4–7.0)
Neutrophils: 55 %
Platelets: 158 10*3/uL (ref 150–450)
RBC: 5.09 x10E6/uL (ref 4.14–5.80)
RDW: 13.5 % (ref 11.6–15.4)
WBC: 4.4 10*3/uL (ref 3.4–10.8)

## 2021-12-13 LAB — CARDIOVASCULAR RISK ASSESSMENT

## 2021-12-13 NOTE — Progress Notes (Signed)
Glucose 104, kidney tests normal, liver tests slightly up again, Cholesterol normal, CBC normal ?lp

## 2022-01-17 DIAGNOSIS — J309 Allergic rhinitis, unspecified: Secondary | ICD-10-CM | POA: Diagnosis not present

## 2022-01-22 ENCOUNTER — Encounter: Payer: Self-pay | Admitting: Cardiology

## 2022-01-22 ENCOUNTER — Ambulatory Visit (INDEPENDENT_AMBULATORY_CARE_PROVIDER_SITE_OTHER): Payer: PPO | Admitting: Cardiology

## 2022-01-22 VITALS — BP 144/90 | HR 89 | Ht 69.0 in | Wt 229.8 lb

## 2022-01-22 DIAGNOSIS — I482 Chronic atrial fibrillation, unspecified: Secondary | ICD-10-CM

## 2022-01-22 DIAGNOSIS — E7800 Pure hypercholesterolemia, unspecified: Secondary | ICD-10-CM | POA: Diagnosis not present

## 2022-01-22 DIAGNOSIS — I10 Essential (primary) hypertension: Secondary | ICD-10-CM | POA: Diagnosis not present

## 2022-01-22 DIAGNOSIS — I25708 Atherosclerosis of coronary artery bypass graft(s), unspecified, with other forms of angina pectoris: Secondary | ICD-10-CM | POA: Diagnosis not present

## 2022-01-22 NOTE — Progress Notes (Signed)
? ?Ryan Mccall ?Date of Birth: [birth year] ?Medical Record #[id number] ? ?History of Present Illness: ?Ryan Mccall is seen back today for follow up of CAD. He has known CAD, HTN and HLD. He had remote CABG per Dr. Cyndia Bent in 1999 with LIMA to LAD, SVG to 1st DX and SVG to intermediate and OM of the LCX. He did have transient atrial fibrillation during a stress test in 2009. His last stress Myoview in July 2017showed normal perfusion. He was diagnosed with persistent atrial fibrillation in July 2014. Treated with rate control and anticoagulation.  Echocardiogram showed normal LV function with mild mitral insufficiency and moderate left atrial enlargement. ? ?Previously Irbesartan was  discontinued due to low BP.  ? ? On follow up today he is doing well. His BP at home has been well controlled.   He notes he only gets chest tightness when he is mowing in the hot weather. He just slows down. This hasn't changed. He is active walking and doing yard work. He denies any other chest pain, dyspnea, palpitations, no edema.  ? ?Current Outpatient Medications on File Prior to Visit  ?Medication Sig Dispense Refill  ? amoxicillin-clavulanate (AUGMENTIN) 875-125 MG tablet SMARTSIG:1 Tablet(s) By Mouth Every 12 Hours    ? carvedilol (COREG) 6.25 MG tablet Take 1 tablet (6.25 mg total) by mouth 2 (two) times daily. 180 tablet 3  ? Cholecalciferol (VITAMIN D) 50 MCG (2000 UT) CAPS Take 1 capsule by mouth daily.    ? clotrimazole-betamethasone (LOTRISONE) cream Apply 2 gr in the affected area twice a day. 45 g 2  ? COD LIVER OIL PO Take 1 capsule by mouth daily.    ? diclofenac Sodium (VOLTAREN) 1 % GEL Apply 2 g topically 2 (two) times daily as needed. 1000 g 2  ? ezetimibe (ZETIA) 10 MG tablet Take 1 tablet (10 mg total) by mouth every other day. 90 tablet 1  ? feeding supplement, ENSURE ENLIVE, (ENSURE ENLIVE) LIQD Take 237 mLs by mouth 2 (two) times daily between meals. (Patient taking differently: Take 237 mLs by mouth daily.) 237  mL 12  ? fexofenadine (ALLEGRA) 180 MG tablet Take 180 mg by mouth daily as needed for allergies.    ? guaiFENesin-codeine 100-10 MG/5ML syrup SMARTSIG:1 teaspoon By Mouth Every 6 Hours PRN    ? nitroGLYCERIN (NITROSTAT) 0.4 MG SL tablet Place 1 tablet (0.4 mg total) under the tongue as needed for chest pain. May repeat in 15 minutes up to 3 times for chest pain. 30 tablet 2  ? rivaroxaban (XARELTO) 20 MG TABS tablet Take 1 tablet (20 mg total) by mouth daily with supper. 90 tablet 3  ? silver sulfADIAZINE (SILVADENE) 1 % cream Apply 1 application topically daily. (Patient taking differently: Apply 1 application. topically daily as needed.) 50 g 2  ? simvastatin (ZOCOR) 10 MG tablet Take 1 tablet (10 mg total) by mouth every other day. 90 tablet 1  ? spironolactone (ALDACTONE) 25 MG tablet Take 0.5 tablets (12.5 mg total) by mouth daily. 45 tablet 3  ? vitamin C (ASCORBIC ACID) 500 MG tablet Take 500 mg by mouth 2 (two) times a week.    ? ?No current facility-administered medications on file prior to visit.  ? ? ?Allergies  ?Allergen Reactions  ? Shellfish Allergy Nausea And Vomiting  ? ? ?Past Medical History:  ?Diagnosis Date  ? Atrial fibrillation ([place])   ? Coronary artery disease   ? s/p remote CABG 1999 per Dr. Cyndia Bent;  Lexiscan Myoview (  01/2014):  No ischemia, not gated, normal study  ? COVID-19 03/2019  ? Hypercholesterolemia   ? Hypertension   ? 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 Wears dentures   ? full upper and lower  ? ? ?Past Surgical History:  ?Procedure Laterality Date  ? CATARACT EXTRACTION W/PHACO Right 01/19/2020  ? Procedure: CATARACT EXTRACTION PHACO AND INTRAOCULAR LENS PLACEMENT ([place]) RIGHT VISION BLUE 8.60  00:58.6;  Surgeon: Marchia Meiers, MD;  Location: [place];  Service: Ophthalmology;  Laterality: Right;  ? CATARACT EXTRACTION W/PHACO Left 01/09/2021  ? Procedure: CATARACT EXTRACTION PHACO AND INTRAOCULAR LENS PLACEMENT ([place]) LEFT;  Surgeon: Leandrew Koyanagi, MD;  Location: [place];  Service: Ophthalmology;  Laterality: Left;  3.30 ?0:51.6 ?6.4%  ? COLECTOMY WITH COLOSTOMY CREATION/HARTMANN PROCEDURE N/A 07/03/2017  ? Procedure: COLECTOMY WITH COLOSTOMY CREATION/HARTMANN PROCEDURE;  Surgeon: Florene Glen, MD;  Location: [facility];  Service: General;  Laterality: N/A;  ? COLONOSCOPY WITH PROPOFOL N/A 10/27/2017  ? Procedure: COLONOSCOPY WITH PROPOFOL through colostomy;  Surgeon: Lucilla Lame, MD;  Location: [facility];  Service: Endoscopy;  Laterality: N/A;  ? COLOSTOMY CLOSURE N/A 11/17/2017  ? Procedure: COLOSTOMY CLOSURE;  Surgeon: Florene Glen, MD;  Location: [facility];  Service: General;  Laterality: N/A;  ? CORONARY ARTERY BYPASS GRAFT  1999  ? 4 vessels  ? excision of skin cancer    ? KNEE ARTHROSCOPY W/ MENISCAL REPAIR    ? ? ?Social History  ? ?Tobacco Use  ?Smoking Status Former  ? Types: Cigars  ? Quit date: 09/29/1997  ? Years since quitting: 24.3  ?Smokeless Tobacco Former  ? Types: Chew  ? Quit date: 1999  ? ? ?Social History  ? ?Substance and Sexual Activity  ?Alcohol Use Yes  ? Alcohol/week: 1.0 standard drink  ? Types: 1 Shots of liquor per week  ? Comment: one time monthly  ? ? ?Family History  ?Problem Relation Age of Onset  ? Lung disease Father 67  ? ? ?Review of Systems: ?The review of systems is per the HPI.  All other systems were reviewed and are negative. ? ?Physical Exam: ?BP (!) 144/90   Pulse 89   Ht '5\' 9"'$  (1.753 m)   Wt 229 lb 12.8 oz (104.2 kg)   SpO2 99%   BMI 33.94 kg/m?  ? ?GENERAL:  Well appearing WM in NAD ?HEENT:  PERRL, EOMI, sclera are clear. Oropharynx is clear. ?NECK:  No jugular venous distention, carotid upstroke brisk and symmetric, no bruits, no thyromegaly or adenopathy ?LUNGS:  Clear to auscultation bilaterally ?CHEST:  Unremarkable ?HEART:  IRRR,  PMI not displaced or sustained,S1 and S2 within normal limits, no S3, no S4: no clicks, no rubs, no murmurs ?ABD:  Soft, nontender. BS +, no  masses or bruits. No hepatomegaly, no splenomegaly ?EXT:  2 + pulses throughout, no edema, no cyanosis no clubbing ?SKIN:  Warm and dry.  No rashes ?NEURO:  Alert and oriented x 3. Cranial nerves II through XII intact. ?PSYCH:  Cognitively intact ? ? ?LABORATORY DATA:  ? ?Lab Results  ?Component Value Date  ? WBC 4.4 12/12/2021  ? HGB 15.5 12/12/2021  ? HCT 45.6 12/12/2021  ? PLT 158 12/12/2021  ? GLUCOSE 104 (H) 12/12/2021  ? CHOL 127 12/12/2021  ? TRIG 86 12/12/2021  ? HDL 44 12/12/2021  ? [place] 66 12/12/2021  ? ALT 46 (H) 12/12/2021  ? AST 53 (H) 12/12/2021  ? NA 138  12/12/2021  ? K 4.7 12/12/2021  ? CL 101 12/12/2021  ? CREATININE 0.92 12/12/2021  ? BUN 21 12/12/2021  ? CO2 25 12/12/2021  ? TSH 3.48 04/08/2013  ? INR 1.99 07/01/2017  ? ?Labs from 02/18/17:  CBC with platelet count 130K. Marland Kitchen Cholesterol 127, triglycerides 79, LDL 73, HDL 38. A1c 5.6%. CMET normal. ?Dated 10/28/17: cholesterol 126, triglycerides 91, HDL 38, LDL 66. A1c 5.3%. CBC and CMET normal. ?Dated 06/02/18: cholesterol 122, triglycerides 64, HDL 45, LDL 64. A1c 5.3%. CBC and CMET normal ?Dated 10/04/19: A1c 5.4%. cholesterol 116, triglycerides 102, HDL 41, LDL 71. Sodium 132, otherwise CBC and CMET normal. ?Dated 12/12/21: cholesterol 127, triglycerides 86, HDL 44, LDL 66. CBC and CMET normal ? ?Ecg not done today  ? ?Myoview 04/11/16: Study Highlights  ? ?Nuclear stress EF: 54%. The LV systolic function is normal ?There was no ST segment deviation noted during stress. ?The study is normal. no ischemia. No infarction ?This is a low risk study. ?   ? ? ? ?Assessment / Plan: ?1. CAD - remote CABG in 1999 - normal Myoview in July 2017. Patient has class 1 symptoms- stable angina. Continue risk factor modification. Continue Coreg.   ? ?2. Atrial fibrillation - permanent. Rate control is excellent on Coreg. He is asymptomatic. Continue anticoagulation with Xarelto. No bleeding. ? ?3. HTN -  Well  Controlled on Coreg and spironolactone per patient  records ? ?4. HLD - on statin. Well controlled. At goal.  ? ?5. Obesity.encouraged weight loss  ? ? ?Plan I will followup again in 6 months ? ? ? ?

## 2022-02-07 ENCOUNTER — Ambulatory Visit: Payer: PPO | Admitting: Cardiology

## 2022-02-18 ENCOUNTER — Telehealth: Payer: Self-pay

## 2022-02-18 NOTE — Progress Notes (Signed)
Chronic Care Management Pharmacy Assistant   Name: Ryan Mccall  MRN: [id number] DOB: 03-22-1946   Reason for Encounter: Disease State call for HTN    Recent office visits:  12/12/21 Ryan Meeker MD. Seen for CAD. No med changes.   Recent consult visits:  01/22/22 (Cardiology) [name], [name] MD. Seen for CAD. No med changes.   Hospital visits:  None  Medications: Outpatient Encounter Medications as of 02/18/2022  Medication Sig   amoxicillin-clavulanate (AUGMENTIN) 875-125 MG tablet SMARTSIG:1 Tablet(s) By Mouth Every 12 Hours   carvedilol (COREG) 6.25 MG tablet Take 1 tablet (6.25 mg total) by mouth 2 (two) times daily.   Cholecalciferol (VITAMIN D) 50 MCG (2000 UT) CAPS Take 1 capsule by mouth daily.   clotrimazole-betamethasone (LOTRISONE) cream Apply 2 gr in the affected area twice a day.   COD LIVER OIL PO Take 1 capsule by mouth daily.   diclofenac Sodium (VOLTAREN) 1 % GEL Apply 2 g topically 2 (two) times daily as needed.   ezetimibe (ZETIA) 10 MG tablet Take 1 tablet (10 mg total) by mouth every other day.   feeding supplement, ENSURE ENLIVE, (ENSURE ENLIVE) LIQD Take 237 mLs by mouth 2 (two) times daily between meals. (Patient taking differently: Take 237 mLs by mouth daily.)   fexofenadine (ALLEGRA) 180 MG tablet Take 180 mg by mouth daily as needed for allergies.   guaiFENesin-codeine 100-10 MG/5ML syrup SMARTSIG:1 teaspoon By Mouth Every 6 Hours PRN   nitroGLYCERIN (NITROSTAT) 0.4 MG SL tablet Place 1 tablet (0.4 mg total) under the tongue as needed for chest pain. May repeat in 15 minutes up to 3 times for chest pain.   rivaroxaban (XARELTO) 20 MG TABS tablet Take 1 tablet (20 mg total) by mouth daily with supper.   silver sulfADIAZINE (SILVADENE) 1 % cream Apply 1 application topically daily. (Patient taking differently: Apply 1 application. topically daily as needed.)   simvastatin (ZOCOR) 10 MG tablet Take 1 tablet (10 mg total) by mouth every other day.    spironolactone (ALDACTONE) 25 MG tablet Take 0.5 tablets (12.5 mg total) by mouth daily.   vitamin C (ASCORBIC ACID) 500 MG tablet Take 500 mg by mouth 2 (two) times a week.   No facility-administered encounter medications on file as of 02/18/2022.       Recent Office Vitals: BP Readings from Last 3 Encounters:  01/22/22 (!) 144/90  12/12/21 120/88  06/24/21 (!) 158/92   Pulse Readings from Last 3 Encounters:  01/22/22 89  12/12/21 67  06/24/21 66    Wt Readings from Last 3 Encounters:  01/22/22 229 lb 12.8 oz (104.2 kg)  12/12/21 229 lb 3.2 oz (104 kg)  06/24/21 229 lb 12.8 oz (104.2 kg)     Kidney Function Lab Results  Component Value Date/Time   CREATININE 0.92 12/12/2021 07:57 AM   CREATININE 0.88 06/14/2021 08:05 AM   GFR 98.27 01/23/2014 09:42 AM   GFRNONAA 87 06/12/2020 08:04 AM   GFRAA 100 06/12/2020 08:04 AM       Latest Ref Rng & Units 12/12/2021    7:57 AM 06/14/2021    8:05 AM 12/13/2020    7:56 AM  BMP  Glucose 70 - 99 mg/dL 104   99   98    BUN 8 - 27 mg/dL '21   27   18    '$ Creatinine 0.76 - 1.27 mg/dL 0.92   0.88   0.82    BUN/Creat Ratio 10 - 24 23  31   22    Sodium 134 - 144 mmol/L 138   141   140    Potassium 3.5 - 5.2 mmol/L 4.7   4.9   4.6    Chloride 96 - 106 mmol/L 101   104   104    CO2 20 - 29 mmol/L '25   23   22    '$ Calcium 8.6 - 10.2 mg/dL 9.9   9.5   9.2       Current antihypertensive regimen:  Spironolactone '25mg'$  Take 0.5 tablet daily  Carvedilol 6.'25mg'$  two times daily  Patient verbally confirms he is taking the above medications as directed. Yes  How often are you checking your Blood Pressure? Daily   he checks his blood pressure in the morning and in the evening before taking his medication.  Current home BP readings:   02/18/22 129/85  02/17/22 126/86  01/2122 129/78  02/15/22 122/81  Wrist or arm cuff:Arm  Caffeine intake:2 Cups of coffee  Any readings above 180/120? No  What recent interventions/DTPs have been made by  any provider to improve Blood Pressure control since last CPP Visit: No changes   Any recent hospitalizations or ED visits since last visit with CPP? No  What diet changes have been made to improve Blood Pressure Control?  Not watching what he eats. Wife admits he can do better about his diet   What exercise is being done to improve your Blood Pressure Control?  Pt does a lot of yard work and push mows his lawn.   Adherence Review: Is the patient currently on ACE/ARB medication? No Does the patient have >5 day gap between last estimated fill dates? CPP to review  Care Gaps: Last annual wellness visit? None noted   Star Rating Drugs:  Medication:  Last Fill: Day Supply  None noted    Elray Mcgregor, [place] Pharmacist Assistant  [phone]

## 2022-05-26 ENCOUNTER — Telehealth: Payer: Self-pay | Admitting: *Deleted

## 2022-05-26 NOTE — Patient Outreach (Signed)
  Care Coordination   05/26/2022 Name: Ryan Mccall MRN: [id number] DOB: 1945-12-04   Care Coordination Outreach Attempts:  An unsuccessful telephone outreach was attempted today to offer the patient information about available care coordination services as a benefit of their health plan.   Follow Up Plan:  Additional outreach attempts will be made to offer the patient care coordination information and services.   Encounter Outcome:  No Answer  Care Coordination Interventions Activated:  No   Care Coordination Interventions:  No, not indicated    Eduard Clos MSW, LCSW Licensed Clinical Social Worker      [phone]

## 2022-05-29 ENCOUNTER — Telehealth: Payer: Self-pay

## 2022-05-29 NOTE — Patient Outreach (Signed)
  Care Coordination   Initial Visit Note   05/29/2022 Name: Ryan Mccall MRN: [id number] DOB: [birth year]  Ryan Mccall is a 76 y.o. year old male who sees Lillard Anes, MD for primary care. I spoke with  [name] by phone today.  What matters to the patients health and wellness today?   Placed call to patient to explain [facility] care coordination program.  Patient reports to me that he is self managing well. Monitors his BP twice a day and it is normal. Reports he is active and push mows the yard. Denies any needs today.  SDOH assessments and interventions completed:  No     Care Coordination Interventions Activated:  No  Care Coordination Interventions:  No, not indicated   Follow up plan: No further intervention required.   Encounter Outcome:  Pt. Refused   Tomasa Rand, RN, BSN, CEN [facility] [phone]

## 2022-06-16 ENCOUNTER — Encounter: Payer: Self-pay | Admitting: Pharmacist

## 2022-06-16 DIAGNOSIS — G720 Drug-induced myopathy: Secondary | ICD-10-CM

## 2022-06-16 DIAGNOSIS — M791 Myalgia, unspecified site: Secondary | ICD-10-CM

## 2022-06-16 NOTE — Progress Notes (Signed)
[facility])                                            [place] Team                                        Statin Quality Measure Assessment    06/16/2022  HENDRICKS SCHWANDT May 22, 1946 [id number]  Dr. Henrene Pastor,   I am a [facility] clinical pharmacist that reviews patients for statin quality initiatives.     Per review of chart and payor information, patient has a diagnosis of cardiovascular disease but is not currently filling a mod-high potency statin prescription.  This places patient into the [facility] (Statin Use In Patients with Cardiovascular Disease) measure for CMS.    Patient has documented statin intolerance due to myalgia but does not have a statin exclusion code associated with a visit for the current benefit year--M79.10 was associated with a visit last year.  Associating a statin exclusion code with a provider visit would remove the patient from the measure.  Statin exclusion coding is required annually.  Patient is on Zetia.        Component Value Date/Time   CHOL 127 12/12/2021 0757   TRIG 86 12/12/2021 0757   HDL 44 12/12/2021 0757   CHOLHDL 2.9 12/12/2021 0757   CHOLHDL 3 01/23/2014 0942   VLDL 14.2 01/23/2014 0942   LDLCALC 66 12/12/2021 0757     Please consider ONE of the following recommendations:     Myalgia M79.1   Myositis, unspecified M60.9   Myopathy, unspecified G72.9   Rhabdomyolysis M62.82     Please let us know your decision.    Thank you!

## 2022-06-17 ENCOUNTER — Encounter: Payer: Self-pay | Admitting: Legal Medicine

## 2022-06-17 NOTE — Progress Notes (Unsigned)
Subjective:  Patient ID: Ryan Mccall, male    DOB: August 24, 1946  Age: 76 y.o. MRN: [id number]  No chief complaint on file.   HPI Patient presents with hyperlipidemia.  Compliance with treatment has been good; patient takes medicines as directed, maintains low cholesterol diet, follows up as directed, and maintains exercise regimen.  Patient is using Zetia 10 mg daily, Simvastatin 10 mg every other day without problems.    Patient presents for follow up of hypertension.  Patient tolerating Carvedilol 6.25 mg twice a day, spironolactone 12.5 mg daily well without side effects. Patient is working on maintaining diet and exercise regimen and follows up as directed. Complication include Hyperlipidemia.  Afib: Taking Xarelto 20 mg daily. Current Outpatient Medications on File Prior to Visit  Medication Sig Dispense Refill   amoxicillin-clavulanate (AUGMENTIN) 875-125 MG tablet SMARTSIG:1 Tablet(s) By Mouth Every 12 Hours     carvedilol (COREG) 6.25 MG tablet Take 1 tablet (6.25 mg total) by mouth 2 (two) times daily. 180 tablet 3   Cholecalciferol (VITAMIN D) 50 MCG (2000 UT) CAPS Take 1 capsule by mouth daily.     clotrimazole-betamethasone (LOTRISONE) cream Apply 2 gr in the affected area twice a day. 45 g 2   COD LIVER OIL PO Take 1 capsule by mouth daily.     diclofenac Sodium (VOLTAREN) 1 % GEL Apply 2 g topically 2 (two) times daily as needed. 1000 g 2   ezetimibe (ZETIA) 10 MG tablet Take 1 tablet (10 mg total) by mouth every other day. 90 tablet 1   feeding supplement, ENSURE ENLIVE, (ENSURE ENLIVE) LIQD Take 237 mLs by mouth 2 (two) times daily between meals. (Patient taking differently: Take 237 mLs by mouth daily.) 237 mL 12   fexofenadine (ALLEGRA) 180 MG tablet Take 180 mg by mouth daily as needed for allergies.     guaiFENesin-codeine 100-10 MG/5ML syrup SMARTSIG:1 teaspoon By Mouth Every 6 Hours PRN     nitroGLYCERIN (NITROSTAT) 0.4 MG SL tablet Place 1 tablet (0.4 mg total) under  the tongue as needed for chest pain. May repeat in 15 minutes up to 3 times for chest pain. 30 tablet 2   rivaroxaban (XARELTO) 20 MG TABS tablet Take 1 tablet (20 mg total) by mouth daily with supper. 90 tablet 3   silver sulfADIAZINE (SILVADENE) 1 % cream Apply 1 application topically daily. (Patient taking differently: Apply 1 application. topically daily as needed.) 50 g 2   simvastatin (ZOCOR) 10 MG tablet Take 1 tablet (10 mg total) by mouth every other day. 90 tablet 1   spironolactone (ALDACTONE) 25 MG tablet Take 0.5 tablets (12.5 mg total) by mouth daily. 45 tablet 3   vitamin C (ASCORBIC ACID) 500 MG tablet Take 500 mg by mouth 2 (two) times a week.     No current facility-administered medications on file prior to visit.   Past Medical History:  Diagnosis Date   Atrial fibrillation [facility])    Coronary artery disease    s/p remote CABG 1999 per Dr. 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   Wears dentures    full upper and lower  Past Surgical History:  Procedure Laterality Date   CATARACT EXTRACTION W/PHACO Right 01/19/2020   Procedure: CATARACT EXTRACTION PHACO AND INTRAOCULAR LENS PLACEMENT ([place]) RIGHT VISION BLUE 8.60  00:58.6;  Surgeon: Marchia Meiers, MD;  Location: [place];  Service: Ophthalmology;  Laterality: Right;   CATARACT EXTRACTION W/PHACO Left 01/09/2021   Procedure: CATARACT EXTRACTION PHACO AND INTRAOCULAR LENS PLACEMENT ([place]) LEFT;  Surgeon: Leandrew Koyanagi, MD;  Location: [place];  Service: Ophthalmology;  Laterality: Left;  3.30 0:51.6 6.4%   COLECTOMY WITH COLOSTOMY CREATION/HARTMANN  PROCEDURE N/A 07/03/2017   Procedure: COLECTOMY WITH COLOSTOMY CREATION/HARTMANN PROCEDURE;  Surgeon: Florene Glen, MD;  Location: [facility];  Service: General;  Laterality: N/A;   COLONOSCOPY WITH PROPOFOL N/A 10/27/2017   Procedure: COLONOSCOPY WITH PROPOFOL through colostomy;  Surgeon: Lucilla Lame, MD;  Location: [facility];  Service: Endoscopy;  Laterality: N/A;   COLOSTOMY CLOSURE N/A 11/17/2017   Procedure: COLOSTOMY CLOSURE;  Surgeon: Florene Glen, MD;  Location: [facility];  Service: General;  Laterality: N/A;   CORONARY ARTERY BYPASS GRAFT  1999   4 vessels   excision of skin cancer     KNEE ARTHROSCOPY W/ MENISCAL REPAIR      Family History  Problem Relation Age of Onset   Lung disease Father 44   Social History   Socioeconomic History   Marital status: Married    Spouse name: Not on file   Number of children: 2   Years of education: Not on file   Highest education level: Not on file  Occupational History   Occupation: Print production planner  Tobacco Use   Smoking status: Former    Types: Cigars    Quit date: 09/29/1997    Years since quitting: 24.7   Smokeless tobacco: Former    Types: Chew    Quit date: 1999  Vaping Use   Vaping Use: Never used  Substance and Sexual Activity   Alcohol use: Yes    Alcohol/week: 1.0 standard drink of alcohol    Types: 1 Shots of liquor per week    Comment: one time monthly   Drug use: No   Sexual activity: Yes    Partners: Female  Other Topics Concern   Not on file  Social History Narrative   Not on file   Social Determinants of Health   Financial Resource Strain: Not on file  Food Insecurity: No Food Insecurity (02/08/2021)   Hunger Vital Sign    Worried About Running Out of Food in the Last Year: Never true    [place] in the Last Year: Never true  Transportation Needs: No Transportation Needs (02/08/2021)   PRAPARE - Hydrologist (Medical): No    Lack of Transportation  (Non-Medical): No  Physical Activity: Sufficiently Active (02/08/2021)   Exercise Vital Sign    Days of Exercise per Week: 5 days    Minutes of Exercise per Session: 30 min  Stress: Not on file  Social Connections: Not on file    Review of Systems   Objective:  There were no vitals taken for this visit.     01/22/2022    9:51 AM 12/12/2021    7:26 AM 06/24/2021    8:13 AM  BP/Weight  Systolic BP 654 650 354  Diastolic BP 90 88 92  Wt. (Lbs) 229.8 229.2 229.8  BMI 33.94 kg/m2 33.85 kg/m2 33.94 kg/m2    Physical Exam  Diabetic Foot Exam - Simple  No data filed      Lab Results  Component Value Date   WBC 4.4 12/12/2021   HGB 15.5 12/12/2021   HCT 45.6 12/12/2021   PLT 158 12/12/2021   GLUCOSE 104 (H) 12/12/2021   CHOL 127 12/12/2021   TRIG 86 12/12/2021   HDL 44 12/12/2021   LDLCALC 66 12/12/2021   ALT 46 (H) 12/12/2021   AST 53 (H) 12/12/2021   NA 138 12/12/2021   K 4.7 12/12/2021   CL 101 12/12/2021   CREATININE 0.92 12/12/2021   BUN 21 12/12/2021   CO2 25 12/12/2021   TSH 3.48 04/08/2013   INR 1.99 07/01/2017      Assessment & Plan:   Problem List Items Addressed This Visit       Cardiovascular and Mediastinum   Coronary artery disease - Primary   Hypertension   Atrial fibrillation ([place])     Other   Hypercholesterolemia  .  No orders of the defined types were placed in this encounter.   No orders of the defined types were placed in this encounter.    Follow-up: No follow-ups on file.  An After Visit Summary was printed and given to the patient.  [name], MD [facility] [phone]

## 2022-06-18 ENCOUNTER — Other Ambulatory Visit: Payer: Self-pay

## 2022-06-18 ENCOUNTER — Ambulatory Visit (INDEPENDENT_AMBULATORY_CARE_PROVIDER_SITE_OTHER): Payer: PPO | Admitting: Legal Medicine

## 2022-06-18 ENCOUNTER — Encounter: Payer: Self-pay | Admitting: Legal Medicine

## 2022-06-18 VITALS — BP 110/72 | HR 76 | Temp 97.1°F | Resp 16 | Ht 69.0 in | Wt 229.0 lb

## 2022-06-18 DIAGNOSIS — I10 Essential (primary) hypertension: Secondary | ICD-10-CM | POA: Diagnosis not present

## 2022-06-18 DIAGNOSIS — I25708 Atherosclerosis of coronary artery bypass graft(s), unspecified, with other forms of angina pectoris: Secondary | ICD-10-CM

## 2022-06-18 DIAGNOSIS — Z6833 Body mass index (BMI) 33.0-33.9, adult: Secondary | ICD-10-CM | POA: Diagnosis not present

## 2022-06-18 DIAGNOSIS — M545 Low back pain, unspecified: Secondary | ICD-10-CM

## 2022-06-18 DIAGNOSIS — D6869 Other thrombophilia: Secondary | ICD-10-CM | POA: Diagnosis not present

## 2022-06-18 DIAGNOSIS — E7800 Pure hypercholesterolemia, unspecified: Secondary | ICD-10-CM

## 2022-06-18 DIAGNOSIS — I4811 Longstanding persistent atrial fibrillation: Secondary | ICD-10-CM | POA: Diagnosis not present

## 2022-06-18 DIAGNOSIS — Z1159 Encounter for screening for other viral diseases: Secondary | ICD-10-CM | POA: Diagnosis not present

## 2022-06-18 MED ORDER — TRAMADOL HCL 50 MG PO TABS: 50.0000 mg | ORAL_TABLET | Freq: Three times a day (TID) | ORAL | 0 refills | Status: DC | PRN

## 2022-06-19 LAB — LIPID PANEL
Chol/HDL Ratio: 2.8 ratio (ref 0.0–5.0)
Cholesterol, Total: 117 mg/dL (ref 100–199)
HDL: 42 mg/dL (ref >39)
LDL Chol Calc (NIH): 60 mg/dL (ref 0–99)
Triglycerides: 71 mg/dL (ref 0–149)
VLDL Cholesterol Cal: 15 mg/dL (ref 5–40)

## 2022-06-19 LAB — HEPATITIS C ANTIBODY: Hep C Virus Ab: NONREACTIVE

## 2022-06-19 LAB — COMPREHENSIVE METABOLIC PANEL
ALT: 23 IU/L (ref 0–44)
AST: 31 IU/L (ref 0–40)
Albumin/Globulin Ratio: 1.8 (ref 1.2–2.2)
Albumin: 4.6 g/dL (ref 3.8–4.8)
Alkaline Phosphatase: 62 IU/L (ref 44–121)
BUN/Creatinine Ratio: 25 — ABNORMAL HIGH (ref 10–24)
BUN: 22 mg/dL (ref 8–27)
Bilirubin Total: 0.6 mg/dL (ref 0.0–1.2)
CO2: 24 mmol/L (ref 20–29)
Calcium: 9.4 mg/dL (ref 8.6–10.2)
Chloride: 101 mmol/L (ref 96–106)
Creatinine, Ser: 0.87 mg/dL (ref 0.76–1.27)
Globulin, Total: 2.5 g/dL (ref 1.5–4.5)
Glucose: 102 mg/dL — ABNORMAL HIGH (ref 70–99)
Potassium: 5 mmol/L (ref 3.5–5.2)
Sodium: 140 mmol/L (ref 134–144)
Total Protein: 7.1 g/dL (ref 6.0–8.5)
eGFR: 90 mL/min/{1.73_m2} (ref >59)

## 2022-06-19 LAB — CBC WITH DIFFERENTIAL/PLATELET
Basophils Absolute: 0 10*3/uL (ref 0.0–0.2)
Basos: 0 %
EOS (ABSOLUTE): 0.1 10*3/uL (ref 0.0–0.4)
Eos: 2 %
Hematocrit: 43.7 % (ref 37.5–51.0)
Hemoglobin: 14.5 g/dL (ref 13.0–17.7)
Immature Grans (Abs): 0 10*3/uL (ref 0.0–0.1)
Immature Granulocytes: 0 %
Lymphocytes Absolute: 1.1 10*3/uL (ref 0.7–3.1)
Lymphs: 25 %
MCH: 30.2 pg (ref 26.6–33.0)
MCHC: 33.2 g/dL (ref 31.5–35.7)
MCV: 91 fL (ref 79–97)
Monocytes Absolute: 0.4 10*3/uL (ref 0.1–0.9)
Monocytes: 9 %
Neutrophils Absolute: 2.8 10*3/uL (ref 1.4–7.0)
Neutrophils: 64 %
Platelets: 156 10*3/uL (ref 150–450)
RBC: 4.8 x10E6/uL (ref 4.14–5.80)
RDW: 13.5 % (ref 11.6–15.4)
WBC: 4.5 10*3/uL (ref 3.4–10.8)

## 2022-06-19 LAB — CARDIOVASCULAR RISK ASSESSMENT

## 2022-06-20 NOTE — Progress Notes (Signed)
Glucose 102, kidney tests normal, liver tests normal, Hepatitis c normal, Cholesterol normal, CBC normal,  lp

## 2022-06-28 NOTE — Progress Notes (Unsigned)
Ryan Mccall Date of Birth: Jan 11, 1946 Medical Record #[id number]  History of Present Illness: Ryan Mccall is seen back today for follow up of CAD. He has known CAD, HTN and HLD. He had remote CABG per Dr. Cyndia Bent in 1999 with LIMA to LAD, SVG to 1st DX and SVG to intermediate and OM of the LCX. He did have transient atrial fibrillation during a stress test in 2009. His last stress Myoview in July 2017showed normal perfusion. He was diagnosed with persistent atrial fibrillation in July 2014. Treated with rate control and anticoagulation.  Echocardiogram showed normal LV function with mild mitral insufficiency and moderate left atrial enlargement.  Previously Irbesartan was  discontinued due to low BP.    On follow up today he is doing well. His BP at home has been well controlled.   He notes he only gets chest tightness when he is mowing in the hot weather. This hasn't changed. He is active walking and doing yard work. He denies any other chest pain, dyspnea, palpitations, no edema.   Current Outpatient Medications on File Prior to Visit  Medication Sig Dispense Refill   carvedilol (COREG) 6.25 MG tablet Take 1 tablet (6.25 mg total) by mouth 2 (two) times daily. 180 tablet 3   Cholecalciferol (VITAMIN D) 50 MCG (2000 UT) CAPS Take 1 capsule by mouth daily.     clotrimazole-betamethasone (LOTRISONE) cream Apply 2 gr in the affected area twice a day. 45 g 2   COD LIVER OIL PO Take 1 capsule by mouth daily.     diclofenac Sodium (VOLTAREN) 1 % GEL Apply 2 g topically 2 (two) times daily as needed. 1000 g 2   ezetimibe (ZETIA) 10 MG tablet Take 1 tablet (10 mg total) by mouth every other day. 90 tablet 1   feeding supplement, ENSURE ENLIVE, (ENSURE ENLIVE) LIQD Take 237 mLs by mouth 2 (two) times daily between meals. (Patient taking differently: Take 237 mLs by mouth daily.) 237 mL 12   fexofenadine (ALLEGRA) 180 MG tablet Take 180 mg by mouth daily as needed for allergies.     nitroGLYCERIN  (NITROSTAT) 0.4 MG SL tablet Place 1 tablet (0.4 mg total) under the tongue as needed for chest pain. May repeat in 15 minutes up to 3 times for chest pain. 30 tablet 2   rivaroxaban (XARELTO) 20 MG TABS tablet Take 1 tablet (20 mg total) by mouth daily with supper. 90 tablet 3   silver sulfADIAZINE (SILVADENE) 1 % cream Apply 1 application topically daily. (Patient taking differently: Apply 1 application  topically daily as needed.) 50 g 2   simvastatin (ZOCOR) 10 MG tablet Take 1 tablet (10 mg total) by mouth every other day. 90 tablet 1   spironolactone (ALDACTONE) 25 MG tablet Take 0.5 tablets (12.5 mg total) by mouth daily. 45 tablet 3   traMADol (ULTRAM) 50 MG tablet Take 1 tablet (50 mg total) by mouth every 8 (eight) hours as needed. 90 tablet 0   vitamin C (ASCORBIC ACID) 500 MG tablet Take 500 mg by mouth 2 (two) times a week.     No current facility-administered medications on file prior to visit.    Allergies  Allergen Reactions   Shellfish Allergy Nausea And Vomiting    Past Medical History:  Diagnosis Date   Atrial fibrillation ([place])    Coronary artery disease    s/p remote CABG 1999 per Dr. 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;  Service: Ophthalmology;  Laterality: Right;   CATARACT EXTRACTION W/PHACO Left 01/09/2021    Procedure: CATARACT EXTRACTION PHACO AND INTRAOCULAR LENS PLACEMENT ([place]) LEFT;  Surgeon: Leandrew Koyanagi, MD;  Location: [place];  Service: Ophthalmology;  Laterality: Left;  3.30 0:51.6 6.4%   COLECTOMY WITH COLOSTOMY CREATION/HARTMANN PROCEDURE N/A 07/03/2017   Procedure: COLECTOMY WITH COLOSTOMY CREATION/HARTMANN PROCEDURE;  Surgeon: Florene Glen, MD;  Location: [facility];  Service: General;  Laterality: N/A;   COLONOSCOPY WITH PROPOFOL N/A 10/27/2017   Procedure: COLONOSCOPY WITH PROPOFOL through colostomy;  Surgeon: Lucilla Lame, MD;  Location: [facility];  Service: Endoscopy;  Laterality: N/A;   COLOSTOMY CLOSURE N/A 11/17/2017   Procedure: COLOSTOMY CLOSURE;  Surgeon: Florene Glen, MD;  Location: [facility];  Service: General;  Laterality: N/A;   CORONARY ARTERY BYPASS GRAFT  1999   4 vessels   excision of skin cancer     KNEE ARTHROSCOPY W/ MENISCAL REPAIR      Social History   Tobacco Use  Smoking Status Former   Types: Cigars   Quit date: 09/29/1997   Years since quitting: 24.7  Smokeless Tobacco Former   Types: Chew   Quit date: 1999    Social History   Substance and Sexual Activity  Alcohol Use Yes   Alcohol/week: 1.0 standard drink of alcohol   Types: 1 Shots of liquor per week   Comment: one time monthly    Family History  Problem Relation Age of Onset   Lung disease Father 65    Review of Systems: The review of systems is per the HPI.  All other systems were reviewed and are negative.  Physical Exam: BP 118/80   Pulse (!) 59   Ht '5\' 9"'$  (1.753 m)   Wt 223 lb 3.2 oz (101.2 kg)   SpO2 99%   BMI 32.96 kg/m   GENERAL:  Well appearing WM in NAD HEENT:  PERRL, EOMI, sclera are clear. Oropharynx is clear. NECK:  No jugular venous distention, carotid upstroke brisk and symmetric, no bruits, no thyromegaly or adenopathy LUNGS:  Clear to auscultation bilaterally CHEST:  Unremarkable HEART:  IRRR,  PMI not displaced or  sustained,S1 and S2 within normal limits, no S3, no S4: no clicks, no rubs, no murmurs ABD:  Soft, nontender. BS +, no masses or bruits. No hepatomegaly, no splenomegaly EXT:  2 + pulses throughout, no edema, no cyanosis no clubbing SKIN:  Warm and dry.  No rashes NEURO:  Alert and oriented x 3. Cranial nerves II through XII intact. PSYCH:  Cognitively intact   LABORATORY DATA:   Lab Results  Component Value Date   WBC 4.5 06/18/2022   HGB 14.5 06/18/2022   HCT 43.7 06/18/2022   PLT 156 06/18/2022   GLUCOSE 102 (H) 06/18/2022   CHOL 117 06/18/2022   TRIG 71 06/18/2022   HDL 42  06/18/2022   LDLCALC 60 06/18/2022   ALT 23 06/18/2022   AST 31 06/18/2022   NA 140 06/18/2022   K 5.0 06/18/2022   CL 101 06/18/2022   CREATININE 0.87 06/18/2022   BUN 22 06/18/2022   CO2 24 06/18/2022   TSH 3.48 04/08/2013   INR 1.99 07/01/2017   Labs from 02/18/17:  CBC with platelet count 130K. Marland Kitchen Cholesterol 127, triglycerides 79, LDL 73, HDL 38. A1c 5.6%. CMET normal. Dated 10/28/17: cholesterol 126, triglycerides 91, HDL 38, LDL 66. A1c 5.3%. CBC and CMET normal. Dated 06/02/18: cholesterol 122, triglycerides 64, HDL 45, LDL 64. A1c 5.3%. CBC and CMET normal Dated 10/04/19: A1c 5.4%. cholesterol 116, triglycerides 102, HDL 41, LDL 71. Sodium 132, otherwise CBC and CMET normal. Dated 12/12/21: cholesterol 127, triglycerides 86, HDL 44, LDL 66. CBC and CMET normal  Ecg is done today. Afib rate 59. Rightward axis. Otherwise normal. I have personally reviewed and interpreted this study.   Myoview 04/11/16: Study Highlights   Nuclear stress EF: 54%. The LV systolic function is normal There was no ST segment deviation noted during stress. The study is normal. no ischemia. No infarction This is a low risk study.       Assessment / Plan: 1. CAD - remote CABG in 1999 - normal Myoview in July 2017. Patient has class 1 symptoms- stable angina. Continue risk factor modification. Continue Coreg.    2.  Atrial fibrillation - permanent. Rate control is excellent on Coreg. He is asymptomatic. Continue anticoagulation with Xarelto. No bleeding. Recent labs OK.   3. HTN -  Well  Controlled on Coreg and spironolactone   4. HLD - on statin. Well controlled. At goal. LDL 60  5. Obesity.encouraged weight loss    Plan I will followup again in 6 months

## 2022-06-30 ENCOUNTER — Ambulatory Visit: Payer: PPO | Admitting: Cardiology

## 2022-06-30 ENCOUNTER — Encounter: Payer: Self-pay | Admitting: Cardiology

## 2022-06-30 ENCOUNTER — Ambulatory Visit: Payer: PPO | Attending: Cardiology | Admitting: Cardiology

## 2022-06-30 VITALS — BP 118/80 | HR 59 | Ht 69.0 in | Wt 223.2 lb

## 2022-06-30 DIAGNOSIS — I482 Chronic atrial fibrillation, unspecified: Secondary | ICD-10-CM | POA: Diagnosis not present

## 2022-06-30 DIAGNOSIS — E7800 Pure hypercholesterolemia, unspecified: Secondary | ICD-10-CM | POA: Diagnosis not present

## 2022-06-30 DIAGNOSIS — I10 Essential (primary) hypertension: Secondary | ICD-10-CM

## 2022-06-30 DIAGNOSIS — I25708 Atherosclerosis of coronary artery bypass graft(s), unspecified, with other forms of angina pectoris: Secondary | ICD-10-CM

## 2022-06-30 MED ORDER — SPIRONOLACTONE 25 MG PO TABS: 25.0000 mg | ORAL_TABLET | Freq: Every day | ORAL | 3 refills | Status: DC

## 2022-06-30 MED ORDER — CARVEDILOL 6.25 MG PO TABS: 6.2500 mg | ORAL_TABLET | Freq: Two times a day (BID) | ORAL | 3 refills | Status: DC

## 2022-06-30 MED ORDER — RIVAROXABAN 20 MG PO TABS: 20.0000 mg | ORAL_TABLET | Freq: Every day | ORAL | 3 refills | Status: DC

## 2022-08-06 ENCOUNTER — Other Ambulatory Visit: Payer: Self-pay

## 2022-08-06 MED ORDER — SIMVASTATIN 10 MG PO TABS: 10.0000 mg | ORAL_TABLET | ORAL | 1 refills | Status: DC

## 2022-08-06 MED ORDER — EZETIMIBE 10 MG PO TABS: 10.0000 mg | ORAL_TABLET | ORAL | 2 refills | Status: DC

## 2022-08-07 ENCOUNTER — Telehealth: Payer: Self-pay

## 2022-08-07 NOTE — Telephone Encounter (Signed)
Spoke with patient, he has picked up the Zocor, he was complaining of the cost on his Xarelto now that he is in the donut hole, would cost him over $300. Patient was looking for samples. Informed patient to contact his cardiology office for samples and possible copay cards, patient has enough medication for 2-3 more weeks, he will let me know if samples are not available at Cardiology office and we can attempt patient assistance.   Pattricia Boss, [place] Pharmacist Assistant [phone]

## 2022-08-07 NOTE — Telephone Encounter (Signed)
Called Sadie at [facility]. Simvastatin is ready for pick-up. Called patient to let him know.

## 2022-08-19 ENCOUNTER — Telehealth: Payer: Self-pay | Admitting: Cardiology

## 2022-08-19 NOTE — Telephone Encounter (Signed)
Spoke with patient who wanted samples of xarelto '20mg'$  daily. Patient stated he has not signs of bleeding. Approved by PharmD Erasmo Downer. Sample bottles (5) set aside for patient. He will pick them up at [facility].

## 2022-08-19 NOTE — Telephone Encounter (Signed)
Patient calling the office for samples of medication:   1.  What medication and dosage are you requesting samples for? rivaroxaban (XARELTO) 20 MG TABS tablet   2.  Are you currently out of this medication? No, has a week supply left.

## 2022-10-16 DIAGNOSIS — H43813 Vitreous degeneration, bilateral: Secondary | ICD-10-CM | POA: Diagnosis not present

## 2022-10-16 DIAGNOSIS — H35371 Puckering of macula, right eye: Secondary | ICD-10-CM | POA: Diagnosis not present

## 2022-10-16 DIAGNOSIS — Z961 Presence of intraocular lens: Secondary | ICD-10-CM | POA: Diagnosis not present

## 2022-11-26 DIAGNOSIS — J40 Bronchitis, not specified as acute or chronic: Secondary | ICD-10-CM | POA: Diagnosis not present

## 2022-11-26 DIAGNOSIS — R6889 Other general symptoms and signs: Secondary | ICD-10-CM | POA: Diagnosis not present

## 2022-12-08 DIAGNOSIS — R059 Cough, unspecified: Secondary | ICD-10-CM | POA: Diagnosis not present

## 2022-12-08 DIAGNOSIS — Z139 Encounter for screening, unspecified: Secondary | ICD-10-CM | POA: Diagnosis not present

## 2022-12-16 NOTE — Progress Notes (Unsigned)
Subjective:  Patient ID: Ryan Mccall, male    DOB: 12-23-1945  Age: 77 y.o. MRN: [id number]  No chief complaint on file.    History of Present illness:  He was last seen for hypertension {NUMBERS 1-12:18279} {days/wks/mos/yrs:310907} ago.  BP at that visit was ***. Management includes ***.  He reports {excellent/good/fair/poor:19665} compliance with treatment. He {is/is not:9024} having side effects. {document side effects if present:1} He is following a {diet:21022986} diet. He {is/is not:9024} exercising. He {does/does not:200015} smoke.  Use of agents associated with hypertension: {bp agents assoc with hypertension:511::"none"}.   Outside blood pressures are {***enter patient reported home BP readings, or 'not being checked':1}.  Pertinent labs: Lab Results  Component Value Date   CHOL 117 06/18/2022   HDL 42 06/18/2022   LDLCALC 60 06/18/2022   TRIG 71 06/18/2022   CHOLHDL 2.8 06/18/2022   Lab Results  Component Value Date   NA 140 06/18/2022   K 5.0 06/18/2022   CREATININE 0.87 06/18/2022   EGFR 90 06/18/2022   GFRNONAA 87 06/12/2020   GLUCOSE 102 (H) 06/18/2022     The ASCVD Risk score (Arnett DK, et al., 2019) failed to calculate for the following reasons:   The valid total cholesterol range is 130 to 320 mg/dL    Lipid/Cholesterol, Follow-up  Last lipid panel Other pertinent labs  Lab Results  Component Value Date   CHOL 117 06/18/2022   HDL 42 06/18/2022   LDLCALC 60 06/18/2022   TRIG 71 06/18/2022   CHOLHDL 2.8 06/18/2022   Lab Results  Component Value Date   ALT 23 06/18/2022   AST 31 06/18/2022   PLT 156 06/18/2022   TSH 3.48 04/08/2013     He was last seen for this {1-12:18279} {days/wks/mos/yrs:310907} ago.  Management includes simvastatin 10 mg OD.  He reports {excellent/good/fair/poor:19665} compliance with treatment. He {is/is not:9024} having side effects. {document side effects if present:1}  The ASCVD Risk score (Arnett DK, et  al., 2019) failed to calculate for the following reasons:   The valid total cholesterol range is 130 to 320 mg/dL   For Atrial Fibrillation: Xarelto 20 mg with Supper     Current Outpatient Medications on File Prior to Visit  Medication Sig Dispense Refill   carvedilol (COREG) 6.25 MG tablet Take 1 tablet (6.25 mg total) by mouth 2 (two) times daily. 180 tablet 3   Cholecalciferol (VITAMIN D) 50 MCG (2000 UT) CAPS Take 1 capsule by mouth daily.     clotrimazole-betamethasone (LOTRISONE) cream Apply 2 gr in the affected area twice a day. 45 g 2   COD LIVER OIL PO Take 1 capsule by mouth daily.     diclofenac Sodium (VOLTAREN) 1 % GEL Apply 2 g topically 2 (two) times daily as needed. 1000 g 2   ezetimibe (ZETIA) 10 MG tablet Take 1 tablet (10 mg total) by mouth every other day. 90 tablet 2   feeding supplement, ENSURE ENLIVE, (ENSURE ENLIVE) LIQD Take 237 mLs by mouth 2 (two) times daily between meals. (Patient taking differently: Take 237 mLs by mouth daily.) 237 mL 12   fexofenadine (ALLEGRA) 180 MG tablet Take 180 mg by mouth daily as needed for allergies.     nitroGLYCERIN (NITROSTAT) 0.4 MG SL tablet Place 1 tablet (0.4 mg total) under the tongue as needed for chest pain. May repeat in 15 minutes up to 3 times for chest pain. 30 tablet 2   rivaroxaban (XARELTO) 20 MG TABS tablet Take 1 tablet (20  mg total) by mouth daily with supper. 90 tablet 3   silver sulfADIAZINE (SILVADENE) 1 % cream Apply 1 application topically daily. (Patient taking differently: Apply 1 application  topically daily as needed.) 50 g 2   simvastatin (ZOCOR) 10 MG tablet Take 1 tablet (10 mg total) by mouth every other day. 90 tablet 1   spironolactone (ALDACTONE) 25 MG tablet Take 1 tablet (25 mg total) by mouth daily. 90 tablet 3   traMADol (ULTRAM) 50 MG tablet Take 1 tablet (50 mg total) by mouth every 8 (eight) hours as needed. 90 tablet 0   vitamin C (ASCORBIC ACID) 500 MG tablet Take 500 mg by mouth 2 (two)  times a week.     No current facility-administered medications on file prior to visit.   Past Medical History:  Diagnosis Date   Atrial fibrillation [facility])    Coronary artery disease    s/p remote CABG 1999 per Dr. 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   Wears dentures    full upper and lower   Past Surgical History:  Procedure Laterality Date   CATARACT EXTRACTION W/PHACO Right 01/19/2020   Procedure: CATARACT EXTRACTION PHACO AND INTRAOCULAR LENS PLACEMENT ([place]) RIGHT VISION BLUE 8.60  00:58.6;  Surgeon: Marchia Meiers, MD;  Location: [facility];  Service: Ophthalmology;  Laterality: Right;   CATARACT EXTRACTION W/PHACO Left 01/09/2021   Procedure: CATARACT EXTRACTION PHACO AND INTRAOCULAR LENS PLACEMENT ([place]) LEFT;  Surgeon: Leandrew Koyanagi, MD;  Location: [facility];  Service: Ophthalmology;  Laterality: Left;  3.30 0:51.6 6.4%   COLECTOMY WITH COLOSTOMY CREATION/HARTMANN PROCEDURE N/A 07/03/2017   Procedure: COLECTOMY WITH COLOSTOMY CREATION/HARTMANN PROCEDURE;  Surgeon: Florene Glen, MD;  Location: [facility];  Service: General;  Laterality: N/A;   COLONOSCOPY WITH PROPOFOL N/A 10/27/2017   Procedure: COLONOSCOPY WITH PROPOFOL through colostomy;  Surgeon: Lucilla Lame, MD;  Location: [facility];  Service: Endoscopy;  Laterality: N/A;   COLOSTOMY CLOSURE N/A 11/17/2017   Procedure: COLOSTOMY CLOSURE;  Surgeon: Florene Glen, MD;  Location: [facility];  Service: General;  Laterality: N/A;   CORONARY ARTERY BYPASS GRAFT  1999   4 vessels   excision of skin cancer     KNEE ARTHROSCOPY W/ MENISCAL REPAIR       Family History  Problem Relation Age of Onset   Lung disease Father 94   Social History   Socioeconomic History   Marital status: Married    Spouse name: Not on file   Number of children: 2   Years of education: Not on file   Highest education level: Not on file  Occupational History   Occupation: Print production planner  Tobacco Use   Smoking status: Former    Types: Cigars    Quit date: 09/29/1997    Years since quitting: 25.2   Smokeless tobacco: Former    Types: Chew    Quit date: 1999  Vaping Use   Vaping Use: Never used  Substance and Sexual Activity   Alcohol use: Yes    Alcohol/week: 1.0 standard drink of alcohol    Types: 1 Shots of liquor per week  Comment: one time monthly   Drug use: No   Sexual activity: Yes    Partners: Female  Other Topics Concern   Not on file  Social History Narrative   Not on file   Social Determinants of Health   Financial Resource Strain: Not on file  Food Insecurity: No Food Insecurity (02/08/2021)   Hunger Vital Sign    Worried About Running Out of Food in the Last Year: Never true    [place] in the Last Year: Never true  Transportation Needs: No Transportation Needs (02/08/2021)   PRAPARE - Hydrologist (Medical): No    Lack of Transportation (Non-Medical): No  Physical Activity: Sufficiently Active (02/08/2021)   Exercise Vital Sign    Days of Exercise per Week: 5 days    Minutes of Exercise per Session: 30 min  Stress: Not on file  Social Connections: Not on file    Review of Systems  Constitutional:  Negative for chills, diaphoresis, fatigue and fever.  HENT:  Negative for congestion, ear pain and sore throat.   Respiratory:  Negative for cough and shortness of breath.   Cardiovascular:  Negative for chest pain and palpitations.  Gastrointestinal:  Negative for abdominal pain, constipation, diarrhea, nausea and vomiting.  Endocrine: Negative for polydipsia, polyphagia and  polyuria.  Genitourinary:  Negative for dysuria and frequency.  Musculoskeletal:  Negative for arthralgias, back pain and myalgias.  Neurological:  Negative for dizziness and headaches.  Psychiatric/Behavioral:  Negative for dysphoric mood. The patient is not nervous/anxious.      Objective:  There were no vitals taken for this visit.     06/30/2022    8:11 AM 06/18/2022    7:30 AM 01/22/2022    9:51 AM  BP/Weight  Systolic BP 123456 [id number]  Diastolic BP 80 72 90  Wt. (Lbs) 223.2 229 229.8  BMI 32.96 kg/m2 33.82 kg/m2 33.94 kg/m2    Physical Exam Vitals reviewed.  Constitutional:      Appearance: Normal appearance.  HENT:     Right Ear: Tympanic membrane normal.     Left Ear: Tympanic membrane normal.     Nose: Nose normal.     Mouth/Throat:     Pharynx: No oropharyngeal exudate or posterior oropharyngeal erythema.  Eyes:     Conjunctiva/sclera: Conjunctivae normal.  Neck:     Vascular: No carotid bruit.  Cardiovascular:     Rate and Rhythm: Normal rate and regular rhythm.     Pulses: Normal pulses.     Heart sounds: Normal heart sounds.  Pulmonary:     Effort: Pulmonary effort is normal.     Breath sounds: Normal breath sounds.  Abdominal:     General: Bowel sounds are normal.     Palpations: There is no mass.     Tenderness: There is no abdominal tenderness.  Musculoskeletal:     Cervical back: Normal range of motion.  Skin:    Findings: No lesion.  Neurological:     Mental Status: He is alert and oriented to person, place, and time.  Psychiatric:        Mood and Affect: Mood normal.        Behavior: Behavior normal.     Diabetic Foot Exam - Simple   No data filed        12/12/2021    7:29 AM 06/14/2021    7:35 AM 02/08/2021   10:08 AM 12/21/2019    1:34 PM  Depression screen PHQ 2/9  Decreased Interest 0 0 0 0  Down, Depressed, Hopeless 0 0 0 0  PHQ - 2 Score 0 0 0 0       01/19/2020    7:45 AM 01/09/2021   12:39 PM 06/14/2021    7:35 AM 12/12/2021     7:29 AM 06/18/2022    7:33 AM  Fall Risk  Falls in the past year?   0 0 1  Was there an injury with Fall?   0 0 1  Fall Risk Category Calculator   0 0 2  Fall Risk Category (Retired)   Low Low Moderate  (RETIRED) Patient Fall Risk Level Low fall risk Low fall risk Low fall risk Low fall risk Low fall risk  Patient at Risk for Falls Due to     No Fall Risks  Fall risk Follow up     Falls evaluation completed    Lab Results  Component Value Date   WBC 4.5 06/18/2022   HGB 14.5 06/18/2022   HCT 43.7 06/18/2022   PLT 156 06/18/2022   GLUCOSE 102 (H) 06/18/2022   CHOL 117 06/18/2022   TRIG 71 06/18/2022   HDL 42 06/18/2022   LDLCALC 60 06/18/2022   ALT 23 06/18/2022   AST 31 06/18/2022   NA 140 06/18/2022   K 5.0 06/18/2022   CL 101 06/18/2022   CREATININE 0.87 06/18/2022   BUN 22 06/18/2022   CO2 24 06/18/2022   TSH 3.48 04/08/2013   INR 1.99 07/01/2017      Assessment & Plan:   There are no diagnoses linked to this encounter.   Follow-up: No follow-ups on file.  An After Visit Summary was printed and given to the patient.  I, Neil Crouch have reviewed all documentation for this visit. The documentation on 12/16/22   for the exam, diagnosis, procedures, and orders are all accurate and complete.    Neil Crouch, DNP, [facility] [phone]

## 2022-12-17 ENCOUNTER — Encounter: Payer: Self-pay | Admitting: Nurse Practitioner

## 2022-12-17 ENCOUNTER — Ambulatory Visit (INDEPENDENT_AMBULATORY_CARE_PROVIDER_SITE_OTHER): Payer: PPO | Admitting: Nurse Practitioner

## 2022-12-17 ENCOUNTER — Ambulatory Visit: Payer: PPO | Admitting: Nurse Practitioner

## 2022-12-17 VITALS — BP 122/84 | HR 71 | Temp 96.0°F | Ht 69.0 in | Wt 226.0 lb

## 2022-12-17 DIAGNOSIS — Z125 Encounter for screening for malignant neoplasm of prostate: Secondary | ICD-10-CM | POA: Diagnosis not present

## 2022-12-17 DIAGNOSIS — I10 Essential (primary) hypertension: Secondary | ICD-10-CM | POA: Diagnosis not present

## 2022-12-17 DIAGNOSIS — Z6833 Body mass index (BMI) 33.0-33.9, adult: Secondary | ICD-10-CM | POA: Diagnosis not present

## 2022-12-17 DIAGNOSIS — E7800 Pure hypercholesterolemia, unspecified: Secondary | ICD-10-CM | POA: Diagnosis not present

## 2022-12-17 DIAGNOSIS — I4811 Longstanding persistent atrial fibrillation: Secondary | ICD-10-CM

## 2022-12-17 MED ORDER — RIVAROXABAN 20 MG PO TABS: 20.0000 mg | ORAL_TABLET | Freq: Every day | ORAL | 0 refills | Status: DC

## 2022-12-17 NOTE — Assessment & Plan Note (Addendum)
Xarelto Coupons provided to the patient and referral to chronic care management in place to continue support with medicine Lab work will be assessed today  Regular Follow up with [name]

## 2022-12-17 NOTE — Assessment & Plan Note (Addendum)
Well controlled Home blood pressures logs are well controlled Weight is up and down from 222-226 lb Continue Coreg 6.25 mg BD and Spironolactone 25 mg daily Labs will be drawn today  Nutrition: Stressed importance of moderation in sodium intake, saturated fat and cholesterol, caloric balance, sufficient intake of complex carbohydrates, fiber, calcium and iron.   Exercise: Stressed the importance of regular exercise.

## 2022-12-17 NOTE — Patient Instructions (Signed)
Will call you regarding lab Follow up in 6 months    Atrial Fibrillation Atrial fibrillation (AFib) is a type of irregular or rapid heartbeat (arrhythmia). In AFib, the top part of the heart (atria) beats in an irregular pattern. This makes the heart unable to pump blood normally and effectively. The goal of treatment is to prevent blood clots from forming, control your heart rate, or restore your heartbeat to a normal rhythm. If this condition is not treated, it can cause serious problems, such as a weakened heart muscle (cardiomyopathy) or a stroke. What are the causes? This condition is often caused by medical conditions that damage the heart's electrical system. These include: High blood pressure (hypertension). This is the most common cause. Certain heart problems or conditions, such as heart failure, coronary artery disease, heart valve problems, or heart surgery. Diabetes. Overactive thyroid (hyperthyroidism). Chronic kidney disease. Certain lung conditions, such as emphysema, pneumonia, or COPD. Obstructive sleep apnea. In some cases, the cause of this condition is not known. What increases the risk? This condition is more likely to develop in: Older adults. Athletes who do endurance exercise. People who have a family history of AFib. Males. People who are Caucasian. People who are obese. People who smoke or misuse alcohol. What are the signs or symptoms? Symptoms of this condition include: Fast or irregular heartbeats (palpitations). Discomfort or pain in your chest. Shortness of breath. Sudden Dingee-headedness or weakness. Tiring easily during exercise or activity. Syncope (fainting). Sweating. In some cases, there are no symptoms. How is this diagnosed? Your health care provider may detect AFib when taking your pulse. If detected, this condition may be diagnosed with: An electrocardiogram (ECG) to check electrical signals of the heart. An ambulatory cardiac monitor  to record your heart's activity for a few days. A transthoracic echocardiogram (TTE) to create pictures of your heart. A transesophageal echocardiogram (TEE) to create even clearer pictures of your heart. A stress test to check your blood supply while you exercise. Imaging tests, such as a CT scan or chest X-ray. Blood tests. How is this treated? Treatment depends on underlying conditions and how you feel when you get AFib. This condition may be treated with: Medicines to prevent blood clots or to treat heart rate or heart rhythm problems. Electrical cardioversion to reset the heart's rhythm. A pacemaker to correct abnormal heart rhythm. Ablation to remove the heart tissue that sends abnormal signals. Left atrial appendage closure to seal the area where blood clots can form. In some cases, underlying conditions will be treated. Follow these instructions at home: Medicines Take over-the counter and prescription medicines only as told by your provider. Do not take any new medicines without talking to your provider. If you are taking blood thinners: Talk with your provider before taking aspirin or NSAIDs. These medicines can raise your risk of bleeding. Take your medicines as told. Take them at the same time each day. Do not do things that could hurt or bruise you. Be careful to avoid falls. Wear an alert bracelet or carry a card that says that you take blood thinners. Lifestyle Do not use any products that contain nicotine or tobacco. These products include cigarettes, chewing tobacco, and vaping devices, such as e-cigarettes. If you need help quitting, ask your provider. Eat heart-healthy foods. Talk with a food expert (dietitian) to make an eating plan that is right for you. Exercise regularly as told by your provider. Do not drink alcohol. Lose weight if you are overweight. General instructions If  you have obstructive sleep apnea, manage your condition as told by your provider. Do not  use diet pills unless your provider approves. Diet pills can make heart problems worse. Keep all follow-up visits. Your provider will want to check your heart rate and rhythm regularly. Contact a health care provider if: You notice a change in the rate, rhythm, or strength of your heartbeat. You are taking a blood thinner and you notice more bruising. You tire more easily when you exercise or do heavy work. You have a sudden change in weight. Get help right away if:  You have chest pain. You have trouble breathing. You have side effects of blood thinners, such as blood in your vomit, poop (stool), or pee (urine), or bleeding that does not stop. You have any symptoms of a stroke. "BE FAST" is an easy way to remember the main warning signs of a stroke: B - Balance. Signs are dizziness, sudden trouble walking, or loss of balance. E - Eyes. Signs are trouble seeing or a sudden change in vision. F - Face. Signs are sudden weakness or numbness of the face, or the face or eyelid drooping on one side. A - Arms. Signs are weakness or numbness in an arm. This happens suddenly and usually on one side of the body. S - Speech.Signs are sudden trouble speaking, slurred speech, or trouble understanding what people say. T - Time. Time to call emergency services. Write down what time symptoms started. Other signs of a stroke, such as: A sudden, severe headache with no known cause. Nausea or vomiting. Seizure. These symptoms may be an emergency. Get help right away. Call 911. Do not wait to see if the symptoms will go away. Do not drive yourself to the hospital. This information is not intended to replace advice given to you by your health care provider. Make sure you discuss any questions you have with your health care provider. Document Revised: 06/04/2022 Document Reviewed: 06/04/2022 Elsevier Patient Education  [place].

## 2022-12-17 NOTE — Assessment & Plan Note (Signed)
Well controlled  Continue simvastatin 10 mg OD and Zetia 10 mg OD

## 2022-12-17 NOTE — Assessment & Plan Note (Signed)
Lipid and A1C will be checked today Continue working with diet and aerobic exercise

## 2022-12-18 LAB — COMPREHENSIVE METABOLIC PANEL
ALT: 32 IU/L (ref 0–44)
AST: 35 IU/L (ref 0–40)
Albumin/Globulin Ratio: 1.9 (ref 1.2–2.2)
Albumin: 4.4 g/dL (ref 3.8–4.8)
Alkaline Phosphatase: 65 IU/L (ref 44–121)
BUN/Creatinine Ratio: 23 (ref 10–24)
BUN: 19 mg/dL (ref 8–27)
Bilirubin Total: 0.6 mg/dL (ref 0.0–1.2)
CO2: 23 mmol/L (ref 20–29)
Calcium: 9.5 mg/dL (ref 8.6–10.2)
Chloride: 102 mmol/L (ref 96–106)
Creatinine, Ser: 0.81 mg/dL (ref 0.76–1.27)
Globulin, Total: 2.3 g/dL (ref 1.5–4.5)
Glucose: 103 mg/dL — ABNORMAL HIGH (ref 70–99)
Potassium: 4.7 mmol/L (ref 3.5–5.2)
Sodium: 139 mmol/L (ref 134–144)
Total Protein: 6.7 g/dL (ref 6.0–8.5)
eGFR: 92 mL/min/{1.73_m2} (ref >59)

## 2022-12-18 LAB — CBC WITH DIFFERENTIAL/PLATELET
Basophils Absolute: 0 10*3/uL (ref 0.0–0.2)
Basos: 1 %
EOS (ABSOLUTE): 0.1 10*3/uL (ref 0.0–0.4)
Eos: 5 %
Hematocrit: 38.2 % (ref 37.5–51.0)
Hemoglobin: 14.5 g/dL (ref 13.0–17.7)
Immature Grans (Abs): 0 10*3/uL (ref 0.0–0.1)
Immature Granulocytes: 0 %
Lymphocytes Absolute: 1 10*3/uL (ref 0.7–3.1)
Lymphs: 36 %
MCH: 34.6 pg — ABNORMAL HIGH (ref 26.6–33.0)
MCHC: 38 g/dL — ABNORMAL HIGH (ref 31.5–35.7)
MCV: 91 fL (ref 79–97)
Monocytes Absolute: 0.3 10*3/uL (ref 0.1–0.9)
Monocytes: 9 %
Neutrophils Absolute: 1.4 10*3/uL (ref 1.4–7.0)
Neutrophils: 49 %
Platelets: 153 10*3/uL (ref 150–450)
RBC: 4.19 x10E6/uL (ref 4.14–5.80)
RDW: 13.8 % (ref 11.6–15.4)
WBC: 2.8 10*3/uL — ABNORMAL LOW (ref 3.4–10.8)

## 2022-12-18 LAB — PROTIME-INR
INR: 1.2 (ref 0.9–1.2)
Prothrombin Time: 13.3 s — ABNORMAL HIGH (ref 9.1–12.0)

## 2022-12-18 LAB — PSA: Prostate Specific Ag, Serum: 1 ng/mL (ref 0.0–4.0)

## 2022-12-18 LAB — LIPID PANEL
Chol/HDL Ratio: 3.1 ratio (ref 0.0–5.0)
Cholesterol, Total: 136 mg/dL (ref 100–199)
HDL: 44 mg/dL (ref >39)
LDL Chol Calc (NIH): 77 mg/dL (ref 0–99)
Triglycerides: 75 mg/dL (ref 0–149)
VLDL Cholesterol Cal: 15 mg/dL (ref 5–40)

## 2022-12-18 LAB — CARDIOVASCULAR RISK ASSESSMENT

## 2022-12-18 LAB — TSH: TSH: 2.92 u[IU]/mL (ref 0.450–4.500)

## 2022-12-18 LAB — HEMOGLOBIN A1C
Est. average glucose Bld gHb Est-mCnc: 108 mg/dL
Hgb A1c MFr Bld: 5.4 % (ref 4.8–5.6)

## 2022-12-25 ENCOUNTER — Telehealth: Payer: Self-pay

## 2022-12-25 NOTE — Progress Notes (Signed)
  Chronic Care Management   Note  12/25/2022 Name: Ryan Mccall MRN: [id number] DOB: 08-27-1946  PHELPS EICHE is a 77 y.o. year old male who is a primary care patient of Cox, Kirsten, MD. I reached out to [name] by phone today in response to a referral sent by Mr. Jameire Hoots Degnan's PCP.  Mr. Enciso was given information about Chronic Care Management services today including:  CCM service includes personalized support from designated clinical staff supervised by the physician, including individualized plan of care and coordination with other care providers 24/7 contact phone numbers for assistance for urgent and routine care needs. Service will only be billed when office clinical staff spend 20 minutes or more in a month to coordinate care. Only one practitioner may furnish and bill the service in a calendar month. The patient may stop CCM services at amy time (effective at the end of the month) by phone call to the office staff. The patient will be responsible for cost sharing (co-pay) or up to 20% of the service fee (after annual deductible is met)  Mr. TREYVONN EDGELL  declinedto scheduling an appointment with the CCM Pharmacist   Follow up plan: Patient did not agree to scheduling an appointment with the Pharmacist. The ordering provider has been notified.   Noreene Larsson, [place], [place] [address] Direct Dial: [phone] [email or web address]

## 2023-01-14 ENCOUNTER — Other Ambulatory Visit: Payer: Self-pay

## 2023-01-14 MED ORDER — SIMVASTATIN 10 MG PO TABS: 10.0000 mg | ORAL_TABLET | ORAL | 1 refills | Status: DC

## 2023-01-14 MED ORDER — EZETIMIBE 10 MG PO TABS: 10.0000 mg | ORAL_TABLET | ORAL | 1 refills | Status: DC

## 2023-01-17 ENCOUNTER — Ambulatory Visit: Payer: PPO | Admitting: Nurse Practitioner

## 2023-01-21 ENCOUNTER — Telehealth: Payer: Self-pay

## 2023-01-21 NOTE — Progress Notes (Signed)
Care Management & Coordination Services Pharmacy Team  Reason for Encounter: General adherence update   Contacted patient for general health update and medication adherence call.  Spoke with patient on 01/21/2023    What concerns do you have about your medications? Pt denies any concerns   The patient denies side effects with their medications.   How often do you forget or accidentally miss a dose? Never  Do you use a pillbox? No  Are you having any problems getting your medications from your pharmacy? No  Has the cost of your medications been a concern? No  Since last visit with PharmD, no interventions have been made.   The patient has not had an ED visit since last contact.   The patient reports the following problems with their health. Left shoulder is tingling and giving him troubles. It radiates down to his fingers. He is experiencing pain and it goes numb in his thumb and other fingers with some stiffness. Pt take tylenol for pain. Pt stated he has had a cough for 3 weeks. Pt had a sinus infection around the same time but his cough is lingering. Pt is going to make an appt to come in and have a chest xray. Pt denies wheezing and any SOB.   Patient denies concerns or questions for Artelia Laroche, PharmD at this time. Scheduled f/u in May  Counseled patient on: [place] job taking medications   Chart Updates:  Recent office visits:  12/17/22 Lurline Del FNP. Seen for routine visit. No med changes.   Recent consult visits:  None  Hospital visits:  None  Medications: Outpatient Encounter Medications as of 01/21/2023  Medication Sig   carvedilol (COREG) 6.25 MG tablet Take 1 tablet (6.25 mg total) by mouth 2 (two) times daily.   Cholecalciferol (VITAMIN D) 50 MCG (2000 UT) CAPS Take 1 capsule by mouth daily.   clotrimazole-betamethasone (LOTRISONE) cream Apply 2 gr in the affected area twice a day.   COD LIVER OIL PO Take 1 capsule by mouth daily.   diclofenac Sodium  (VOLTAREN) 1 % GEL Apply 2 g topically 2 (two) times daily as needed.   ezetimibe (ZETIA) 10 MG tablet Take 1 tablet (10 mg total) by mouth every other day.   feeding supplement, ENSURE ENLIVE, (ENSURE ENLIVE) LIQD Take 237 mLs by mouth 2 (two) times daily between meals. (Patient taking differently: Take 237 mLs by mouth daily.)   fexofenadine (ALLEGRA) 180 MG tablet Take 180 mg by mouth daily as needed for allergies.   nitroGLYCERIN (NITROSTAT) 0.4 MG SL tablet Place 1 tablet (0.4 mg total) under the tongue as needed for chest pain. May repeat in 15 minutes up to 3 times for chest pain.   rivaroxaban (XARELTO) 20 MG TABS tablet Take 1 tablet (20 mg total) by mouth daily with supper.   silver sulfADIAZINE (SILVADENE) 1 % cream Apply 1 application topically daily. (Patient taking differently: Apply 1 application  topically daily as needed.)   simvastatin (ZOCOR) 10 MG tablet Take 1 tablet (10 mg total) by mouth every other day.   spironolactone (ALDACTONE) 25 MG tablet Take 1 tablet (25 mg total) by mouth daily.   traMADol (ULTRAM) 50 MG tablet Take 1 tablet (50 mg total) by mouth every 8 (eight) hours as needed.   vitamin C (ASCORBIC ACID) 500 MG tablet Take 500 mg by mouth 2 (two) times a week.   No facility-administered encounter medications on file as of 01/21/2023.    Recent vitals BP Readings  from Last 3 Encounters:  12/17/22 122/84  06/30/22 118/80  06/18/22 110/72   Pulse Readings from Last 3 Encounters:  12/17/22 71  06/30/22 (!) 59  06/18/22 76   Wt Readings from Last 3 Encounters:  12/17/22 226 lb (102.5 kg)  06/30/22 223 lb 3.2 oz (101.2 kg)  06/18/22 229 lb (103.9 kg)   BMI Readings from Last 3 Encounters:  12/17/22 33.37 kg/m  06/30/22 32.96 kg/m  06/18/22 33.82 kg/m    Recent lab results    Component Value Date/Time   NA 139 12/17/2022 0804   K 4.7 12/17/2022 0804   CL 102 12/17/2022 0804   CO2 23 12/17/2022 0804   GLUCOSE 103 (H) 12/17/2022 0804   GLUCOSE  112 (H) 11/23/2017 0803   BUN 19 12/17/2022 0804   CREATININE 0.81 12/17/2022 0804   CALCIUM 9.5 12/17/2022 0804    Lab Results  Component Value Date   CREATININE 0.81 12/17/2022   GFR 98.27 01/23/2014   EGFR 92 12/17/2022   GFRNONAA 87 06/12/2020   GFRAA 100 06/12/2020   Lab Results  Component Value Date/Time   HGBA1C 5.4 12/17/2022 08:04 AM    Lab Results  Component Value Date   CHOL 136 12/17/2022   HDL 44 12/17/2022   LDLCALC 77 12/17/2022   TRIG 75 12/17/2022   CHOLHDL 3.1 12/17/2022    Care Gaps: Annual wellness visit in last year? No  If Diabetic:None noted  Last eye exam / retinopathy screening: Last diabetic foot exam: Last UACR:   Star Rating Drugs:  Medication:  Last Fill: Day Supply Simvastatin   10/13/22-08/06/22 90ds  Roxana Hires, CMA Clinical Pharmacist Assistant  [phone]

## 2023-01-27 NOTE — Progress Notes (Signed)
Ryan Mccall Date of Birth: [birth year] Medical Record #[id number]  History of Present Illness: Ryan Mccall is seen back today for follow up of CAD. He has known CAD, HTN and HLD. He had remote CABG per Dr. Laneta Simmers in 1999 with LIMA to LAD, SVG to 1st DX and SVG to intermediate and OM of the LCX. He did have transient atrial fibrillation during a stress test in 2009. His last stress Myoview in July 2017showed normal perfusion. He was diagnosed with persistent atrial fibrillation in July 2014. Treated with rate control and anticoagulation.  Echocardiogram showed normal LV function with mild mitral insufficiency and moderate left atrial enlargement.  Previously Irbesartan was  discontinued due to low BP.    On follow up today he is doing well. His BP at home has been well controlled.   He does get SOB when he is mowing. This hasn't changed. He is active walking and doing yard work. He denies any other chest pain, dyspnea, palpitations, no edema. Has occasional nosebleeds.   Current Outpatient Medications on File Prior to Visit  Medication Sig Dispense Refill   carvedilol (COREG) 6.25 MG tablet Take 1 tablet (6.25 mg total) by mouth 2 (two) times daily. 180 tablet 3   Cholecalciferol (VITAMIN D) 50 MCG (2000 UT) CAPS Take 1 capsule by mouth daily.     clotrimazole-betamethasone (LOTRISONE) cream Apply 2 gr in the affected area twice a day. 45 g 2   COD LIVER OIL PO Take 1 capsule by mouth daily.     diclofenac Sodium (VOLTAREN) 1 % GEL Apply 2 g topically 2 (two) times daily as needed. 1000 g 2   ezetimibe (ZETIA) 10 MG tablet Take 1 tablet (10 mg total) by mouth every other day. 90 tablet 1   feeding supplement, ENSURE ENLIVE, (ENSURE ENLIVE) LIQD Take 237 mLs by mouth 2 (two) times daily between meals. (Patient taking differently: Take 237 mLs by mouth daily.) 237 mL 12   fexofenadine (ALLEGRA) 180 MG tablet Take 180 mg by mouth daily as needed for allergies.     silver sulfADIAZINE (SILVADENE) 1  % cream Apply 1 application topically daily. (Patient taking differently: Apply 1 application  topically daily as needed.) 50 g 2   simvastatin (ZOCOR) 10 MG tablet Take 1 tablet (10 mg total) by mouth every other day. 90 tablet 1   spironolactone (ALDACTONE) 25 MG tablet Take 1 tablet (25 mg total) by mouth daily. 90 tablet 3   traMADol (ULTRAM) 50 MG tablet Take 1 tablet (50 mg total) by mouth every 8 (eight) hours as needed. 90 tablet 0   vitamin C (ASCORBIC ACID) 500 MG tablet Take 500 mg by mouth 2 (two) times a week.     No current facility-administered medications on file prior to visit.    Allergies  Allergen Reactions   Shellfish Allergy Nausea And Vomiting    Past Medical History:  Diagnosis Date   Atrial fibrillation (HCC)    Coronary artery disease    s/p remote CABG 1999 per Dr. 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   Wears dentures    full upper and  lower    Past Surgical History:  Procedure Laterality Date   CATARACT EXTRACTION W/PHACO Right 01/19/2020   Procedure: CATARACT EXTRACTION PHACO AND INTRAOCULAR LENS PLACEMENT (IOC) RIGHT VISION BLUE 8.60  00:58.6;  Surgeon: Elliot Cousin, MD;  Location: [facility];  Service: Ophthalmology;  Laterality: Right;   CATARACT EXTRACTION W/PHACO Left 01/09/2021   Procedure: CATARACT EXTRACTION PHACO AND INTRAOCULAR LENS PLACEMENT (IOC) LEFT;  Surgeon: Lockie Mola, MD;  Location: [facility];  Service: Ophthalmology;  Laterality: Left;  3.30 0:51.6 6.4%   COLECTOMY WITH COLOSTOMY CREATION/HARTMANN PROCEDURE N/A 07/03/2017   Procedure: COLECTOMY WITH COLOSTOMY  CREATION/HARTMANN PROCEDURE;  Surgeon: Lattie Haw, MD;  Location: [facility];  Service: General;  Laterality: N/A;   COLONOSCOPY WITH PROPOFOL N/A 10/27/2017   Procedure: COLONOSCOPY WITH PROPOFOL through colostomy;  Surgeon: Midge Minium, MD;  Location: [facility];  Service: Endoscopy;  Laterality: N/A;   COLOSTOMY CLOSURE N/A 11/17/2017   Procedure: COLOSTOMY CLOSURE;  Surgeon: Lattie Haw, MD;  Location: [facility];  Service: General;  Laterality: N/A;   CORONARY ARTERY BYPASS GRAFT  1999   4 vessels   excision of skin cancer     KNEE ARTHROSCOPY W/ MENISCAL REPAIR      Social History   Tobacco Use  Smoking Status Former   Types: Cigars   Quit date: 09/29/1997   Years since quitting: 25.3  Smokeless Tobacco Former   Types: Chew   Quit date: 1999    Social History   Substance and Sexual Activity  Alcohol Use Yes   Alcohol/week: 1.0 standard drink of alcohol   Types: 1 Shots of liquor per week   Comment: one time monthly    Family History  Problem Relation Age of Onset   Lung disease Father 77    Review of Systems: The review of systems is per the HPI.  All other systems were reviewed and are negative.  Physical Exam: BP 134/82   Pulse 64   Ht 5\' 9"  (1.753 m)   Wt 224 lb (101.6 kg)   SpO2 97%   BMI 33.08 kg/m   GENERAL:  Well appearing WM in NAD HEENT:  PERRL, EOMI, sclera are clear. Oropharynx is clear. NECK:  No jugular venous distention, carotid upstroke brisk and symmetric, no bruits, no thyromegaly or adenopathy LUNGS:  Clear to auscultation bilaterally CHEST:  Unremarkable HEART:  IRRR,  PMI not displaced or sustained,S1 and S2 within normal limits, no S3, no S4: no clicks, no rubs, no murmurs ABD:  Soft, nontender. BS +, no masses or bruits. No hepatomegaly, no splenomegaly EXT:  2 + pulses throughout, no edema, no cyanosis no clubbing SKIN:  Warm and dry.  No rashes NEURO:  Alert and oriented x 3. Cranial nerves II through XII  intact. PSYCH:  Cognitively intact   LABORATORY DATA:   Lab Results  Component Value Date   WBC 2.8 (L) 12/17/2022   HGB 14.5 12/17/2022   HCT 38.2 12/17/2022   PLT 153 12/17/2022   GLUCOSE 103 (H) 12/17/2022   CHOL 136 12/17/2022   TRIG 75 12/17/2022   HDL 44 12/17/2022   LDLCALC 77 12/17/2022   ALT 32 12/17/2022   AST 35 12/17/2022   NA 139 12/17/2022   K 4.7 12/17/2022   CL 102 12/17/2022   CREATININE 0.81 12/17/2022   BUN 19 12/17/2022   CO2 23 12/17/2022   TSH 2.920 12/17/2022   INR 1.2 12/17/2022   HGBA1C 5.4 12/17/2022   Labs from  02/18/17:  CBC with platelet count 130K. Marland Kitchen Cholesterol 127, triglycerides 79, LDL 73, HDL 38. A1c 5.6%. CMET normal. Dated 10/28/17: cholesterol 126, triglycerides 91, HDL 38, LDL 66. A1c 5.3%. CBC and CMET normal. Dated 06/02/18: cholesterol 122, triglycerides 64, HDL 45, LDL 64. A1c 5.3%. CBC and CMET normal Dated 10/04/19: A1c 5.4%. cholesterol 116, triglycerides 102, HDL 41, LDL 71. Sodium 132, otherwise CBC and CMET normal. Dated 12/12/21: cholesterol 127, triglycerides 86, HDL 44, LDL 66. CBC and CMET normal  Ecg is not done today.    Myoview 04/11/16: Study Highlights   Nuclear stress EF: 54%. The LV systolic function is normal There was no ST segment deviation noted during stress. The study is normal. no ischemia. No infarction This is a low risk study.       Assessment / Plan: 1. CAD - remote CABG in 1999 - normal Myoview in July 2017. Patient has class 1-2  symptoms with dyspnea- stable angina. Continue risk factor modification. Continue Coreg.  We need to update Lexiscan Myoview.   2. Atrial fibrillation - permanent. Rate control is excellent on Coreg. He is asymptomatic. Continue anticoagulation with Xarelto. No bleeding. Recent labs OK.   3. HTN -  Well  Controlled on Coreg and spironolactone   4. HLD - on statin. Has been well controlled. Now  LDL up to  77. Needs to tighten up on his diet. Will repeat in 6 months.   5.  Obesity.encouraged weight loss    Plan I will followup again in 6 months with lab

## 2023-01-30 ENCOUNTER — Ambulatory Visit: Payer: PPO | Attending: Cardiology | Admitting: Cardiology

## 2023-01-30 ENCOUNTER — Encounter: Payer: Self-pay | Admitting: Cardiology

## 2023-01-30 VITALS — BP 134/82 | HR 64 | Ht 69.0 in | Wt 224.0 lb

## 2023-01-30 DIAGNOSIS — I4811 Longstanding persistent atrial fibrillation: Secondary | ICD-10-CM

## 2023-01-30 DIAGNOSIS — E7800 Pure hypercholesterolemia, unspecified: Secondary | ICD-10-CM

## 2023-01-30 DIAGNOSIS — I10 Essential (primary) hypertension: Secondary | ICD-10-CM | POA: Diagnosis not present

## 2023-01-30 DIAGNOSIS — I482 Chronic atrial fibrillation, unspecified: Secondary | ICD-10-CM | POA: Diagnosis not present

## 2023-01-30 DIAGNOSIS — I25708 Atherosclerosis of coronary artery bypass graft(s), unspecified, with other forms of angina pectoris: Secondary | ICD-10-CM

## 2023-01-30 MED ORDER — RIVAROXABAN 20 MG PO TABS: 20.0000 mg | ORAL_TABLET | Freq: Every day | ORAL | 3 refills | Status: DC

## 2023-01-30 MED ORDER — NITROGLYCERIN 0.4 MG SL SUBL: 0.4000 mg | SUBLINGUAL_TABLET | SUBLINGUAL | 2 refills | Status: DC | PRN

## 2023-01-30 NOTE — Addendum Note (Signed)
Addended by: Neoma Laming on: 01/30/2023 08:23 AM   Modules accepted: Orders

## 2023-01-30 NOTE — Patient Instructions (Signed)
Medication Instructions:  Continue same medications *If you need a refill on your cardiac medications before your next appointment, please call your pharmacy*   Lab Work: Have cbc,cmet,lipid panel in 6 months 1 week before appointment  Lab order enclosed   Testing/Procedures: Lexiscan    Follow-Up: At [facility], you and your health needs are our priority.  As part of our continuing mission to provide you with exceptional heart care, we have created designated Provider Care Teams.  These Care Teams include your primary Cardiologist (physician) and Advanced Practice Providers (APPs -  Physician Assistants and Nurse Practitioners) who all work together to provide you with the care you need, when you need it.  We recommend signing up for the patient portal called "MyChart".  Sign up information is provided on this After Visit Summary.  MyChart is used to connect with patients for Virtual Visits (Telemedicine).  Patients are able to view lab/test results, encounter notes, upcoming appointments, etc.  Non-urgent messages can be sent to your provider as well.   To learn more about what you can do with MyChart, go to [email or web address].    Your next appointment:  6 months    Provider:  Dr.Jordan

## 2023-02-05 ENCOUNTER — Telehealth (HOSPITAL_COMMUNITY): Payer: Self-pay

## 2023-02-05 NOTE — Telephone Encounter (Signed)
Spoke with the patient, detailed instructions given. He stated that he would be here for his test. Asked to call back with any questions. S.Syria Kestner EMTP/CCT 

## 2023-02-09 ENCOUNTER — Other Ambulatory Visit: Payer: Self-pay | Admitting: Cardiology

## 2023-02-09 DIAGNOSIS — I25708 Atherosclerosis of coronary artery bypass graft(s), unspecified, with other forms of angina pectoris: Secondary | ICD-10-CM

## 2023-02-10 ENCOUNTER — Ambulatory Visit (HOSPITAL_COMMUNITY): Payer: PPO | Attending: Cardiology

## 2023-02-10 DIAGNOSIS — I4811 Longstanding persistent atrial fibrillation: Secondary | ICD-10-CM | POA: Diagnosis not present

## 2023-02-10 DIAGNOSIS — I25708 Atherosclerosis of coronary artery bypass graft(s), unspecified, with other forms of angina pectoris: Secondary | ICD-10-CM

## 2023-02-10 DIAGNOSIS — E7800 Pure hypercholesterolemia, unspecified: Secondary | ICD-10-CM | POA: Insufficient documentation

## 2023-02-10 DIAGNOSIS — I482 Chronic atrial fibrillation, unspecified: Secondary | ICD-10-CM | POA: Diagnosis not present

## 2023-02-10 DIAGNOSIS — I10 Essential (primary) hypertension: Secondary | ICD-10-CM | POA: Diagnosis not present

## 2023-02-10 LAB — MYOCARDIAL PERFUSION IMAGING
LV dias vol: 113 mL (ref 62–150)
LV sys vol: 48 mL
Nuc Stress EF: 58 %
Peak HR: 85 {beats}/min
Rest HR: 59 {beats}/min
Rest Nuclear Isotope Dose: 10.6 mCi
SDS: 0
SRS: 0
SSS: 0
ST Depression (mm): 0 mm
Stress Nuclear Isotope Dose: 32.8 mCi
TID: 0.96

## 2023-02-10 MED ORDER — REGADENOSON 0.4 MG/5ML IV SOLN
0.4000 mg | Freq: Once | INTRAVENOUS | Status: AC
Start: 2023-02-10 — End: 2023-02-10
  Administered 2023-02-10: 0.4 mg via INTRAVENOUS

## 2023-02-10 MED ORDER — TECHNETIUM TC 99M TETROFOSMIN IV KIT
32.8000 | PACK | Freq: Once | INTRAVENOUS | Status: AC | PRN
  Administered 2023-02-10: 32.8 via INTRAVENOUS

## 2023-02-10 MED ORDER — TECHNETIUM TC 99M TETROFOSMIN IV KIT
10.6000 | PACK | Freq: Once | INTRAVENOUS | Status: AC | PRN
  Administered 2023-02-10: 10.6 via INTRAVENOUS

## 2023-02-20 ENCOUNTER — Telehealth: Payer: Self-pay

## 2023-02-20 NOTE — Progress Notes (Signed)
Care Management & Coordination Services Pharmacy Team  Reason for Encounter: Appointment Reminder  Contacted patient to confirm telephone appointment with Artelia Laroche, PharmD on 02/24/23 at 8:00 am.  Spoke with patient on 02/20/2023   Do you have any problems getting your medications? No  What is your top health concern you would like to discuss at your upcoming visit?  Left shoulder is tingling and giving him troubles. It radiates down to his fingers. He is experiencing pain and it goes numb in his thumb and other fingers with some stiffness. Pt take tylenol for pain.  Have you seen any other providers since your last visit with PCP? No   Chart review:  Recent office visits:  None  Recent consult visits:  01/30/23 [place], [name] MD (Cardiology). Seen for follow up. No med changes.   Hospital visits:  None   Star Rating Drugs:  Medication:  Last Fill: Day Supply Simvastatin                 01/20/23-10/13/22 90ds   Care Gaps: Annual wellness visit in last year? No  If Diabetic:N/A Last eye exam / retinopathy screening: Last diabetic foot exam:   Roxana Hires, [facility] Clinical Pharmacist Assistant  [phone]

## 2023-02-24 ENCOUNTER — Ambulatory Visit: Payer: PPO

## 2023-02-24 NOTE — Progress Notes (Signed)
Patient is schedule to see Dr. Sedalia Muta Friday for his shoulder.

## 2023-02-24 NOTE — Patient Outreach (Signed)
Care Management & Coordination Services Pharmacy Note  02/24/2023 Name:  Ryan Mccall MRN:  [id number] DOB:  May 17, 1946  Summary: -Pleasant male presents for f/u visit. He retired Sept 2019. He worked in Physiological scientist, Arts administrator and other jobs. He still gets up every morning at 4AM and goes out to get breakfast every day. 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 Cardio note even mentions his LDL is higher than goal. Will co-sign PCP to see if we can increase statin as there is no Dx code stating he has Hx of myalgias -Patient never had AWV last year, will co-sign PCP to see if we can get one this year -Patient's shoulder has been bothering him (Tingling down arm). Co-signed [facility] to schedule f/u  Subjective: Ryan Mccall is an 77 y.o. year old male who is a primary patient of Cox, Kirsten, MD.  The care coordination team was consulted for assistance with disease management and care coordination needs.    Engaged with patient by telephone for follow up visit.   Recent office visits:  None   Recent consult visits:  01/30/23 [place], [name] MD (Cardiology). Seen for follow up. No med changes.    Hospital visits:  None   Objective:  Lab Results  Component Value Date   CREATININE 0.81 12/17/2022   BUN 19 12/17/2022   GFR 98.27 01/23/2014   EGFR 92 12/17/2022   GFRNONAA 87 06/12/2020   GFRAA 100 06/12/2020   NA 139 12/17/2022   K 4.7 12/17/2022   CALCIUM 9.5 12/17/2022   CO2 23 12/17/2022   GLUCOSE 103 (H) 12/17/2022    Lab Results  Component Value Date/Time   HGBA1C 5.4 12/17/2022 08:04 AM   GFR 98.27 01/23/2014 09:42 AM   GFR 97.16 04/08/2013 09:19 AM    Last diabetic Eye exam: No results found for: "HMDIABEYEEXA"  Last diabetic Foot exam: No results found for: "HMDIABFOOTEX"   Lab Results  Component Value Date   CHOL 136 12/17/2022   HDL 44 12/17/2022   LDLCALC 77  12/17/2022   TRIG 75 12/17/2022   CHOLHDL 3.1 12/17/2022       Latest Ref Rng & Units 12/17/2022    8:04 AM 06/18/2022    7:58 AM 12/12/2021    7:57 AM  Hepatic Function  Total Protein 6.0 - 8.5 g/dL 6.7  7.1  7.5   Albumin 3.8 - 4.8 g/dL 4.4  4.6  5.1   AST 0 - 40 IU/L 35  31  53   ALT 0 - 44 IU/L 32  23  46   Alk Phosphatase 44 - 121 IU/L 65  62  56   Total Bilirubin 0.0 - 1.2 mg/dL 0.6  0.6  0.6     Lab Results  Component Value Date/Time   TSH 2.920 12/17/2022 08:04 AM   TSH 3.48 04/08/2013 09:19 AM       Latest Ref Rng & Units 12/17/2022    8:04 AM 06/18/2022    7:58 AM 12/12/2021    7:57 AM  CBC  WBC 3.4 - 10.8 x10E3/uL 2.8  4.5  4.4   Hemoglobin 13.0 - 17.7 g/dL 40.9  81.1  91.4   Hematocrit 37.5 - 51.0 % 38.2  43.7  45.6   Platelets 150 - 450 x10E3/uL 153  156  158     No results found for: "VD25OH", "VITAMINB12"  Clinical ASCVD: Yes  The 10-year ASCVD  risk score (Arnett DK, et al., 2019) is: 28.2%   Values used to calculate the score:     Age: 67 years     Sex: Male     Is Non-Hispanic African American: No     Diabetic: No     Tobacco smoker: No     Systolic Blood Pressure: 134 mmHg     Is BP treated: Yes     HDL Cholesterol: 44 mg/dL     Total Cholesterol: 136 mg/dL    Other: ([id number] if Afib, MMRC or CAT for COPD, ACT, DEXA)     12/17/2022    7:30 AM 12/12/2021    7:29 AM 06/14/2021    7:35 AM  Depression screen PHQ 2/9  Decreased Interest 0 0 0  Down, Depressed, Hopeless 0 0 0  PHQ - 2 Score 0 0 0     Social History   Tobacco Use  Smoking Status Former   Types: Cigars   Quit date: 09/29/1997   Years since quitting: 25.4  Smokeless Tobacco Former   Types: Chew   Quit date: 1999   BP Readings from Last 3 Encounters:  01/30/23 134/82  12/17/22 122/84  06/30/22 118/80   Pulse Readings from Last 3 Encounters:  01/30/23 64  12/17/22 71  06/30/22 (!) 59   Wt Readings from Last 3 Encounters:  02/10/23 224 lb (101.6 kg)  01/30/23 224  lb (101.6 kg)  12/17/22 226 lb (102.5 kg)   BMI Readings from Last 3 Encounters:  02/10/23 33.08 kg/m  01/30/23 33.08 kg/m  12/17/22 33.37 kg/m    Allergies  Allergen Reactions   Shellfish Allergy Nausea And Vomiting    Medications Reviewed Today     Reviewed by [place], [name] M, MD (Physician) on 01/30/23 at 0805  Med List Status: <None>   Medication Order Taking? Sig Documenting Provider Last Dose Status Informant  carvedilol (COREG) 6.25 MG tablet [id number] Yes Take 1 tablet (6.25 mg total) by mouth 2 (two) times daily. [place], [name] M, MD Taking Active   Cholecalciferol (VITAMIN D) 50 MCG (2000 UT) CAPS [id number] Yes Take 1 capsule by mouth daily. [provider] Taking Active   clotrimazole-betamethasone (LOTRISONE) cream [id number] Yes Apply 2 gr in the affected area twice a day. Abigail Miyamoto, MD Taking Active   COD LIVER OIL PO [id number] Yes Take 1 capsule by mouth daily. [provider] Taking Active Self  diclofenac Sodium (VOLTAREN) 1 % GEL [id number] Yes Apply 2 g topically 2 (two) times daily as needed. Abigail Miyamoto, MD Taking Active   ezetimibe (ZETIA) 10 MG tablet [id number] Yes Take 1 tablet (10 mg total) by mouth every other day. Cox, Kirsten, MD Taking Active   feeding supplement, ENSURE Lujean Amel [place]) LIQD [id number] Yes Take 237 mLs by mouth 2 (two) times daily between meals.  Patient taking differently: Take 237 mLs by mouth daily.   Henrene Dodge, MD Taking Active   fexofenadine (ALLEGRA) 180 MG tablet [id number] Yes Take 180 mg by mouth daily as needed for allergies. [provider] Taking Active Self  nitroGLYCERIN (NITROSTAT) 0.4 MG SL tablet [id number] Yes Place 1 tablet (0.4 mg total) under the tongue as needed for chest pain. May repeat in 15 minutes up to 3 times for chest pain. Abigail Miyamoto, MD Taking Active   rivaroxaban (XARELTO) 20 MG TABS tablet [id number]  Take 1 tablet (20 mg total) by mouth  daily with supper. Lurline Del, FNP  Active   silver  sulfADIAZINE (SILVADENE) 1 % cream [id number] Yes Apply 1 application topically daily.  Patient taking differently: Apply 1 application  topically daily as needed.   Abigail Miyamoto, MD Taking Active   simvastatin (ZOCOR) 10 MG tablet [id number] Yes Take 1 tablet (10 mg total) by mouth every other day. Cox, Kirsten, MD Taking Active   spironolactone (ALDACTONE) 25 MG tablet [id number] Yes Take 1 tablet (25 mg total) by mouth daily. [place], [name] M, MD Taking Active   traMADol Janean Sark) 50 MG tablet [id number] Yes Take 1 tablet (50 mg total) by mouth every 8 (eight) hours as needed. Abigail Miyamoto, MD Taking Active   vitamin C (ASCORBIC ACID) 500 MG tablet [id number] Yes Take 500 mg by mouth 2 (two) times a week. [provider] Taking Active             SDOH:  (Social Determinants of Health) assessments and interventions performed: Yes SDOH Interventions    Flowsheet Row Care Coordination from 02/24/2023 in [facility]  SDOH Interventions   Transportation Interventions Intervention Not Indicated  Financial Strain Interventions Other (Comment)  [Samples of Rivaroxaban]       Medication Assistance: None required.  Patient affirms current coverage meets needs.  Medication Access: Name and location of current pharmacy:  [id number] - [place], [place] - [address] AT [facility] [address] [place] [address] Phone: [phone] Fax: [phone]  [facility] [place], [place] - [address] [place] [address] [place] [address] Phone: [phone] Fax: [phone]   Compliance/Adherence/Medication fill history: Star Rating Drugs:  Medication:                Last Fill:         Day Supply Simvastatin                 01/20/23-10/13/22 90ds    Care Gaps: Annual wellness visit in last year?  No   Assessment/Plan   Hyperlipidemia: (LDL goal < 70) -CABG per Dr. Laneta Simmers in 1999 with LIMA to LAD, SVG to 1st DX and SVG to intermediate and OM of the LCX  The 10-year ASCVD risk score (Arnett DK, et al., 2019) is: 28.2%   Values used to calculate the score:     Age: 23 years     Sex: Male     Is Non-Hispanic African American: No     Diabetic: No     Tobacco smoker: No     Systolic Blood Pressure: 134 mmHg     Is BP treated: Yes     HDL Cholesterol: 44 mg/dL     Total Cholesterol: 136 mg/dL Lab Results  Component Value Date   CHOL 136 12/17/2022   CHOL 117 06/18/2022   CHOL 127 12/12/2021   Lab Results  Component Value Date   HDL 44 12/17/2022   HDL 42 06/18/2022   HDL 44 12/12/2021   Lab Results  Component Value Date   LDLCALC 77 12/17/2022   LDLCALC 60 06/18/2022   LDLCALC 66 12/12/2021   Lab Results  Component Value Date   TRIG 75 12/17/2022   TRIG 71 06/18/2022   TRIG 86 12/12/2021   Lab Results  Component Value Date   CHOLHDL 3.1 12/17/2022   CHOLHDL 2.8 06/18/2022   CHOLHDL 2.9 12/12/2021   No results found for: "LDLDIRECT" Last vitamin D No results found for: "25OHVITD2", "25OHVITD3", "VD25OH" Lab Results  Component Value Date  TSH 2.920 12/17/2022   -Not ideally controlled -Current treatment: Simvastatin every other day 10mg  Query Appropriate,  -Medications previously tried: None  -Current dietary patterns: "Tries to eat healthy" -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  -Controlled -CHADSVASC: 3 -Current treatment: Rate control:  Carvedilol 6.25mg   BID Appropriate, Effective, Safe, Accessible Spironolactone 25m Appropriate, Effective, Safe, Accessible Anticoagulation:  Rivaroxaban 20mg  Appropriate, Effective, Safe, Query accessible -Medications previously tried: Irbesartan (Hypotension) -Home BP and HR readings: Not testing  -Counseled on increased risk of stroke due to Afib and benefits of anticoagulation for stroke prevention; May 2024: Patient states he has trouble paying for Rivaroxaban. States he can afford $300 once (He hits [facility] in November) but that he doesn't want to. Counseled him to get samples throughout year (I msg'd [facility] to ask for samples) and to only get 30 day supply at end of the year.   CP F/U PRN  Artelia Laroche, Pharm.D. - [phone]

## 2023-02-26 NOTE — Progress Notes (Signed)
Acute Office Visit  Subjective:    Patient ID: Ryan Mccall, male    DOB: 1945-12-11, 76 y.o.   MRN: [id number]  Chief Complaint  Patient presents with   Shoulder Pain    HPI: Patient is in today for left  shoulder pain.  His symptoms started about 5 years ago and seem to be getting worse.  Right shoulder also hurts. Denies any specific injury but had very laborious jobs. Has tried tylenol and rubs. Patient is on xarelto so unable to take nsaids. Occasionally will take an aleve or ibuprofen and it does help.   Patient is also complaining of numbness of BL hands and pain also. Worse at night. Worse when driving.   Past Medical History:  Diagnosis Date   Atrial fibrillation [facility])    Coronary artery disease    s/p remote CABG 1999 per Dr. 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   Wears dentures    full upper and lower    Past Surgical History:  Procedure Laterality Date   CATARACT EXTRACTION W/PHACO Right 01/19/2020   Procedure: CATARACT EXTRACTION PHACO AND INTRAOCULAR LENS PLACEMENT (IOC) RIGHT VISION BLUE 8.60  00:58.6;  Surgeon: Elliot Cousin, MD;  Location: [facility];  Service: Ophthalmology;  Laterality: Right;   CATARACT EXTRACTION W/PHACO Left 01/09/2021   Procedure: CATARACT EXTRACTION PHACO AND INTRAOCULAR LENS PLACEMENT (IOC) LEFT;  Surgeon: Lockie Mola, MD;  Location: [facility];  Service: Ophthalmology;  Laterality: Left;  3.30 0:51.6 6.4%   COLECTOMY WITH COLOSTOMY CREATION/HARTMANN PROCEDURE N/A 07/03/2017   Procedure: COLECTOMY WITH COLOSTOMY CREATION/HARTMANN PROCEDURE;  Surgeon: Lattie Haw, MD;  Location: [facility];  Service: General;  Laterality: N/A;   COLONOSCOPY WITH PROPOFOL N/A 10/27/2017   Procedure: COLONOSCOPY WITH PROPOFOL through colostomy;  Surgeon: Midge Minium, MD;  Location: [facility];  Service: Endoscopy;  Laterality: N/A;   COLOSTOMY CLOSURE N/A 11/17/2017   Procedure: COLOSTOMY CLOSURE;  Surgeon: Lattie Haw, MD;  Location: [facility];  Service: General;  Laterality: N/A;   CORONARY ARTERY BYPASS GRAFT  1999   4 vessels   excision of skin cancer     KNEE ARTHROSCOPY W/ MENISCAL REPAIR      Family History  Problem Relation Age of Onset   Lung disease Father 60    Social History   Socioeconomic History   Marital status: Married    Spouse name: Not on file   Number of children: 2   Years of education: Not on file   Highest education level: Not on file  Occupational History   Occupation: Therapist, art  Tobacco Use   Smoking status: Former    Types: Cigars    Quit date: 09/29/1997    Years since quitting: 25.4   Smokeless tobacco: Former    Types: Chew    Quit date: 1999  Vaping Use   Vaping Use: Never used  Substance and Sexual Activity   Alcohol use: Yes    Alcohol/week: 1.0 standard drink of alcohol    Types: 1 Shots of liquor per week    Comment: one time monthly   Drug use:  No   Sexual activity: Yes    Partners: Female  Other Topics Concern   Not on file  Social History Narrative   Not on file   Social Determinants of Health   Financial Resource Strain: Medium Risk (02/24/2023)   Overall Financial Resource Strain (CARDIA)    Difficulty of Paying Living Expenses: Somewhat hard  Food Insecurity: No Food Insecurity (02/08/2021)   Hunger Vital Sign    Worried About Running Out of Food in the Last Year: Never true    Ran Out of Food in the Last Year: Never true  Transportation Needs: No Transportation Needs (02/24/2023)   PRAPARE - Administrator, Civil Service (Medical): No    Lack of  Transportation (Non-Medical): No  Physical Activity: Sufficiently Active (02/08/2021)   Exercise Vital Sign    Days of Exercise per Week: 5 days    Minutes of Exercise per Session: 30 min  Stress: Not on file  Social Connections: Not on file  Intimate Partner Violence: Not on file    Outpatient Medications Prior to Visit  Medication Sig Dispense Refill   carvedilol (COREG) 6.25 MG tablet Take 1 tablet (6.25 mg total) by mouth 2 (two) times daily. 180 tablet 3   Cholecalciferol (VITAMIN D) 50 MCG (2000 UT) CAPS Take 1 capsule by mouth daily.     clotrimazole-betamethasone (LOTRISONE) cream Apply 2 gr in the affected area twice a day. 45 g 2   COD LIVER OIL PO Take 1 capsule by mouth daily.     diclofenac Sodium (VOLTAREN) 1 % GEL Apply 2 g topically 2 (two) times daily as needed. 1000 g 2   ezetimibe (ZETIA) 10 MG tablet Take 1 tablet (10 mg total) by mouth every other day. 90 tablet 1   feeding supplement, ENSURE ENLIVE, (ENSURE ENLIVE) LIQD Take 237 mLs by mouth 2 (two) times daily between meals. (Patient taking differently: Take 237 mLs by mouth daily.) 237 mL 12   fexofenadine (ALLEGRA) 180 MG tablet Take 180 mg by mouth daily as needed for allergies.     nitroGLYCERIN (NITROSTAT) 0.4 MG SL tablet PLACE 1 TAB UNDER TONGUE AS NEEDED FOR CHEST PAIN. MAY REPEAT IN 15 MINUTES UP TO 3 TIME-CHEST PAIN. 150 tablet 1   rivaroxaban (XARELTO) 20 MG TABS tablet Take 1 tablet (20 mg total) by mouth daily with supper. 90 tablet 3   silver sulfADIAZINE (SILVADENE) 1 % cream Apply 1 application topically daily. (Patient taking differently: Apply 1 application  topically daily as needed.) 50 g 2   simvastatin (ZOCOR) 10 MG tablet Take 1 tablet (10 mg total) by mouth every other day. 90 tablet 1   spironolactone (ALDACTONE) 25 MG tablet Take 1 tablet (25 mg total) by mouth daily. 90 tablet 3   vitamin C (ASCORBIC ACID) 500 MG tablet Take 500 mg by mouth 2 (two) times a week.     traMADol (ULTRAM) 50 MG  tablet Take 1 tablet (50 mg total) by mouth every 8 (eight) hours as needed. 90 tablet 0   No facility-administered medications prior to visit.    Allergies  Allergen Reactions   Shellfish Allergy Nausea And Vomiting    Review of Systems  Constitutional:  Negative for chills, diaphoresis, fatigue and fever.  HENT:  Negative for congestion, ear pain and sore throat.   Respiratory:  Positive for cough and shortness of breath.   Cardiovascular:  Positive for leg swelling. Negative for chest pain.  Gastrointestinal:  Negative for abdominal  pain, constipation, diarrhea, nausea and vomiting.  Genitourinary:  Negative for dysuria and urgency.  Musculoskeletal:  Positive for arthralgias (bilateral knee and shoulder pain). Negative for myalgias.  Neurological:  Positive for numbness (hands). Negative for dizziness and headaches.  Psychiatric/Behavioral:  Negative for dysphoric mood.        Objective:        02/27/2023   10:12 AM 02/10/2023   10:20 AM 01/30/2023    7:55 AM  Vitals with BMI  Height 5\' 9"  5\' 9"  5\' 9"   Weight 228 lbs 224 lbs 224 lbs  BMI 33.65 33.06 33.06  Systolic 140  134  Diastolic 70  82  Pulse 82  64    No data found.   Physical Exam Vitals reviewed.  Constitutional:      Appearance: Normal appearance. He is normal weight.  Cardiovascular:     Rate and Rhythm: Normal rate and regular rhythm.     Heart sounds: No murmur heard. Pulmonary:     Effort: Pulmonary effort is normal.     Breath sounds: Normal breath sounds.  Abdominal:     Tenderness: There is no abdominal tenderness.  Musculoskeletal:        General: Tenderness present.     Comments: RIGHT SHOULDER EXAM TENDER: anterior and posterior FROM Limited. ABDUCTION: limited ROM EXTERNAL ROTATION: limited ROM  INTERNAL ROTATION: limited ROM EMPTY CAN SIGN: positive   LEFT SHOULDER EXAM TENDER: anterior and posterior FROM ABNORMAL ABDUCTION: limited ROM EXTERNAL ROTATION: limited ROM INTERNAL  ROTATION: limited ROM  EMPTY CAN SIGN: positive   Phalen's test and Tinel's sign were negative on both wrist. pain/burning in her fingers 1-3rd fingers.     Neurological:     Mental Status: He is alert and oriented to person, place, and time.  Psychiatric:        Mood and Affect: Mood normal.        Behavior: Behavior normal.     Health Maintenance Due  Topic Date Due   Medicare Annual Wellness (AWV)  Never done   COVID-19 Vaccine (1) Never done   Zoster Vaccines- Shingrix (1 of 2) Never done   Pneumonia Vaccine 88+ Years old (1 of 1 - PCV) Never done    There are no preventive care reminders to display for this patient.   Lab Results  Component Value Date   TSH 2.920 12/17/2022   Lab Results  Component Value Date   WBC 2.8 (L) 12/17/2022   HGB 14.5 12/17/2022   HCT 38.2 12/17/2022   MCV 91 12/17/2022   PLT 153 12/17/2022   Lab Results  Component Value Date   NA 139 12/17/2022   K 4.7 12/17/2022   CO2 23 12/17/2022   GLUCOSE 103 (H) 12/17/2022   BUN 19 12/17/2022   CREATININE 0.81 12/17/2022   BILITOT 0.6 12/17/2022   ALKPHOS 65 12/17/2022   AST 35 12/17/2022   ALT 32 12/17/2022   PROT 6.7 12/17/2022   ALBUMIN 4.4 12/17/2022   CALCIUM 9.5 12/17/2022   ANIONGAP 6 11/23/2017   EGFR 92 12/17/2022   GFR 98.27 01/23/2014   Lab Results  Component Value Date   CHOL 136 12/17/2022   Lab Results  Component Value Date   HDL 44 12/17/2022   Lab Results  Component Value Date   LDLCALC 77 12/17/2022   Lab Results  Component Value Date   TRIG 75 12/17/2022   Lab Results  Component Value Date   CHOLHDL 3.1 12/17/2022   Lab  Results  Component Value Date   HGBA1C 5.4 12/17/2022       Assessment & Plan:  Impingement of left shoulder Assessment & Plan: Risks were discussed including bleeding, infection, increase in sugars if diabetic, atrophy at site of injection, and increased pain.  After consent was obtained, using sterile technique the left  shoulder was prepped with alcohol.  The joint was entered posteriorly and  Kenalog 40 mg and 5 ml plain Lidocaine was then injected and the needle withdrawn.  The procedure was well tolerated.   The patient is asked to continue to rest the joint for a few more days before resuming regular activities.  It may be more painful for the first 1-2 days.  Watch for fever, or increased swelling or persistent pain in the joint. Call or return to clinic prn if such symptoms occur or there is failure to improve as anticipated.    Longstanding persistent atrial fibrillation [facility]) Assessment & Plan: Management per specialist. On xarelto.   BMI 33.0-33.9,adult Assessment & Plan: Recommend continue to work on eating healthy diet and exercise.    Carpal tunnel syndrome, bilateral Assessment & Plan: Carpel tunnel wrist supports.  Exercises as instructed    Impingement syndrome of right shoulder Assessment & Plan: Return for arthrocentesis next week.   After patient left, I reviewed his labs to see when he was due to return for a chronic visit. His wbc was low in 11/2022 at 2.8. This needs to be repeated, although, I would recommend waiting a couple of weeks after steroid injections. I will discuss this with the patient when he returns next week for an arthrocentesis in right shoulder.       No orders of the defined types were placed in this encounter.   No orders of the defined types were placed in this encounter.    Follow-up: No follow-ups on file.  An After Visit Summary was printed and given to the patient.  Clayborn Bigness I Leal-Borjas,acting as a scribe for Blane Ohara, MD.,have documented all relevant documentation on the behalf of Blane Ohara, MD,as directed by  Blane Ohara, MD while in the presence of Blane Ohara, MD.   Blane Ohara, MD [facility] [phone]

## 2023-02-27 ENCOUNTER — Ambulatory Visit (INDEPENDENT_AMBULATORY_CARE_PROVIDER_SITE_OTHER): Payer: PPO | Admitting: Family Medicine

## 2023-02-27 ENCOUNTER — Encounter: Payer: Self-pay | Admitting: Family Medicine

## 2023-02-27 VITALS — BP 140/70 | HR 82 | Temp 97.7°F | Resp 16 | Ht 69.0 in | Wt 228.0 lb

## 2023-02-27 DIAGNOSIS — M7542 Impingement syndrome of left shoulder: Secondary | ICD-10-CM | POA: Diagnosis not present

## 2023-02-27 DIAGNOSIS — I4811 Longstanding persistent atrial fibrillation: Secondary | ICD-10-CM | POA: Diagnosis not present

## 2023-02-27 DIAGNOSIS — G5603 Carpal tunnel syndrome, bilateral upper limbs: Secondary | ICD-10-CM | POA: Diagnosis not present

## 2023-02-27 DIAGNOSIS — M25812 Other specified joint disorders, left shoulder: Secondary | ICD-10-CM

## 2023-02-27 DIAGNOSIS — Z6833 Body mass index (BMI) 33.0-33.9, adult: Secondary | ICD-10-CM

## 2023-02-27 DIAGNOSIS — M7541 Impingement syndrome of right shoulder: Secondary | ICD-10-CM

## 2023-02-27 MED ORDER — TRIAMCINOLONE ACETONIDE 40 MG/ML IJ SUSP
40.0000 mg | Freq: Once | INTRAMUSCULAR | Status: DC
Start: 2023-02-27 — End: 2023-06-18

## 2023-02-27 NOTE — Assessment & Plan Note (Signed)
Recommend continue to work on eating healthy diet and exercise.  

## 2023-02-27 NOTE — Patient Instructions (Signed)
Carpel tunnel wrist supports.  Exercises as instructed

## 2023-02-27 NOTE — Assessment & Plan Note (Addendum)
Risks were discussed including bleeding, infection, increase in sugars if diabetic, atrophy at site of injection, and increased pain.  After consent was obtained, using sterile technique the left shoulder was prepped with alcohol.  The joint was entered posteriorly and  Kenalog 40 mg and 5 ml plain Lidocaine was then injected and the needle withdrawn.  The procedure was well tolerated.   The patient is asked to continue to rest the joint for a few more days before resuming regular activities.  It may be more painful for the first 1-2 days.  Watch for fever, or increased swelling or persistent pain in the joint. Call or return to clinic prn if such symptoms occur or there is failure to improve as anticipated.  

## 2023-02-27 NOTE — Assessment & Plan Note (Addendum)
Management per specialist. On xarelto.

## 2023-02-27 NOTE — Assessment & Plan Note (Signed)
Return for arthrocentesis next week.   After patient left, I reviewed his labs to see when he was due to return for a chronic visit. His wbc was low in 11/2022 at 2.8. This needs to be repeated, although, I would recommend waiting a couple of weeks after steroid injections. I will discuss this with the patient when he returns next week for an arthrocentesis in right shoulder.

## 2023-02-27 NOTE — Assessment & Plan Note (Signed)
Carpel tunnel wrist supports.  Exercises as instructed  

## 2023-02-27 NOTE — Addendum Note (Signed)
Addended byBlane Ohara on: 02/27/2023 10:39 PM   Modules accepted: Orders

## 2023-03-04 NOTE — Assessment & Plan Note (Signed)
Well controlled Continue Coreg 6.25 mg BD and Spironolactone 25 mg daily Labs will be drawn today

## 2023-03-04 NOTE — Progress Notes (Deleted)
Subjective:  Patient ID: Ryan Mccall, male    DOB: March 11, 1946  Age: 77 y.o. MRN: [id number]  No chief complaint on file.   HPI   Patient is here for a regular follow up for his HYPERTENSION, Hyperlipidemia, Atrial fibrillation. He is watching his diet and regularly monitoring his BP and weight at home. He said he is trying his best eating healthy and regular exercise.   For his HYPERTENSION: He was last seen for hypertension 6 months ago.  BP at that visit was 110/72. Management includes Carvedilol 6.25 mg twice a day, spironolactone 12.5 mg daily .  He reports good compliance with treatment. He is not having side effects.  He is following a Regular diet. He is exercising: walking He does not smoke.  Use of agents associated with hypertension: none.   Outside blood pressures are normal: 100-130's/60-80's  Lipid/Cholesterol, Follow-up  He was last seen for this 6 months ago.  Management includes simvastatin 10 mg OD and Zetia 10 mg daily.  He reports good compliance with treatment. He is not having side effects.   For Atrial Fibrillation:  He is taking Xarelto 20 mg daily with Supper, he follows with Dr [name] [place]     02/27/2023   10:15 AM 12/17/2022    7:30 AM 12/12/2021    7:29 AM 06/14/2021    7:35 AM 02/08/2021   10:08 AM  Depression screen PHQ 2/9  Decreased Interest 0 0 0 0 0  Down, Depressed, Hopeless 0 0 0 0 0  PHQ - 2 Score 0 0 0 0 0  Altered sleeping 0      Tired, decreased energy 1      Change in appetite 0      Feeling bad or failure about yourself  0      Trouble concentrating 0      Moving slowly or fidgety/restless 0      Suicidal thoughts 0      PHQ-9 Score 1      Difficult doing work/chores Not difficult at all            02/27/2023   10:14 AM  Fall Risk   Falls in the past year? 1  Number falls in past yr: 0  Injury with Fall? 0  Risk for fall due to : No Fall Risks  Follow up Falls evaluation completed;Falls prevention discussed     Patient Care Team: Blane Ohara, MD as PCP - General (Family Medicine) [place], [name] M, MD as PCP - Cardiology (Cardiology)   Review of Systems  Current Outpatient Medications on File Prior to Visit  Medication Sig Dispense Refill   carvedilol (COREG) 6.25 MG tablet Take 1 tablet (6.25 mg total) by mouth 2 (two) times daily. 180 tablet 3   Cholecalciferol (VITAMIN D) 50 MCG (2000 UT) CAPS Take 1 capsule by mouth daily.     clotrimazole-betamethasone (LOTRISONE) cream Apply 2 gr in the affected area twice a day. 45 g 2   COD LIVER OIL PO Take 1 capsule by mouth daily.     diclofenac Sodium (VOLTAREN) 1 % GEL Apply 2 g topically 2 (two) times daily as needed. 1000 g 2   ezetimibe (ZETIA) 10 MG tablet Take 1 tablet (10 mg total) by mouth every other day. 90 tablet 1   feeding supplement, ENSURE ENLIVE, (ENSURE ENLIVE) LIQD Take 237 mLs by mouth 2 (two) times daily between meals. (Patient taking differently: Take 237 mLs by mouth daily.) 237 mL 12  fexofenadine (ALLEGRA) 180 MG tablet Take 180 mg by mouth daily as needed for allergies.     nitroGLYCERIN (NITROSTAT) 0.4 MG SL tablet PLACE 1 TAB UNDER TONGUE AS NEEDED FOR CHEST PAIN. MAY REPEAT IN 15 MINUTES UP TO 3 TIME-CHEST PAIN. 150 tablet 1   rivaroxaban (XARELTO) 20 MG TABS tablet Take 1 tablet (20 mg total) by mouth daily with supper. 90 tablet 3   silver sulfADIAZINE (SILVADENE) 1 % cream Apply 1 application topically daily. (Patient taking differently: Apply 1 application  topically daily as needed.) 50 g 2   simvastatin (ZOCOR) 10 MG tablet Take 1 tablet (10 mg total) by mouth every other day. 90 tablet 1   spironolactone (ALDACTONE) 25 MG tablet Take 1 tablet (25 mg total) by mouth daily. 90 tablet 3   vitamin C (ASCORBIC ACID) 500 MG tablet Take 500 mg by mouth 2 (two) times a week.     Current Facility-Administered Medications on File Prior to Visit  Medication Dose Route Frequency Provider Last Rate Last Admin   triamcinolone  acetonide (KENALOG-40) injection 40 mg  40 mg Intra-articular Once Cox, Fritzi Mandes, MD       Past Medical History:  Diagnosis Date   Atrial fibrillation [facility])    Coronary artery disease    s/p remote CABG 1999 per Dr. 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   Wears dentures    full upper and lower   Past Surgical History:  Procedure Laterality Date   CATARACT EXTRACTION W/PHACO Right 01/19/2020   Procedure: CATARACT EXTRACTION PHACO AND INTRAOCULAR LENS PLACEMENT (IOC) RIGHT VISION BLUE 8.60  00:58.6;  Surgeon: Elliot Cousin, MD;  Location: [facility];  Service: Ophthalmology;  Laterality: Right;   CATARACT EXTRACTION W/PHACO Left 01/09/2021   Procedure: CATARACT EXTRACTION PHACO AND INTRAOCULAR LENS PLACEMENT (IOC) LEFT;  Surgeon: Lockie Mola, MD;  Location: [facility];  Service: Ophthalmology;  Laterality: Left;  3.30 0:51.6 6.4%   COLECTOMY WITH COLOSTOMY CREATION/HARTMANN PROCEDURE N/A 07/03/2017   Procedure: COLECTOMY WITH COLOSTOMY CREATION/HARTMANN PROCEDURE;  Surgeon: Lattie Haw, MD;  Location: [facility] OR[facility];  Service: General;  Laterality: N/A;   COLONOSCOPY WITH PROPOFOL N/A 10/27/2017   Procedure: COLONOSCOPY WITH PROPOFOL through colostomy;  Surgeon: Midge Minium, MD;  Location: [facility];  Service: Endoscopy;  Laterality: N/A;   COLOSTOMY CLOSURE N/A 11/17/2017   Procedure: COLOSTOMY CLOSURE;  Surgeon: Lattie Haw, MD;  Location: [facility] OR[facility];  Service: General;  Laterality: N/A;   CORONARY ARTERY BYPASS GRAFT  1999   4 vessels   excision of skin cancer     KNEE ARTHROSCOPY W/ MENISCAL REPAIR       Family History  Problem Relation Age of Onset   Lung disease Father 67   Social History   Socioeconomic History   Marital status: Married    Spouse name: Not on file   Number of children: 2   Years of education: Not on file   Highest education level: Not on file  Occupational History   Occupation: Therapist, art  Tobacco Use   Smoking status: Former    Types: Medical illustrator  date: 09/29/1997    Years since quitting: 25.4   Smokeless tobacco: Former    Types: Chew    Quit date: 1999  Vaping Use   Vaping Use: Never used  Substance and Sexual Activity   Alcohol use: Yes    Alcohol/week: 1.0 standard drink of alcohol    Types: 1 Shots of liquor per week    Comment: one time monthly   Drug use: No   Sexual activity: Yes    Partners: Female  Other Topics Concern   Not on file  Social History Narrative   Not on file   Social Determinants of Health   Financial Resource Strain: Medium Risk (02/24/2023)   Overall Financial Resource Strain (CARDIA)    Difficulty of Paying Living Expenses: Somewhat hard  Food Insecurity: No Food Insecurity (02/08/2021)   Hunger Vital Sign    Worried About Running Out of Food in the Last Year: Never true    Ran Out of Food in the Last Year: Never true  Transportation Needs: No Transportation Needs (02/24/2023)   PRAPARE - Administrator, Civil Service (Medical): No    Lack of Transportation (Non-Medical): No  Physical Activity: Sufficiently Active (02/08/2021)   Exercise Vital Sign    Days of Exercise per Week: 5 days    Minutes of Exercise per Session: 30 min  Stress: Not on file  Social Connections: Not on file    Objective:  There were no vitals taken for this visit.     02/27/2023   10:12 AM 02/10/2023   10:20 AM 01/30/2023    7:55 AM  BP/Weight  Systolic BP 140  657  Diastolic BP 70  82  Wt. (Lbs) 228 224 224  BMI 33.67 kg/m2 33.08 kg/m2 33.08 kg/m2    Physical Exam  Diabetic Foot Exam - Simple   No  data filed      Lab Results  Component Value Date   WBC 2.8 (L) 12/17/2022   HGB 14.5 12/17/2022   HCT 38.2 12/17/2022   PLT 153 12/17/2022   GLUCOSE 103 (H) 12/17/2022   CHOL 136 12/17/2022   TRIG 75 12/17/2022   HDL 44 12/17/2022   LDLCALC 77 12/17/2022   ALT 32 12/17/2022   AST 35 12/17/2022   NA 139 12/17/2022   K 4.7 12/17/2022   CL 102 12/17/2022   CREATININE 0.81 12/17/2022   BUN 19 12/17/2022   CO2 23 12/17/2022   TSH 2.920 12/17/2022   INR 1.2 12/17/2022   HGBA1C 5.4 12/17/2022      Assessment & Plan:    Primary hypertension Assessment & Plan: Well controlled Continue Coreg 6.25 mg BD and Spironolactone 25 mg daily Labs will be drawn today     Longstanding persistent atrial fibrillation [facility]) Assessment & Plan: Management per specialist. On xarelto.   Hypercholesterolemia Assessment & Plan: Well controlled.  No changes to medicines. Zetia 10 mg daily, simvastatin 10 mg daily Continue to work on eating a healthy diet and exercise.  Labs drawn today.        No orders of the defined types were placed in this encounter.   No orders of the defined types were placed in this encounter.    Follow-up: No follow-ups on file.   Clayborn Bigness I Leal-Borjas,acting as a scribe for Blane Ohara, MD.,have documented all relevant documentation on the behalf of Blane Ohara, MD,as directed by  Blane Ohara, MD while in the presence of Blane Ohara, MD.   An After  Visit Summary was printed and given to the patient.  Blane Ohara, MD [facility] [phone]

## 2023-03-04 NOTE — Assessment & Plan Note (Signed)
Management per specialist. On xarelto.

## 2023-03-04 NOTE — Assessment & Plan Note (Signed)
Well controlled.  No changes to medicines. Zetia 10 mg daily, simvastatin 10 mg daily Continue to work on eating a healthy diet and exercise.  Labs drawn today.

## 2023-03-05 ENCOUNTER — Ambulatory Visit (INDEPENDENT_AMBULATORY_CARE_PROVIDER_SITE_OTHER): Payer: PPO | Admitting: Family Medicine

## 2023-03-05 VITALS — BP 110/70 | HR 60 | Temp 97.1°F | Resp 18 | Ht 69.0 in | Wt 222.4 lb

## 2023-03-05 DIAGNOSIS — M7541 Impingement syndrome of right shoulder: Secondary | ICD-10-CM

## 2023-03-05 NOTE — Progress Notes (Unsigned)
Acute Office Visit  Subjective:    Patient ID: Ryan Mccall, male    DOB: [birth year], 77 y.o.   MRN: [id number]  Chief Complaint  Patient presents with   Shoulder Pain    Right     HPI: Patient is in today for right shoulder pain.  He comes in requesting a steroid injection.  Past Medical History:  Diagnosis Date   Atrial fibrillation [facility])    Coronary artery disease    s/p remote CABG 1999 per Dr. 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   Wears dentures    full upper and lower    Past Surgical History:  Procedure Laterality Date   CATARACT EXTRACTION W/PHACO Right 01/19/2020   Procedure: CATARACT EXTRACTION PHACO AND INTRAOCULAR LENS PLACEMENT (IOC) RIGHT VISION BLUE 8.60  00:58.6;  Surgeon: Elliot Cousin, MD;  Location: [facility];  Service: Ophthalmology;  Laterality: Right;   CATARACT EXTRACTION W/PHACO Left 01/09/2021   Procedure: CATARACT EXTRACTION PHACO AND INTRAOCULAR LENS PLACEMENT (IOC) LEFT;  Surgeon: Lockie Mola, MD;  Location: [facility];  Service: Ophthalmology;  Laterality: Left;  3.30 0:51.6 6.4%   COLECTOMY WITH COLOSTOMY CREATION/HARTMANN PROCEDURE N/A 07/03/2017   Procedure: COLECTOMY WITH COLOSTOMY CREATION/HARTMANN PROCEDURE;  Surgeon: Lattie Haw, MD;  Location: [facility];  Service: General;  Laterality: N/A;   COLONOSCOPY WITH PROPOFOL N/A 10/27/2017   Procedure: COLONOSCOPY WITH PROPOFOL through colostomy;  Surgeon: Midge Minium, MD;  Location: [facility];  Service: Endoscopy;  Laterality: N/A;   COLOSTOMY CLOSURE N/A 11/17/2017   Procedure: COLOSTOMY CLOSURE;  Surgeon:  Lattie Haw, MD;  Location: [facility] OR[facility];  Service: General;  Laterality: N/A;   CORONARY ARTERY BYPASS GRAFT  1999   4 vessels   excision of skin cancer     KNEE ARTHROSCOPY W/ MENISCAL REPAIR      Family History  Problem Relation Age of Onset   Lung disease Father 19    Social History   Socioeconomic History   Marital status: Married    Spouse name: Not on file   Number of children: 2   Years of education: Not on file   Highest education level: Not on file  Occupational History   Occupation: Therapist, art  Tobacco Use   Smoking status: Former    Types: Cigars    Quit date: 09/29/1997    Years since quitting: 25.4   Smokeless tobacco: Former    Types: Chew    Quit date: 1999  Vaping Use   Vaping Use: Never used  Substance and Sexual Activity   Alcohol use: Yes    Alcohol/week: 1.0 standard drink of alcohol    Types: 1 Shots of liquor per week    Comment: one time monthly   Drug use: No   Sexual activity: Yes    Partners: Female  Other Topics Concern   Not on file  Social History Narrative   Not on file   Social Determinants of Health   Financial Resource Strain: Medium Risk (02/24/2023)   Overall Financial Resource Strain (CARDIA)    Difficulty of Paying Living Expenses: Somewhat hard  Food  Insecurity: No Food Insecurity (02/08/2021)   Hunger Vital Sign    Worried About Running Out of Food in the Last Year: Never true    Ran Out of Food in the Last Year: Never true  Transportation Needs: No Transportation Needs (02/24/2023)   PRAPARE - Administrator, Civil Service (Medical): No    Lack of Transportation (Non-Medical): No  Physical Activity: Sufficiently Active (02/08/2021)   Exercise Vital Sign    Days of Exercise per Week: 5 days    Minutes of Exercise per Session: 30 min  Stress: Not on file  Social Connections: Not on file  Intimate Partner Violence: Not on file    Outpatient Medications Prior to Visit  Medication Sig  Dispense Refill   carvedilol (COREG) 6.25 MG tablet Take 1 tablet (6.25 mg total) by mouth 2 (two) times daily. 180 tablet 3   Cholecalciferol (VITAMIN D) 50 MCG (2000 UT) CAPS Take 1 capsule by mouth daily.     clotrimazole-betamethasone (LOTRISONE) cream Apply 2 gr in the affected area twice a day. 45 g 2   COD LIVER OIL PO Take 1 capsule by mouth daily.     diclofenac Sodium (VOLTAREN) 1 % GEL Apply 2 g topically 2 (two) times daily as needed. 1000 g 2   ezetimibe (ZETIA) 10 MG tablet Take 1 tablet (10 mg total) by mouth every other day. 90 tablet 1   feeding supplement, ENSURE ENLIVE, (ENSURE ENLIVE) LIQD Take 237 mLs by mouth 2 (two) times daily between meals. (Patient taking differently: Take 237 mLs by mouth daily.) 237 mL 12   fexofenadine (ALLEGRA) 180 MG tablet Take 180 mg by mouth daily as needed for allergies.     nitroGLYCERIN (NITROSTAT) 0.4 MG SL tablet PLACE 1 TAB UNDER TONGUE AS NEEDED FOR CHEST PAIN. MAY REPEAT IN 15 MINUTES UP TO 3 TIME-CHEST PAIN. 150 tablet 1   rivaroxaban (XARELTO) 20 MG TABS tablet Take 1 tablet (20 mg total) by mouth daily with supper. 90 tablet 3   silver sulfADIAZINE (SILVADENE) 1 % cream Apply 1 application topically daily. (Patient taking differently: Apply 1 application  topically daily as needed.) 50 g 2   simvastatin (ZOCOR) 10 MG tablet Take 1 tablet (10 mg total) by mouth every other day. 90 tablet 1   spironolactone (ALDACTONE) 25 MG tablet Take 1 tablet (25 mg total) by mouth daily. 90 tablet 3   vitamin C (ASCORBIC ACID) 500 MG tablet Take 500 mg by mouth 2 (two) times a week.     Facility-Administered Medications Prior to Visit  Medication Dose Route Frequency Provider Last Rate Last Admin   triamcinolone acetonide (KENALOG-40) injection 40 mg  40 mg Intra-articular Once Lataya Varnell, MD        Allergies  Allergen Reactions   Shellfish Allergy Nausea And Vomiting    Review of Systems  Constitutional:  Negative for chills and fever.   HENT:  Positive for rhinorrhea. Negative for congestion and sore throat.   Respiratory:  Negative for cough and shortness of breath.   Cardiovascular:  Negative for chest pain and palpitations.  Gastrointestinal:  Negative for abdominal pain, constipation, diarrhea, nausea and vomiting.  Genitourinary:  Negative for dysuria and urgency.  Musculoskeletal:  Negative for arthralgias, back pain and myalgias.  Neurological:  Negative for dizziness and headaches.  Psychiatric/Behavioral:  Negative for dysphoric mood. The patient is not nervous/anxious.        Objective:        03/05/2023  7:43 AM 02/27/2023   10:12 AM 02/10/2023   10:20 AM  Vitals with BMI  Height 5\' 9"  5\' 9"  5\' 9"   Weight 222 lbs 6 oz 228 lbs 224 lbs  BMI 32.83 33.65 33.06  Systolic 110 140   Diastolic 70 70   Pulse 60 82     No data found.   Physical Exam  Health Maintenance Due  Topic Date Due   Medicare Annual Wellness (AWV)  Never done   COVID-19 Vaccine (1) Never done   Zoster Vaccines- Shingrix (1 of 2) Never done   Pneumonia Vaccine 37+ Years old (1 of 1 - PCV) Never done    There are no preventive care reminders to display for this patient.   Lab Results  Component Value Date   TSH 2.920 12/17/2022   Lab Results  Component Value Date   WBC 2.8 (L) 12/17/2022   HGB 14.5 12/17/2022   HCT 38.2 12/17/2022   MCV 91 12/17/2022   PLT 153 12/17/2022   Lab Results  Component Value Date   NA 139 12/17/2022   K 4.7 12/17/2022   CO2 23 12/17/2022   GLUCOSE 103 (H) 12/17/2022   BUN 19 12/17/2022   CREATININE 0.81 12/17/2022   BILITOT 0.6 12/17/2022   ALKPHOS 65 12/17/2022   AST 35 12/17/2022   ALT 32 12/17/2022   PROT 6.7 12/17/2022   ALBUMIN 4.4 12/17/2022   CALCIUM 9.5 12/17/2022   ANIONGAP 6 11/23/2017   EGFR 92 12/17/2022   GFR 98.27 01/23/2014   Lab Results  Component Value Date   CHOL 136 12/17/2022   Lab Results  Component Value Date   HDL 44 12/17/2022   Lab Results   Component Value Date   LDLCALC 77 12/17/2022   Lab Results  Component Value Date   TRIG 75 12/17/2022   Lab Results  Component Value Date   CHOLHDL 3.1 12/17/2022   Lab Results  Component Value Date   HGBA1C 5.4 12/17/2022       Assessment & Plan:  Impingement syndrome of right shoulder Assessment & Plan: Risks were discussed including bleeding, infection, increase in sugars if diabetic, atrophy at site of injection, and increased pain.  After consent was obtained, using sterile technique the  shoulder was prepped with alcohol.  The joint was entered posteriorly and  Kenalog 40 mg and 5 ml plain Lidocaine was then injected and the needle withdrawn.  The procedure was well tolerated.   The patient is asked to continue to rest the joint for a few more days before resuming regular activities.  It may be more painful for the first 1-2 days.  Watch for fever, or increased swelling or persistent pain in the joint. Call or return to clinic prn if such symptoms occur or there is failure to improve as anticipated.       No orders of the defined types were placed in this encounter.   No orders of the defined types were placed in this encounter.    Follow-up: No follow-ups on file.  An After Visit Summary was printed and given to the patient.   Clayborn Bigness I Leal-Borjas,acting as a scribe for Blane Ohara, MD.,have documented all relevant documentation on the behalf of Blane Ohara, MD,as directed by  Blane Ohara, MD while in the presence of Blane Ohara, MD.    Blane Ohara, MD [facility] [phone]

## 2023-03-07 NOTE — Assessment & Plan Note (Signed)
Risks were discussed including bleeding, infection, increase in sugars if diabetic, atrophy at site of injection, and increased pain.  After consent was obtained, using sterile technique the  shoulder was prepped with alcohol.  The joint was entered posteriorly and  Kenalog 40 mg and 5 ml plain Lidocaine was then injected and the needle withdrawn.  The procedure was well tolerated.   The patient is asked to continue to rest the joint for a few more days before resuming regular activities.  It may be more painful for the first 1-2 days.  Watch for fever, or increased swelling or persistent pain in the joint. Call or return to clinic prn if such symptoms occur or there is failure to improve as anticipated. 

## 2023-03-11 ENCOUNTER — Encounter: Payer: Self-pay | Admitting: Family Medicine

## 2023-03-11 MED ORDER — TRIAMCINOLONE ACETONIDE 40 MG/ML IJ SUSP
40.0000 mg | Freq: Once | INTRAMUSCULAR | Status: DC
Start: 2023-03-11 — End: 2023-06-18

## 2023-06-16 NOTE — Progress Notes (Unsigned)
Subjective:  Patient ID: Ryan Mccall, male    DOB: 11-Nov-1945  Age: 77 y.o. MRN: [id number]  No chief complaint on file.   HPI   Hypertension: Taking Carvedilol 6.25 mg take 1 tablet twice daily, Spironolactone 25 mg take 1 tablet daily.  Hyperlipidemia: Taking Simvastatin 10 mg 1 tablet daily, Zetia 10 mg 1 tablet daily, COD liver oil 1 capsule daily.  Long-standing persistent A-Fib: Xarelto 20 mg 1 tablet daily.       02/27/2023   10:15 AM 12/17/2022    7:30 AM 12/12/2021    7:29 AM 06/14/2021    7:35 AM 02/08/2021   10:08 AM  Depression screen PHQ 2/9  Decreased Interest 0 0 0 0 0  Down, Depressed, Hopeless 0 0 0 0 0  PHQ - 2 Score 0 0 0 0 0  Altered sleeping 0      Tired, decreased energy 1      Change in appetite 0      Feeling bad or failure about yourself  0      Trouble concentrating 0      Moving slowly or fidgety/restless 0      Suicidal thoughts 0      PHQ-9 Score 1      Difficult doing work/chores Not difficult at all            02/27/2023   10:14 AM  Fall Risk   Falls in the past year? 1  Number falls in past yr: 0  Injury with Fall? 0  Risk for fall due to : No Fall Risks  Follow up Falls evaluation completed;Falls prevention discussed    Patient Care Team: Blane Ohara, MD as PCP - General (Family Medicine) [place], [name] M, MD as PCP - Cardiology (Cardiology)   Review of Systems  Constitutional:  Negative for appetite change, fatigue and fever.  HENT:  Negative for congestion, ear pain, sinus pressure and sore throat.   Respiratory:  Negative for cough, chest tightness, shortness of breath and wheezing.   Cardiovascular:  Negative for chest pain and palpitations.  Gastrointestinal:  Negative for abdominal pain, constipation, diarrhea, nausea and vomiting.  Genitourinary:  Negative for dysuria and hematuria.  Musculoskeletal:  Negative for arthralgias, back pain, joint swelling and myalgias.  Skin:  Negative for rash.  Neurological:  Negative  for dizziness, weakness and headaches.  Psychiatric/Behavioral:  Negative for dysphoric mood. The patient is not nervous/anxious.     Current Outpatient Medications on File Prior to Visit  Medication Sig Dispense Refill   carvedilol (COREG) 6.25 MG tablet Take 1 tablet (6.25 mg total) by mouth 2 (two) times daily. 180 tablet 3   Cholecalciferol (VITAMIN D) 50 MCG (2000 UT) CAPS Take 1 capsule by mouth daily.     clotrimazole-betamethasone (LOTRISONE) cream Apply 2 gr in the affected area twice a day. 45 g 2   COD LIVER OIL PO Take 1 capsule by mouth daily.     diclofenac Sodium (VOLTAREN) 1 % GEL Apply 2 g topically 2 (two) times daily as needed. 1000 g 2   ezetimibe (ZETIA) 10 MG tablet Take 1 tablet (10 mg total) by mouth every other day. 90 tablet 1   feeding supplement, ENSURE ENLIVE, (ENSURE ENLIVE) LIQD Take 237 mLs by mouth 2 (two) times daily between meals. (Patient taking differently: Take 237 mLs by mouth daily.) 237 mL 12   fexofenadine (ALLEGRA) 180 MG tablet Take 180 mg by mouth daily as needed for allergies.  nitroGLYCERIN (NITROSTAT) 0.4 MG SL tablet PLACE 1 TAB UNDER TONGUE AS NEEDED FOR CHEST PAIN. MAY REPEAT IN 15 MINUTES UP TO 3 TIME-CHEST PAIN. 150 tablet 1   rivaroxaban (XARELTO) 20 MG TABS tablet Take 1 tablet (20 mg total) by mouth daily with supper. 90 tablet 3   silver sulfADIAZINE (SILVADENE) 1 % cream Apply 1 application topically daily. (Patient taking differently: Apply 1 application  topically daily as needed.) 50 g 2   simvastatin (ZOCOR) 10 MG tablet Take 1 tablet (10 mg total) by mouth every other day. 90 tablet 1   spironolactone (ALDACTONE) 25 MG tablet Take 1 tablet (25 mg total) by mouth daily. 90 tablet 3   vitamin C (ASCORBIC ACID) 500 MG tablet Take 500 mg by mouth 2 (two) times a week.     Current Facility-Administered Medications on File Prior to Visit  Medication Dose Route Frequency Provider Last Rate Last Admin   triamcinolone acetonide  (KENALOG-40) injection 40 mg  40 mg Intra-articular Once Cox, Kirsten, MD       triamcinolone acetonide (KENALOG-40) injection 40 mg  40 mg Intra-articular Once Cox, Fritzi Mandes, MD       Past Medical History:  Diagnosis Date   Atrial fibrillation [facility])    Coronary artery disease    s/p remote CABG 1999 per Dr. 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   Wears dentures    full upper and lower   Past Surgical History:  Procedure Laterality Date   CATARACT EXTRACTION W/PHACO Right 01/19/2020   Procedure: CATARACT EXTRACTION PHACO AND INTRAOCULAR LENS PLACEMENT (IOC) RIGHT VISION BLUE 8.60  00:58.6;  Surgeon: Elliot Cousin, MD;  Location: [facility];  Service: Ophthalmology;  Laterality: Right;   CATARACT EXTRACTION W/PHACO Left 01/09/2021   Procedure: CATARACT EXTRACTION PHACO AND INTRAOCULAR LENS PLACEMENT (IOC) LEFT;  Surgeon: Lockie Mola, MD;  Location: [facility];  Service: Ophthalmology;  Laterality: Left;  3.30 0:51.6 6.4%   COLECTOMY WITH COLOSTOMY CREATION/HARTMANN PROCEDURE N/A 07/03/2017   Procedure: COLECTOMY WITH COLOSTOMY CREATION/HARTMANN PROCEDURE;  Surgeon: Lattie Haw, MD;  Location: [facility];  Service: General;  Laterality: N/A;   COLONOSCOPY WITH PROPOFOL N/A 10/27/2017   Procedure: COLONOSCOPY WITH PROPOFOL through colostomy;  Surgeon: Midge Minium, MD;  Location: [facility];  Service: Endoscopy;  Laterality: N/A;   COLOSTOMY CLOSURE N/A 11/17/2017   Procedure: COLOSTOMY CLOSURE;  Surgeon: Lattie Haw, MD;  Location: [facility];  Service: General;  Laterality: N/A;   CORONARY ARTERY BYPASS GRAFT   1999   4 vessels   excision of skin cancer     KNEE ARTHROSCOPY W/ MENISCAL REPAIR      Family History  Problem Relation Age of Onset   Lung disease Father 38   Social History   Socioeconomic History   Marital status: Married    Spouse name: Not on file   Number of children: 2   Years of education: Not on file   Highest education level: Not on file  Occupational History   Occupation: Therapist, art  Tobacco Use   Smoking status: Former    Types: Software engineer  Quit date: 09/29/1997    Years since quitting: 25.7   Smokeless tobacco: Former    Types: Chew    Quit date: 1999  Vaping Use   Vaping status: Never Used  Substance and Sexual Activity   Alcohol use: Yes    Alcohol/week: 1.0 standard drink of alcohol    Types: 1 Shots of liquor per week    Comment: one time monthly   Drug use: No   Sexual activity: Yes    Partners: Female  Other Topics Concern   Not on file  Social History Narrative   Not on file   Social Determinants of Health   Financial Resource Strain: Medium Risk (02/24/2023)   Overall Financial Resource Strain (CARDIA)    Difficulty of Paying Living Expenses: Somewhat hard  Food Insecurity: No Food Insecurity (02/08/2021)   Hunger Vital Sign    Worried About Running Out of Food in the Last Year: Never true    Ran Out of Food in the Last Year: Never true  Transportation Needs: No Transportation Needs (02/24/2023)   PRAPARE - Administrator, Civil Service (Medical): No    Lack of Transportation (Non-Medical): No  Physical Activity: Sufficiently Active (02/08/2021)   Exercise Vital Sign    Days of Exercise per Week: 5 days    Minutes of Exercise per Session: 30 min  Stress: Not on file  Social Connections: Not on file    Objective:  There were no vitals taken for this visit.     03/05/2023    7:43 AM 02/27/2023   10:12 AM 02/10/2023   10:20 AM  BP/Weight  Systolic BP 110 140   Diastolic BP 70 70   Wt. (Lbs) 222.4 228 224   BMI 32.84 kg/m2 33.67 kg/m2 33.08 kg/m2    Physical Exam  Diabetic Foot Exam - Simple   No data filed      Lab Results  Component Value Date   WBC 2.8 (L) 12/17/2022   HGB 14.5 12/17/2022   HCT 38.2 12/17/2022   PLT 153 12/17/2022   GLUCOSE 103 (H) 12/17/2022   CHOL 136 12/17/2022   TRIG 75 12/17/2022   HDL 44 12/17/2022   LDLCALC 77 12/17/2022   ALT 32 12/17/2022   AST 35 12/17/2022   NA 139 12/17/2022   K 4.7 12/17/2022   CL 102 12/17/2022   CREATININE 0.81 12/17/2022   BUN 19 12/17/2022   CO2 23 12/17/2022   TSH 2.920 12/17/2022   INR 1.2 12/17/2022   HGBA1C 5.4 12/17/2022      Assessment & Plan:    Primary hypertension  Hypercholesterolemia  Longstanding persistent atrial fibrillation (HCC)     No orders of the defined types were placed in this encounter.   No orders of the defined types were placed in this encounter.    Follow-up: No follow-ups on file.   I,Lauren M Auman,acting as a Neurosurgeon for [employer], MD.,have documented all relevant documentation on the behalf of Windell Moment, MD,as directed by  Windell Moment, MD while in the presence of Windell Moment, MD.   An After Visit Summary was printed and given to the patient.  Windell Moment, MD [facility] [phone]

## 2023-06-18 ENCOUNTER — Ambulatory Visit (INDEPENDENT_AMBULATORY_CARE_PROVIDER_SITE_OTHER): Payer: PPO

## 2023-06-18 VITALS — BP 124/78 | HR 60 | Temp 97.8°F | Ht 69.0 in | Wt 220.8 lb

## 2023-06-18 DIAGNOSIS — E66811 Obesity, class 1: Secondary | ICD-10-CM

## 2023-06-18 DIAGNOSIS — E6609 Other obesity due to excess calories: Secondary | ICD-10-CM

## 2023-06-18 DIAGNOSIS — E7800 Pure hypercholesterolemia, unspecified: Secondary | ICD-10-CM

## 2023-06-18 DIAGNOSIS — Z6832 Body mass index (BMI) 32.0-32.9, adult: Secondary | ICD-10-CM

## 2023-06-18 DIAGNOSIS — I4811 Longstanding persistent atrial fibrillation: Secondary | ICD-10-CM | POA: Diagnosis not present

## 2023-06-18 DIAGNOSIS — I10 Essential (primary) hypertension: Secondary | ICD-10-CM | POA: Diagnosis not present

## 2023-06-18 LAB — COMPREHENSIVE METABOLIC PANEL
ALT: 24 IU/L (ref 0–44)
AST: 34 IU/L (ref 0–40)
Albumin: 4.5 g/dL (ref 3.8–4.8)
Alkaline Phosphatase: 55 IU/L (ref 44–121)
BUN/Creatinine Ratio: 24 (ref 10–24)
BUN: 20 mg/dL (ref 8–27)
Bilirubin Total: 0.7 mg/dL (ref 0.0–1.2)
CO2: 23 mmol/L (ref 20–29)
Calcium: 9.4 mg/dL (ref 8.6–10.2)
Chloride: 104 mmol/L (ref 96–106)
Creatinine, Ser: 0.85 mg/dL (ref 0.76–1.27)
Globulin, Total: 2.4 g/dL (ref 1.5–4.5)
Glucose: 89 mg/dL (ref 70–99)
Potassium: 4.8 mmol/L (ref 3.5–5.2)
Sodium: 142 mmol/L (ref 134–144)
Total Protein: 6.9 g/dL (ref 6.0–8.5)
eGFR: 90 mL/min/{1.73_m2} (ref >59)

## 2023-06-18 LAB — LIPID PANEL
Chol/HDL Ratio: 2.6 ratio (ref 0.0–5.0)
Cholesterol, Total: 124 mg/dL (ref 100–199)
HDL: 47 mg/dL (ref >39)
LDL Chol Calc (NIH): 62 mg/dL (ref 0–99)
Triglycerides: 76 mg/dL (ref 0–149)
VLDL Cholesterol Cal: 15 mg/dL (ref 5–40)

## 2023-06-18 LAB — HEMOGLOBIN A1C
Est. average glucose Bld gHb Est-mCnc: 105 mg/dL
Hgb A1c MFr Bld: 5.3 % (ref 4.8–5.6)

## 2023-06-18 NOTE — Patient Instructions (Signed)
No medication changes today Ordered blood work Recommend to get the Flu vaccine when possible. Return in 6 months

## 2023-06-18 NOTE — Assessment & Plan Note (Signed)
History of A-fib, currently appears to be in sinus rhythm. On carvedilol 6.25 mg twice daily for rate control On Xarelto 20 mg daily for anticoagulation. Follows up with cardiology.

## 2023-06-18 NOTE — Assessment & Plan Note (Signed)
Has hyperlipidemia, last lipid panel in March was normal. Currently on simvastatin 10 mg daily along with Zetia 10 mg daily.  Also takes cod liver oil capsules daily.  Plan: Order repeat lipid panel. -Recommend to continue to eat healthy and be more physically active.

## 2023-06-18 NOTE — Assessment & Plan Note (Signed)
Has class I obesity with BMI of 32.6. Diet and lifestyle recommendations made today. He had slightly elevated fasting blood sugar on his last blood work.  Will check hemoglobin A1c.

## 2023-06-18 NOTE — Assessment & Plan Note (Signed)
Blood pressure well-controlled with carvedilol 6.25 mg twice daily and spironolactone 25 mg daily.  Ordered blood work to check on kidney function which has been normal.

## 2023-06-19 ENCOUNTER — Ambulatory Visit: Payer: PPO | Admitting: Physician Assistant

## 2023-08-05 DIAGNOSIS — I482 Chronic atrial fibrillation, unspecified: Secondary | ICD-10-CM | POA: Diagnosis not present

## 2023-08-05 DIAGNOSIS — I10 Essential (primary) hypertension: Secondary | ICD-10-CM | POA: Diagnosis not present

## 2023-08-05 DIAGNOSIS — E7800 Pure hypercholesterolemia, unspecified: Secondary | ICD-10-CM | POA: Diagnosis not present

## 2023-08-05 DIAGNOSIS — I25708 Atherosclerosis of coronary artery bypass graft(s), unspecified, with other forms of angina pectoris: Secondary | ICD-10-CM | POA: Diagnosis not present

## 2023-08-05 DIAGNOSIS — I4811 Longstanding persistent atrial fibrillation: Secondary | ICD-10-CM | POA: Diagnosis not present

## 2023-08-05 NOTE — Progress Notes (Signed)
Ryan Mccall Date of Birth: 1946/02/20 Medical Record #[id number]  History of Present Illness: Ryan Mccall is seen back today for follow up of CAD. He has known CAD, HTN and HLD. He had remote CABG per Dr. Laneta Mccall in 1999 with LIMA to LAD, SVG to 1st DX and SVG to intermediate and OM of the LCX. He did have transient atrial fibrillation during a stress test in 2009. His last stress Myoview in July 2017showed normal perfusion. He was diagnosed with persistent atrial fibrillation in July 2014. Treated with rate control and anticoagulation.  Echocardiogram showed normal LV function with mild mitral insufficiency and moderate left atrial enlargement.  Previously Irbesartan was  discontinued due to low BP.   He had a normal Myoview in May 2025.    On follow up today he is doing well. His BP at home has been well controlled.   He does yard work and is working for his [employer]. He denies any chest pain, dyspnea, palpitations, no edema.  Current Outpatient Medications on File Prior to Visit  Medication Sig Dispense Refill   carvedilol (COREG) 6.25 MG tablet Take 1 tablet (6.25 mg total) by mouth 2 (two) times daily. 180 tablet 3   Cholecalciferol (VITAMIN D) 50 MCG (2000 UT) CAPS Take 1 capsule by mouth daily.     clotrimazole-betamethasone (LOTRISONE) cream Apply 2 gr in the affected area twice a day. 45 g 2   COD LIVER OIL PO Take 1 capsule by mouth daily.     diclofenac Sodium (VOLTAREN) 1 % GEL Apply 2 g topically 2 (two) times daily as needed. 1000 g 2   ezetimibe (ZETIA) 10 MG tablet Take 1 tablet (10 mg total) by mouth every other day. 90 tablet 1   feeding supplement, ENSURE ENLIVE, (ENSURE ENLIVE) LIQD Take 237 mLs by mouth 2 (two) times daily between meals. (Patient taking differently: Take 237 mLs by mouth daily.) 237 mL 12   fexofenadine (ALLEGRA) 180 MG tablet Take 180 mg by mouth daily as needed for allergies.     nitroGLYCERIN (NITROSTAT) 0.4 MG SL tablet PLACE 1 TAB  UNDER TONGUE AS NEEDED FOR CHEST PAIN. MAY REPEAT IN 15 MINUTES UP TO 3 TIME-CHEST PAIN. 150 tablet 1   rivaroxaban (XARELTO) 20 MG TABS tablet Take 1 tablet (20 mg total) by mouth daily with supper. 90 tablet 3   silver sulfADIAZINE (SILVADENE) 1 % cream Apply 1 application topically daily. (Patient taking differently: Apply 1 application  topically daily as needed.) 50 g 2   simvastatin (ZOCOR) 10 MG tablet Take 1 tablet (10 mg total) by mouth every other day. 90 tablet 1   spironolactone (ALDACTONE) 25 MG tablet Take 1 tablet (25 mg total) by mouth daily. 90 tablet 3   vitamin C (ASCORBIC ACID) 500 MG tablet Take 500 mg by mouth 2 (two) times a week.     No current facility-administered medications on file prior to visit.    Allergies  Allergen Reactions   Shellfish Allergy Nausea And Vomiting    Past Medical History:  Diagnosis Date   Atrial fibrillation (HCC)    Coronary artery disease    s/p remote CABG 1999 per Dr. 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   Wears dentures    full upper and lower    Past Surgical History:  Procedure Laterality Date   CATARACT EXTRACTION W/PHACO Right 01/19/2020   Procedure: CATARACT EXTRACTION PHACO AND INTRAOCULAR LENS PLACEMENT (IOC) RIGHT VISION BLUE 8.60  00:58.6;  Surgeon: Elliot Cousin, MD;  Location: [facility];  Service: Ophthalmology;  Laterality: Right;   CATARACT EXTRACTION W/PHACO Left 01/09/2021   Procedure: CATARACT EXTRACTION PHACO AND INTRAOCULAR LENS PLACEMENT (IOC) LEFT;  Surgeon: Lockie Mola, MD;  Location: [facility];  Service: Ophthalmology;  Laterality:  Left;  3.30 0:51.6 6.4%   COLECTOMY WITH COLOSTOMY CREATION/HARTMANN PROCEDURE N/A 07/03/2017   Procedure: COLECTOMY WITH COLOSTOMY CREATION/HARTMANN PROCEDURE;  Surgeon: Lattie Haw, MD;  Location: [facility];  Service: General;  Laterality: N/A;   COLONOSCOPY WITH PROPOFOL N/A 10/27/2017   Procedure: COLONOSCOPY WITH PROPOFOL through colostomy;  Surgeon: Midge Minium, MD;  Location: [facility];  Service: Endoscopy;  Laterality: N/A;   COLOSTOMY CLOSURE N/A 11/17/2017   Procedure: COLOSTOMY CLOSURE;  Surgeon: Lattie Haw, MD;  Location: [facility];  Service: General;  Laterality: N/A;   CORONARY ARTERY BYPASS GRAFT  1999   4 vessels   excision of skin cancer     KNEE ARTHROSCOPY W/ MENISCAL REPAIR      Social History   Tobacco Use  Smoking Status Former   Types: Cigars   Quit date: 09/29/1997   Years since quitting: 25.8  Smokeless Tobacco Former   Types: Chew   Quit date: 1999    Social History   Substance and Sexual Activity  Alcohol Use Yes   Alcohol/week: 1.0 standard drink of alcohol   Types: 1 Shots of liquor per week   Comment: one time monthly    Family History  Problem Relation Age of Onset   Lung disease Father 59    Review of Systems: The review of systems is per the HPI.  All other systems were reviewed and are negative.  Physical Exam: BP 128/86   Pulse (!) 55   Ht 5\' 8"  (1.727 m)   Wt 222 lb 9.6 oz (101 kg)   SpO2 99%   BMI 33.85 kg/m   GENERAL:  Well appearing WM in NAD HEENT:  PERRL, EOMI, sclera are clear. Oropharynx is clear. NECK:  No jugular venous distention, carotid upstroke brisk and symmetric, no bruits, no thyromegaly or adenopathy LUNGS:  Clear to auscultation bilaterally CHEST:  Unremarkable HEART:  IRRR,  PMI not displaced or sustained,S1 and S2 within normal limits, no S3, no S4: no clicks, no rubs, no murmurs ABD:  Soft, nontender. BS +, no masses or bruits. No hepatomegaly, no splenomegaly EXT:  2 + pulses throughout,  no edema, no cyanosis no clubbing SKIN:  Warm and dry.  No rashes NEURO:  Alert and oriented x 3. Cranial nerves II through XII intact. PSYCH:  Cognitively intact   LABORATORY DATA:   Lab Results  Component Value Date   WBC 4.8 08/05/2023   HGB 14.9 08/05/2023   HCT 45.5 08/05/2023   PLT 164 08/05/2023   GLUCOSE 97 08/05/2023   CHOL 133 08/05/2023   TRIG 63 08/05/2023   HDL 51 08/05/2023   LDLCALC 69 08/05/2023   ALT 26 08/05/2023   AST 38 08/05/2023   NA 140 08/05/2023   K 4.8 08/05/2023   CL 104 08/05/2023   CREATININE 0.79 08/05/2023  BUN 22 08/05/2023   CO2 29 08/05/2023   TSH 2.920 12/17/2022   INR 1.2 12/17/2022   HGBA1C 5.3 06/18/2023   Labs from 02/18/17:  CBC with platelet count 130K. Marland Kitchen Cholesterol 127, triglycerides 79, LDL 73, HDL 38. A1c 5.6%. CMET normal. Dated 10/28/17: cholesterol 126, triglycerides 91, HDL 38, LDL 66. A1c 5.3%. CBC and CMET normal. Dated 06/02/18: cholesterol 122, triglycerides 64, HDL 45, LDL 64. A1c 5.3%. CBC and CMET normal Dated 10/04/19: A1c 5.4%. cholesterol 116, triglycerides 102, HDL 41, LDL 71. Sodium 132, otherwise CBC and CMET normal. Dated 12/12/21: cholesterol 127, triglycerides 86, HDL 44, LDL 66. CBC and CMET normal  EKG Interpretation Date/Time:  Wednesday August 12 2023 08:05:32 EST Ventricular Rate:  55 PR Interval:    QRS Duration:  82 QT Interval:  446 QTC Calculation: 426 R Axis:   73  Text Interpretation: Atrial fibrillation with slow ventricular response Nonspecific ST abnormality When compared with ECG of 09-Nov-2017 09:39, No significant change was found Confirmed by [place], Hydee Fleece [phone]) on 08/12/2023 8:13:22 AM    Myoview 04/11/16: Study Highlights   Nuclear stress EF: 54%. The LV systolic function is normal There was no ST segment deviation noted during stress. The study is normal. no ischemia. No infarction This is a low risk study.     Myoview 02/10/23: Study Highlights      The study is normal. The  study is low risk.   No ST deviation was noted.   LV perfusion is normal. There is no evidence of ischemia. There is no evidence of infarction.   Left ventricular function is normal. Nuclear stress EF: 58 %. The left ventricular ejection fraction is normal (55-65%). End diastolic cavity size is normal. End systolic cavity size is normal.   Laurance Flatten, MD  Assessment / Plan: 1. CAD - remote CABG in 1999 - normal Myoview in May 2024. Patient has class 1  symptoms - stable angina. Continue risk factor modification. Continue Coreg.    2. Atrial fibrillation - permanent. Rate control is excellent on Coreg. He is asymptomatic. Continue anticoagulation with Xarelto. No bleeding. Recent labs OK.   3. HTN -  Well  Controlled on Coreg and spironolactone   4. HLD - on statin. Has been well controlled. Now  LDL up to  62-67.  On Zetia and takes Zocor every other day due to myalgias.    5. Obesity.encouraged weight loss    Plan I will followup again in 6 months

## 2023-08-06 LAB — CBC WITH DIFFERENTIAL/PLATELET
Basophils Absolute: 0.1 10*3/uL (ref 0.0–0.2)
Basos: 1 %
EOS (ABSOLUTE): 0.2 10*3/uL (ref 0.0–0.4)
Eos: 4 %
Hematocrit: 45.5 % (ref 37.5–51.0)
Hemoglobin: 14.9 g/dL (ref 13.0–17.7)
Immature Grans (Abs): 0 10*3/uL (ref 0.0–0.1)
Immature Granulocytes: 0 %
Lymphocytes Absolute: 1.5 10*3/uL (ref 0.7–3.1)
Lymphs: 32 %
MCH: 29.9 pg (ref 26.6–33.0)
MCHC: 32.7 g/dL (ref 31.5–35.7)
MCV: 91 fL (ref 79–97)
Monocytes Absolute: 0.6 10*3/uL (ref 0.1–0.9)
Monocytes: 12 %
Neutrophils Absolute: 2.5 10*3/uL (ref 1.4–7.0)
Neutrophils: 51 %
Platelets: 164 10*3/uL (ref 150–450)
RBC: 4.98 x10E6/uL (ref 4.14–5.80)
RDW: 13.3 % (ref 11.6–15.4)
WBC: 4.8 10*3/uL (ref 3.4–10.8)

## 2023-08-06 LAB — COMPREHENSIVE METABOLIC PANEL
ALT: 26 [IU]/L (ref 0–44)
AST: 38 [IU]/L (ref 0–40)
Albumin: 4.6 g/dL (ref 3.8–4.8)
Alkaline Phosphatase: 58 [IU]/L (ref 44–121)
BUN/Creatinine Ratio: 28 — ABNORMAL HIGH (ref 10–24)
BUN: 22 mg/dL (ref 8–27)
Bilirubin Total: 0.5 mg/dL (ref 0.0–1.2)
CO2: 29 mmol/L (ref 20–29)
Calcium: 9.6 mg/dL (ref 8.6–10.2)
Chloride: 104 mmol/L (ref 96–106)
Creatinine, Ser: 0.79 mg/dL (ref 0.76–1.27)
Globulin, Total: 2.3 g/dL (ref 1.5–4.5)
Glucose: 97 mg/dL (ref 70–99)
Potassium: 4.8 mmol/L (ref 3.5–5.2)
Sodium: 140 mmol/L (ref 134–144)
Total Protein: 6.9 g/dL (ref 6.0–8.5)
eGFR: 92 mL/min/{1.73_m2} (ref >59)

## 2023-08-06 LAB — LIPID PANEL
Chol/HDL Ratio: 2.6 {ratio} (ref 0.0–5.0)
Cholesterol, Total: 133 mg/dL (ref 100–199)
HDL: 51 mg/dL (ref >39)
LDL Chol Calc (NIH): 69 mg/dL (ref 0–99)
Triglycerides: 63 mg/dL (ref 0–149)
VLDL Cholesterol Cal: 13 mg/dL (ref 5–40)

## 2023-08-06 LAB — HEPATIC FUNCTION PANEL: Bilirubin, Direct: 0.25 mg/dL (ref 0.00–0.40)

## 2023-08-12 ENCOUNTER — Encounter: Payer: Self-pay | Admitting: Cardiology

## 2023-08-12 ENCOUNTER — Ambulatory Visit: Payer: PPO | Attending: Cardiology | Admitting: Cardiology

## 2023-08-12 VITALS — BP 128/86 | HR 55 | Ht 68.0 in | Wt 222.6 lb

## 2023-08-12 DIAGNOSIS — I25708 Atherosclerosis of coronary artery bypass graft(s), unspecified, with other forms of angina pectoris: Secondary | ICD-10-CM | POA: Diagnosis not present

## 2023-08-12 DIAGNOSIS — I10 Essential (primary) hypertension: Secondary | ICD-10-CM | POA: Diagnosis not present

## 2023-08-12 DIAGNOSIS — I4811 Longstanding persistent atrial fibrillation: Secondary | ICD-10-CM

## 2023-08-12 DIAGNOSIS — E7800 Pure hypercholesterolemia, unspecified: Secondary | ICD-10-CM | POA: Diagnosis not present

## 2023-08-12 NOTE — Patient Instructions (Signed)
Medication Instructions:  No changes continue taking medications  *If you need a refill on your cardiac medications before your next appointment, please call your pharmacy*   Lab Work: None needed for this appointment  If you have labs (blood work) drawn today and your tests are completely normal, you will receive your results only by: MyChart Message (if you have MyChart) OR A paper copy in the mail If you have any lab test that is abnormal or we need to change your treatment, we will call you to review the results.   Testing/Procedures: None needed at this time    Follow-Up: At [facility], you and your health needs are our priority.  As part of our continuing mission to provide you with exceptional heart care, we have created designated Provider Care Teams.  These Care Teams include your primary Cardiologist (physician) and Advanced Practice Providers (APPs -  Physician Assistants and Nurse Practitioners) who all work together to provide you with the care you need, when you need it.    Your next appointment:   6 month(s)  Provider:   [name] [place], MD

## 2023-08-13 ENCOUNTER — Ambulatory Visit: Payer: PPO

## 2023-08-13 VITALS — BP 118/66 | HR 67 | Temp 97.6°F | Resp 16 | Ht 68.0 in | Wt 226.4 lb

## 2023-08-13 DIAGNOSIS — M79644 Pain in right finger(s): Secondary | ICD-10-CM | POA: Diagnosis not present

## 2023-08-13 DIAGNOSIS — M79641 Pain in right hand: Secondary | ICD-10-CM | POA: Diagnosis not present

## 2023-08-13 MED ORDER — METHOCARBAMOL 750 MG PO TABS: 750.0000 mg | ORAL_TABLET | Freq: Every evening | ORAL | 0 refills | Status: AC | PRN

## 2023-08-13 NOTE — Progress Notes (Signed)
Acute Office Visit  Subjective:    Patient ID: Ryan Mccall, male    DOB: 1946/09/14, 77 y.o.   MRN: [id number]  Chief Complaint  Patient presents with   Arthritis    HPI: Patient is in today for a problem visit. Patient presents with right hand pain, He feels is from arthiritis. Pain started about two weeks ago.  The patient, with a history of hypertension, presented with a two-week history of right hand pain, specifically in the thumb. The pain was described as a sensation of the thumb "popping off" and was associated with numbness. The patient reported that the pain was worse at night, often waking her up, and was temporarily relieved by applying pressure to the base of the thumb. The patient also reported that the pain was exacerbated by certain activities such as writing, which caused numbness and tingling in the thumb, index, and middle fingers.  The patient had been using a carpal tunnel brace at night and occasionally during the day, which provided some relief. She also reported using diclofenac gel for pain management. The patient had recently returned to work, which involved cleaning a spray gun, and she believed this activity might have contributed to the onset of the hand pain.  In addition to the hand pain, the patient reported a history of right shoulder pain, which had improved following an injection a few months prior. The patient also reported a history of diverticulitis, for which she had undergone surgery.  Past Medical History:  Diagnosis Date   Atrial fibrillation [facility])    Coronary artery disease    s/p remote CABG 1999 per Dr. Laneta Simmers;  Lexiscan Myoview (01/2014):  No ischemia, not gated, normal study   COVID-19 03/2019   Diverticulitis of colon (without mention of hemorrhage)(562.11)    Diverticulitis of large intestine with perforation without abscess 07/02/2017   Hypercholesterolemia    Hypertension    Perforation of sigmoid colon due to diverticulitis    Skin  cancer of trunk    abdominal wall squamous cell   Wears dentures    full upper and lower    Past Surgical History:  Procedure Laterality Date   CATARACT EXTRACTION W/PHACO Right 01/19/2020   Procedure: CATARACT EXTRACTION PHACO AND INTRAOCULAR LENS PLACEMENT (IOC) RIGHT VISION BLUE 8.60  00:58.6;  Surgeon: Elliot Cousin, MD;  Location: [facility];  Service: Ophthalmology;  Laterality: Right;   CATARACT EXTRACTION W/PHACO Left 01/09/2021   Procedure: CATARACT EXTRACTION PHACO AND INTRAOCULAR LENS PLACEMENT (IOC) LEFT;  Surgeon: Lockie Mola, MD;  Location: [facility];  Service: Ophthalmology;  Laterality: Left;  3.30 0:51.6 6.4%   COLECTOMY WITH COLOSTOMY CREATION/HARTMANN PROCEDURE N/A 07/03/2017   Procedure: COLECTOMY WITH COLOSTOMY CREATION/HARTMANN PROCEDURE;  Surgeon: Lattie Haw, MD;  Location: [facility] ORS;  Service: General;  Laterality: N/A;   COLONOSCOPY WITH PROPOFOL N/A 10/27/2017   Procedure: COLONOSCOPY WITH PROPOFOL through colostomy;  Surgeon: Midge Minium, MD;  Location: [facility];  Service: Endoscopy;  Laterality: N/A;   COLOSTOMY CLOSURE N/A 11/17/2017   Procedure: COLOSTOMY CLOSURE;  Surgeon: Lattie Haw, MD;  Location: [facility];  Service: General;  Laterality: N/A;   CORONARY ARTERY BYPASS GRAFT  1999   4 vessels   excision of skin cancer     KNEE ARTHROSCOPY W/ MENISCAL REPAIR      Family History  Problem Relation Age of Onset   Lung disease Father 6    Social History   Socioeconomic History   Marital status:  Married    Spouse name: Not on file   Number of children: 2   Years of education: Not on file   Highest education level: Not on file  Occupational History   Occupation: Therapist, art  Tobacco Use   Smoking status: Former    Types: Cigars    Quit date: 09/29/1997    Years since quitting: 25.8   Smokeless tobacco: Former    Types: Chew    Quit date: 1999  Vaping Use   Vaping status: Never Used   Substance and Sexual Activity   Alcohol use: Yes    Alcohol/week: 1.0 standard drink of alcohol    Types: 1 Shots of liquor per week    Comment: one time monthly   Drug use: No   Sexual activity: Yes    Partners: Female  Other Topics Concern   Not on file  Social History Narrative   Not on file   Social Determinants of Health   Financial Resource Strain: Medium Risk (02/24/2023)   Overall Financial Resource Strain (CARDIA)    Difficulty of Paying Living Expenses: Somewhat hard  Food Insecurity: No Food Insecurity (02/08/2021)   Hunger Vital Sign    Worried About Running Out of Food in the Last Year: Never true    Ran Out of Food in the Last Year: Never true  Transportation Needs: No Transportation Needs (02/24/2023)   PRAPARE - Administrator, Civil Service (Medical): No    Lack of Transportation (Non-Medical): No  Physical Activity: Sufficiently Active (02/08/2021)   Exercise Vital Sign    Days of Exercise per Week: 5 days    Minutes of Exercise per Session: 30 min  Stress: Not on file  Social Connections: Not on file  Intimate Partner Violence: Not on file    Outpatient Medications Prior to Visit  Medication Sig Dispense Refill   carvedilol (COREG) 6.25 MG tablet Take 1 tablet (6.25 mg total) by mouth 2 (two) times daily. 180 tablet 3   Cholecalciferol (VITAMIN D) 50 MCG (2000 UT) CAPS Take 1 capsule by mouth daily.     clotrimazole-betamethasone (LOTRISONE) cream Apply 2 gr in the affected area twice a day. 45 g 2   COD LIVER OIL PO Take 1 capsule by mouth daily.     diclofenac Sodium (VOLTAREN) 1 % GEL Apply 2 g topically 2 (two) times daily as needed. 1000 g 2   ezetimibe (ZETIA) 10 MG tablet Take 1 tablet (10 mg total) by mouth every other day. 90 tablet 1   feeding supplement, ENSURE ENLIVE, (ENSURE ENLIVE) LIQD Take 237 mLs by mouth 2 (two) times daily between meals. (Patient taking differently: Take 237 mLs by mouth daily.) 237 mL 12   fexofenadine  (ALLEGRA) 180 MG tablet Take 180 mg by mouth daily as needed for allergies.     nitroGLYCERIN (NITROSTAT) 0.4 MG SL tablet PLACE 1 TAB UNDER TONGUE AS NEEDED FOR CHEST PAIN. MAY REPEAT IN 15 MINUTES UP TO 3 TIME-CHEST PAIN. 150 tablet 1   rivaroxaban (XARELTO) 20 MG TABS tablet Take 1 tablet (20 mg total) by mouth daily with supper. 90 tablet 3   silver sulfADIAZINE (SILVADENE) 1 % cream Apply 1 application topically daily. (Patient taking differently: Apply 1 application  topically daily as needed.) 50 g 2   simvastatin (ZOCOR) 10 MG tablet Take 1 tablet (10 mg total) by mouth every other day. 90 tablet 1   spironolactone (ALDACTONE) 25 MG tablet Take 1 tablet (25  mg total) by mouth daily. 90 tablet 3   vitamin C (ASCORBIC ACID) 500 MG tablet Take 500 mg by mouth 2 (two) times a week.     No facility-administered medications prior to visit.    Allergies  Allergen Reactions   Shellfish Allergy Nausea And Vomiting    Review of Systems  Musculoskeletal:  Positive for arthralgias. Negative for joint swelling.       Right thumb pain, right wrist pain, weakness right hand        Objective:        08/13/2023    1:23 PM 08/12/2023    8:00 AM 06/18/2023    8:25 AM  Vitals with BMI  Height 5\' 8"  5\' 8"  5\' 9"   Weight 226 lbs 6 oz 222 lbs 10 oz 220 lbs 13 oz  BMI 34.43 33.85 32.59  Systolic 118 128 409  Diastolic 66 86 78  Pulse 67 55 60    Orthostatic VS for the past 72 hrs (Last 3 readings):  Patient Position BP Location Cuff Size  08/13/23 1323 Sitting Left Arm Large     Physical Exam Cardiovascular:     Rate and Rhythm: Normal rate and regular rhythm.  Pulmonary:     Effort: Pulmonary effort is normal.     Breath sounds: Normal breath sounds.  Musculoskeletal:     Cervical back: Normal range of motion.     Comments: Negative Phalen's test. Mild tenderness at base of right thumb at proximal metacarpophalangeal joint. No tenderness along right first metacarpophalangeal  joint. No tenderness at distal interphalangeal joints. No tenderness along anatomical snuff box. No deformities in fingers. Some contractures in palms. Able to completely extend fingers. Negative Finkelstein's test. Some thenar wasting noted bilaterally. Very mild tenderness at the base of the right thumb.  Neurological:     General: No focal deficit present.  Psychiatric:        Mood and Affect: Mood normal.     Health Maintenance Due  Topic Date Due   Medicare Annual Wellness (AWV)  Never done   Zoster Vaccines- Shingrix (1 of 2) Never done   Pneumonia Vaccine 56+ Years old (1 of 1 - PCV) Never done   COVID-19 Vaccine (1 - 2023-24 season) Never done    There are no preventive care reminders to display for this patient.   Lab Results  Component Value Date   TSH 2.920 12/17/2022   Lab Results  Component Value Date   WBC 4.8 08/05/2023   HGB 14.9 08/05/2023   HCT 45.5 08/05/2023   MCV 91 08/05/2023   PLT 164 08/05/2023   Lab Results  Component Value Date   NA 140 08/05/2023   K 4.8 08/05/2023   CO2 29 08/05/2023   GLUCOSE 97 08/05/2023   BUN 22 08/05/2023   CREATININE 0.79 08/05/2023   BILITOT 0.5 08/05/2023   ALKPHOS 58 08/05/2023   AST 38 08/05/2023   ALT 26 08/05/2023   PROT 6.9 08/05/2023   ALBUMIN 4.6 08/05/2023   CALCIUM 9.6 08/05/2023   ANIONGAP 6 11/23/2017   EGFR 92 08/05/2023   GFR 98.27 01/23/2014   Lab Results  Component Value Date   CHOL 133 08/05/2023   Lab Results  Component Value Date   HDL 51 08/05/2023   Lab Results  Component Value Date   LDLCALC 69 08/05/2023   Lab Results  Component Value Date   TRIG 63 08/05/2023   Lab Results  Component Value Date   CHOLHDL 2.6  08/05/2023   Lab Results  Component Value Date   HGBA1C 5.3 06/18/2023       Assessment & Plan:  Right hand pain -     DG Hand Complete Right; Future  Pain of right thumb Assessment & Plan: Right hand pain at the base of the right thumb, with numbness and  pain radiating to the right index and middle fingers for two weeks, exacerbated by repetitive hand movements.   Physical exam shows mild tenderness at the base of the right thumb, thenar wasting bilaterally, and positive Phalen's maneuver.  Differential diagnosis includes carpal tunnel syndrome and possible arthritis or contracture.   Discussed carpal tunnel brace, diclofenac gel, and exercises.  Informed about potential corticosteroid injection and referral to a hand surgeon if symptoms persist. Risks of injection include infection, bleeding, and temporary pain increase. Benefits include symptom relief. Alternatives include continued conservative management and physical therapy.  - Ordered x-ray of the right hand to assess for arthritis or other anatomical abnormalities - Continue using carpal tunnel brace at night and during work - Apply diclofenac gel to the affected area - Perform carpal tunnel exercises daily - Refer to hand surgeon if symptoms persist despite conservative management - Consider corticosteroid injection into the wrist or base of thumb if pain persists - Prescribe muscle relaxant (15 tablets) to be taken at bedtime as needed - Follow up in 2-3 weeks if pain persists for possible injection  Orders: -     DG Hand Complete Right; Future  Other orders -     Methocarbamol; Take 1 tablet (750 mg total) by mouth at bedtime as needed for up to 15 days for muscle spasms.  Dispense: 15 tablet; Refill: 0     Meds ordered this encounter  Medications   methocarbamol (ROBAXIN) 750 MG tablet    Sig: Take 1 tablet (750 mg total) by mouth at bedtime as needed for up to 15 days for muscle spasms.    Dispense:  15 tablet    Refill:  0    Orders Placed This Encounter  Procedures   DG Hand Complete Right     Follow-up: No follow-ups on file.  An After Visit Summary was printed and given to the patient.  Windell Moment, MD [facility] [phone]

## 2023-08-13 NOTE — Assessment & Plan Note (Signed)
Right hand pain at the base of the right thumb, with numbness and pain radiating to the right index and middle fingers for two weeks, exacerbated by repetitive hand movements.   Physical exam shows mild tenderness at the base of the right thumb, thenar wasting bilaterally, and positive Phalen's maneuver.  Differential diagnosis includes carpal tunnel syndrome and possible arthritis or contracture.   Discussed carpal tunnel brace, diclofenac gel, and exercises.  Informed about potential corticosteroid injection and referral to a hand surgeon if symptoms persist. Risks of injection include infection, bleeding, and temporary pain increase. Benefits include symptom relief. Alternatives include continued conservative management and physical therapy.  - Ordered x-ray of the right hand to assess for arthritis or other anatomical abnormalities - Continue using carpal tunnel brace at night and during work - Apply diclofenac gel to the affected area - Perform carpal tunnel exercises daily - Refer to hand surgeon if symptoms persist despite conservative management - Consider corticosteroid injection into the wrist or base of thumb if pain persists - Prescribe muscle relaxant (15 tablets) to be taken at bedtime as needed - Follow up in 2-3 weeks if pain persists for possible injection

## 2023-08-13 NOTE — Patient Instructions (Addendum)
Apply ice or heat to the area of the pain Apply voltaren gel Please the x ray Do the carpal tunnel exercises daily Make an appt in 2-3 weeks if the pain persists, for an injection into your wrist 6. Sent muscle relaxant to the pharmacy to take at bedtime as needed

## 2023-08-17 ENCOUNTER — Ambulatory Visit (INDEPENDENT_AMBULATORY_CARE_PROVIDER_SITE_OTHER): Payer: PPO

## 2023-08-17 ENCOUNTER — Other Ambulatory Visit: Payer: Self-pay

## 2023-08-17 VITALS — BP 138/74 | HR 66 | Temp 97.8°F | Resp 16 | Ht 68.0 in | Wt 225.8 lb

## 2023-08-17 DIAGNOSIS — M79641 Pain in right hand: Secondary | ICD-10-CM | POA: Diagnosis not present

## 2023-08-17 DIAGNOSIS — M1811 Unilateral primary osteoarthritis of first carpometacarpal joint, right hand: Secondary | ICD-10-CM | POA: Diagnosis not present

## 2023-08-17 DIAGNOSIS — G5601 Carpal tunnel syndrome, right upper limb: Secondary | ICD-10-CM | POA: Diagnosis not present

## 2023-08-17 DIAGNOSIS — I4811 Longstanding persistent atrial fibrillation: Secondary | ICD-10-CM

## 2023-08-17 MED ORDER — RIVAROXABAN 20 MG PO TABS: 20.0000 mg | ORAL_TABLET | Freq: Every day | ORAL | 1 refills | Status: DC

## 2023-08-17 NOTE — Patient Instructions (Signed)
Will place referral to orthopedics for possible carpal tunnel injection vs recommendation for surgery Please use the carpal tunnel brace at nights Do the wrist stretches I do not see that an injection will help you today Return as needed

## 2023-08-17 NOTE — Progress Notes (Signed)
Acute Office Visit  Subjective:    Patient ID: Ryan Mccall, male    DOB: 1946-01-31, 77 y.o.   MRN: [id number]  Chief Complaint  Patient presents with   Hand Pain    HPI: Patient is in today hoping for possible injection  Patient presents with right hand pain due to arthritis.  He had seen me last week and presented with right thumb, second and third finger pain and numbness at nights.  He did report repetitive bending at his wrist. On exam, I had a suspicion for right carpal tunnel syndrome versus arthritis in his right hand I did get an x-ray of his right hand which did show severe arthritis at the first metacarpophalangeal joint and at the second and third finger joints also  He is here hoping for an injection but his main symptoms appear to be from carpal tunnel rather than from arthritis in his hand.     Past Medical History:  Diagnosis Date   Atrial fibrillation [facility])    Coronary artery disease    s/p remote CABG 1999 per Dr. 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   Wears dentures    full upper and lower    Past Surgical History:  Procedure Laterality Date   CATARACT EXTRACTION W/PHACO Right 01/19/2020   Procedure: CATARACT EXTRACTION PHACO AND INTRAOCULAR LENS PLACEMENT (IOC) RIGHT VISION BLUE 8.60  00:58.6;  Surgeon: Elliot Cousin, MD;  Location: [facility];  Service: Ophthalmology;  Laterality: Right;   CATARACT EXTRACTION W/PHACO Left 01/09/2021   Procedure: CATARACT EXTRACTION PHACO AND INTRAOCULAR LENS PLACEMENT (IOC) LEFT;  Surgeon: Lockie Mola, MD;  Location: [facility];  Service:  Ophthalmology;  Laterality: Left;  3.30 0:51.6 6.4%   COLECTOMY WITH COLOSTOMY CREATION/HARTMANN PROCEDURE N/A 07/03/2017   Procedure: COLECTOMY WITH COLOSTOMY CREATION/HARTMANN PROCEDURE;  Surgeon: Lattie Haw, MD;  Location: [facility] OR[facility];  Service: General;  Laterality: N/A;   COLONOSCOPY WITH PROPOFOL N/A 10/27/2017   Procedure: COLONOSCOPY WITH PROPOFOL through colostomy;  Surgeon: Midge Minium, MD;  Location: [facility];  Service: Endoscopy;  Laterality: N/A;   COLOSTOMY CLOSURE N/A 11/17/2017   Procedure: COLOSTOMY CLOSURE;  Surgeon: Lattie Haw, MD;  Location: [facility] OR[facility];  Service: General;  Laterality: N/A;   CORONARY ARTERY BYPASS GRAFT  1999   4 vessels   excision of skin cancer     KNEE ARTHROSCOPY W/ MENISCAL REPAIR      Family History  Problem Relation Age of Onset   Lung disease Father 47    Social History   Socioeconomic History   Marital status: Married    Spouse name: Not on file   Number of children: 2   Years of education: Not on file   Highest education level: Not on file  Occupational History   Occupation: Therapist, art  Tobacco Use   Smoking status: Former    Types: Cigars    Quit date: 09/29/1997    Years since quitting: 25.8   Smokeless tobacco: Former    Types: Chew    Quit date: 1999  Vaping Use   Vaping status: Never Used  Substance  and Sexual Activity   Alcohol use: Yes    Alcohol/week: 1.0 standard drink of alcohol    Types: 1 Shots of liquor per week    Comment: one time monthly   Drug use: No   Sexual activity: Yes    Partners: Female  Other Topics Concern   Not on file  Social History Narrative   Not on file   Social Determinants of Health   Financial Resource Strain: Medium Risk (02/24/2023)   Overall Financial Resource Strain (CARDIA)    Difficulty of Paying Living Expenses: Somewhat hard  Food Insecurity: No Food Insecurity (02/08/2021)   Hunger Vital Sign    Worried About Running Out of Food in the Last  Year: Never true    Ran Out of Food in the Last Year: Never true  Transportation Needs: No Transportation Needs (02/24/2023)   PRAPARE - Administrator, Civil Service (Medical): No    Lack of Transportation (Non-Medical): No  Physical Activity: Sufficiently Active (02/08/2021)   Exercise Vital Sign    Days of Exercise per Week: 5 days    Minutes of Exercise per Session: 30 min  Stress: Not on file  Social Connections: Not on file  Intimate Partner Violence: Not on file    Outpatient Medications Prior to Visit  Medication Sig Dispense Refill   carvedilol (COREG) 6.25 MG tablet Take 1 tablet (6.25 mg total) by mouth 2 (two) times daily. 180 tablet 3   Cholecalciferol (VITAMIN D) 50 MCG (2000 UT) CAPS Take 1 capsule by mouth daily.     clotrimazole-betamethasone (LOTRISONE) cream Apply 2 gr in the affected area twice a day. 45 g 2   COD LIVER OIL PO Take 1 capsule by mouth daily.     diclofenac Sodium (VOLTAREN) 1 % GEL Apply 2 g topically 2 (two) times daily as needed. 1000 g 2   ezetimibe (ZETIA) 10 MG tablet Take 1 tablet (10 mg total) by mouth every other day. 90 tablet 1   feeding supplement, ENSURE ENLIVE, (ENSURE ENLIVE) LIQD Take 237 mLs by mouth 2 (two) times daily between meals. (Patient taking differently: Take 237 mLs by mouth daily.) 237 mL 12   fexofenadine (ALLEGRA) 180 MG tablet Take 180 mg by mouth daily as needed for allergies.     methocarbamol (ROBAXIN) 750 MG tablet Take 1 tablet (750 mg total) by mouth at bedtime as needed for up to 15 days for muscle spasms. 15 tablet 0   nitroGLYCERIN (NITROSTAT) 0.4 MG SL tablet PLACE 1 TAB UNDER TONGUE AS NEEDED FOR CHEST PAIN. MAY REPEAT IN 15 MINUTES UP TO 3 TIME-CHEST PAIN. 150 tablet 1   rivaroxaban (XARELTO) 20 MG TABS tablet Take 1 tablet (20 mg total) by mouth daily with supper. 90 tablet 1   silver sulfADIAZINE (SILVADENE) 1 % cream Apply 1 application topically daily. (Patient taking differently: Apply 1  application  topically daily as needed.) 50 g 2   simvastatin (ZOCOR) 10 MG tablet Take 1 tablet (10 mg total) by mouth every other day. 90 tablet 1   spironolactone (ALDACTONE) 25 MG tablet Take 1 tablet (25 mg total) by mouth daily. 90 tablet 3   vitamin C (ASCORBIC ACID) 500 MG tablet Take 500 mg by mouth 2 (two) times a week.     No facility-administered medications prior to visit.    Allergies  Allergen Reactions   Shellfish Allergy Nausea And Vomiting    Review of Systems  Musculoskeletal:  Positive for right hand, thumb, fingers pain, weakness and numbness in his right first second and third fingers.  All other systems reviewed and are negative.      Objective:        08/17/2023    9:40 AM 08/13/2023    1:23 PM 08/12/2023    8:00 AM  Vitals with BMI  Height 5\' 8"  5\' 8"  5\' 8"   Weight 225 lbs 13 oz 226 lbs 6 oz 222 lbs 10 oz  BMI 34.34 34.43 33.85  Systolic 138 118 213  Diastolic 74 66 86  Pulse 66 67 55    Orthostatic VS for the past 72 hrs (Last 3 readings):  Patient Position BP Location Cuff Size  08/17/23 0940 Sitting Left Arm Large     Physical Exam Vitals and nursing note reviewed.  Constitutional:      Appearance: Normal appearance.  Musculoskeletal:     Comments: Once again, wasting/atrophy noted in thenar eminence bilaterally. Phalen's maneuver positive on right Some contracture of the skin noted in the palm bilaterally Grind test negative on right thumb.       Health Maintenance Due  Topic Date Due   Medicare Annual Wellness (AWV)  Never done   Zoster Vaccines- Shingrix (1 of 2) Never done   Pneumonia Vaccine 79+ Years old (1 of 1 - PCV) Never done   COVID-19 Vaccine (1 - 2023-24 season) Never done    There are no preventive care reminders to display for this patient.   Lab Results  Component Value Date   TSH 2.920 12/17/2022   Lab Results  Component Value Date   WBC 4.8 08/05/2023   HGB 14.9 08/05/2023   HCT 45.5  08/05/2023   MCV 91 08/05/2023   PLT 164 08/05/2023   Lab Results  Component Value Date   NA 140 08/05/2023   K 4.8 08/05/2023   CO2 29 08/05/2023   GLUCOSE 97 08/05/2023   BUN 22 08/05/2023   CREATININE 0.79 08/05/2023   BILITOT 0.5 08/05/2023   ALKPHOS 58 08/05/2023   AST 38 08/05/2023   ALT 26 08/05/2023   PROT 6.9 08/05/2023   ALBUMIN 4.6 08/05/2023   CALCIUM 9.6 08/05/2023   ANIONGAP 6 11/23/2017   EGFR 92 08/05/2023   GFR 98.27 01/23/2014   Lab Results  Component Value Date   CHOL 133 08/05/2023   Lab Results  Component Value Date   HDL 51 08/05/2023   Lab Results  Component Value Date   LDLCALC 69 08/05/2023   Lab Results  Component Value Date   TRIG 63 08/05/2023   Lab Results  Component Value Date   CHOLHDL 2.6 08/05/2023   Lab Results  Component Value Date   HGBA1C 5.3 06/18/2023       Assessment & Plan:  Right hand pain Assessment & Plan: His symptoms mainly appear to be secondary to carpal tunnel rather than arthritis though he does have x-ray evidence of significant arthritis in his right hand.  I do not feel that he would benefit from an injection into the base of his thumb. He would definitely need to see an orthopedist for possible carpal tunnel steroid injection versus consideration for carpal tunnel release surgery.  Referral placed urgently to [facility].  Advised to continue to use the hand brace/ wrist brace and avoid repetitive bending at the wrist.   Orders: -     Ambulatory referral to Orthopedics  Arthritis of carpometacarpal [facility]) joint of right thumb -  Ambulatory referral to Orthopedics  Right carpal tunnel syndrome -     Ambulatory referral to Orthopedics     No orders of the defined types were placed in this encounter.   Orders Placed This Encounter  Procedures   AMB referral to orthopedics     Follow-up: Return if symptoms worsen or fail to improve.  An After Visit Summary was printed and given to  the patient.  Windell Moment, MD [facility] [phone]

## 2023-08-17 NOTE — Telephone Encounter (Signed)
Prescription refill request for Xarelto received.  Indication: Afib  Last office visit: 08/12/23 ([place])  Weight: 102.7kg Age: 77 Scr: 0.79 (08/05/23)   CrCl: 115.3ml/min  Appropriate dose. Refill sent.

## 2023-08-17 NOTE — Assessment & Plan Note (Signed)
His symptoms mainly appear to be secondary to carpal tunnel rather than arthritis though he does have x-ray evidence of significant arthritis in his right hand.  I do not feel that he would benefit from an injection into the base of his thumb. He would definitely need to see an orthopedist for possible carpal tunnel steroid injection versus consideration for carpal tunnel release surgery.  Referral placed urgently to [facility].  Advised to continue to use the hand brace/ wrist brace and avoid repetitive bending at the wrist.

## 2023-08-18 ENCOUNTER — Telehealth: Payer: Self-pay | Admitting: Cardiology

## 2023-08-18 DIAGNOSIS — I4811 Longstanding persistent atrial fibrillation: Secondary | ICD-10-CM

## 2023-08-18 MED ORDER — RIVAROXABAN 20 MG PO TABS: 20.0000 mg | ORAL_TABLET | Freq: Every day | ORAL | 6 refills | Status: DC

## 2023-08-18 NOTE — Telephone Encounter (Signed)
Pt c/o medication issue:  1. Name of Medication:   rivaroxaban (XARELTO) 20 MG TABS tablet   2. How are you currently taking this medication (dosage and times per day)?  As prescribed  3. Are you having a reaction (difficulty breathing--STAT)?   No  4. What is your medication issue?   Patient wants a call back directly from [facility] and stated he is in the donut hole and wants to get enough medication to take until January.  [facility] [address] [place] - [address] AT [facility] for his local prescriptions.

## 2023-08-18 NOTE — Telephone Encounter (Signed)
Spoke to patient he requested Xarelto samples.Advised office presently out of samples.[facility] [place].

## 2023-08-31 DIAGNOSIS — G5601 Carpal tunnel syndrome, right upper limb: Secondary | ICD-10-CM | POA: Diagnosis not present

## 2023-08-31 DIAGNOSIS — M1811 Unilateral primary osteoarthritis of first carpometacarpal joint, right hand: Secondary | ICD-10-CM | POA: Diagnosis not present

## 2023-09-01 ENCOUNTER — Telehealth: Payer: Self-pay | Admitting: Cardiology

## 2023-09-01 NOTE — Telephone Encounter (Signed)
Patient wants a call back directly form RN Elnita Maxwell.

## 2023-09-02 ENCOUNTER — Other Ambulatory Visit: Payer: Self-pay

## 2023-09-02 ENCOUNTER — Telehealth: Payer: Self-pay | Admitting: Cardiology

## 2023-09-02 MED ORDER — CARVEDILOL 6.25 MG PO TABS: 6.2500 mg | ORAL_TABLET | Freq: Two times a day (BID) | ORAL | 3 refills | Status: DC

## 2023-09-02 MED ORDER — SPIRONOLACTONE 25 MG PO TABS: 25.0000 mg | ORAL_TABLET | Freq: Every day | ORAL | 3 refills | Status: DC

## 2023-09-02 NOTE — Telephone Encounter (Signed)
 RX sent to requested Pharmacy

## 2023-09-02 NOTE — Telephone Encounter (Signed)
Spoke to patient he stated he needed a refill on carvedilol,but already took care of.

## 2023-09-02 NOTE — Telephone Encounter (Signed)
*  STAT* If patient is at the pharmacy, call can be transferred to refill team.   1. Which medications need to be refilled? (please list name of each medication and dose if known)   carvedilol (COREG) 6.25 MG tablet    spironolactone (ALDACTONE) 25 MG tablet   2. Which pharmacy/location (including street and city if local pharmacy) is medication to be sent to? [id number] [place], [place] - [address] AT [facility] Phone: [phone]  Fax: [phone]     3. Do they need a 30 day or 90 day supply? 90 Pt has 3 days left of Carvediol

## 2023-09-14 ENCOUNTER — Ambulatory Visit: Payer: PPO

## 2023-09-14 VITALS — BP 126/77 | HR 60 | Ht 68.0 in | Wt 217.8 lb

## 2023-09-14 DIAGNOSIS — Z0000 Encounter for general adult medical examination without abnormal findings: Secondary | ICD-10-CM

## 2023-09-14 NOTE — Patient Instructions (Signed)
Mr. Rotman , Thank you for taking time to come for your Medicare Wellness Visit. I appreciate your ongoing commitment to your health goals. Please review the following plan we discussed and let me know if I can assist you in the future.   Referrals/Orders/Follow-Ups/Clinician Recommendations: No  This is a list of the screening recommended for you and due dates:  Health Maintenance  Topic Date Due   Zoster (Shingles) Vaccine (1 of 2) 03/27/1997   COVID-19 Vaccine (1 - 2024-25 season) Never done   Medicare Annual Wellness Visit  09/13/2024   DTaP/Tdap/Td vaccine (2 - Td or Tdap) 07/03/2030   Pneumonia Vaccine  Completed   Flu Shot  Completed   Hepatitis C Screening  Completed   HPV Vaccine  Aged Out   Colon Cancer Screening  Discontinued    Advanced directives: (Copy Requested) Please bring a copy of your health care power of attorney and living will to the office to be added to your chart at your convenience.  Next Medicare Annual Wellness Visit scheduled for next year: No

## 2023-09-14 NOTE — Progress Notes (Signed)
Subjective:   Ryan Mccall is a 77 y.o. male who presents for an Initial Medicare Annual Wellness Visit.  Visit Complete: Virtual I connected with  [name] on 09/14/23 by a audio enabled telemedicine application and verified that I am speaking with the correct person using two identifiers.  Patient Location: Home  Provider Location: Home Office  I discussed the limitations of evaluation and management by telemedicine. The patient expressed understanding and agreed to proceed.  Vital Signs: Because this visit was a virtual/telehealth visit, some criteria may be missing or patient reported. Any vitals not documented were not able to be obtained and vitals that have been documented are patient reported.  Cardiac Risk Factors include: advanced age (>70men, >65 women);dyslipidemia;hypertension;male gender;obesity (BMI >30kg/m2)     Objective:    Today's Vitals   09/14/23 0841  BP: 126/77  Pulse: 60  Weight: 217 lb 12.8 oz (98.8 kg)  Height: 5\' 8"  (1.727 m)  PainSc: 0-No pain   Body mass index is 33.12 kg/m.     09/14/2023    8:43 AM 01/09/2021   12:37 PM 01/19/2020    7:50 AM 11/17/2017    3:58 PM 11/09/2017    9:15 AM 07/02/2017    3:00 AM 07/01/2017    4:05 PM  Advanced Directives  Does Patient Have a Medical Advance Directive? Yes Yes No No No Yes No  Type of Advance Directive Living will Healthcare Power of [place];Living will    Living will   Does patient want to make changes to medical advance directive?  No - Patient declined    No - Patient declined   Copy of Healthcare Power of Attorney in Chart?  Yes - validated most recent copy scanned in chart (See row information)       Would patient like information on creating a medical advance directive?   No - Patient declined No - Patient declined No - Patient declined No - Patient declined     Current Medications (verified) Outpatient Encounter Medications as of 09/14/2023  Medication Sig   carvedilol (COREG) 6.25  MG tablet Take 1 tablet (6.25 mg total) by mouth 2 (two) times daily.   Cholecalciferol (VITAMIN D) 50 MCG (2000 UT) CAPS Take 1 capsule by mouth daily.   clotrimazole-betamethasone (LOTRISONE) cream Apply 2 gr in the affected area twice a day.   COD LIVER OIL PO Take 1 capsule by mouth daily.   diclofenac Sodium (VOLTAREN) 1 % GEL Apply 2 g topically 2 (two) times daily as needed.   ezetimibe (ZETIA) 10 MG tablet Take 1 tablet (10 mg total) by mouth every other day.   feeding supplement, ENSURE ENLIVE, (ENSURE ENLIVE) LIQD Take 237 mLs by mouth 2 (two) times daily between meals. (Patient taking differently: Take 237 mLs by mouth daily.)   fexofenadine (ALLEGRA) 180 MG tablet Take 180 mg by mouth daily as needed for allergies.   nitroGLYCERIN (NITROSTAT) 0.4 MG SL tablet PLACE 1 TAB UNDER TONGUE AS NEEDED FOR CHEST PAIN. MAY REPEAT IN 15 MINUTES UP TO 3 TIME-CHEST PAIN.   rivaroxaban (XARELTO) 20 MG TABS tablet Take 1 tablet (20 mg total) by mouth daily with supper.   silver sulfADIAZINE (SILVADENE) 1 % cream Apply 1 application topically daily. (Patient taking differently: Apply 1 application  topically daily as needed.)   simvastatin (ZOCOR) 10 MG tablet Take 1 tablet (10 mg total) by mouth every other day.   spironolactone (ALDACTONE) 25 MG tablet Take 1 tablet (25 mg  total) by mouth daily.   vitamin C (ASCORBIC ACID) 500 MG tablet Take 500 mg by mouth 2 (two) times a week.   No facility-administered encounter medications on file as of 09/14/2023.    Allergies (verified) Shellfish allergy   History: Past Medical History:  Diagnosis Date   Atrial fibrillation (HCC)    Coronary artery disease    s/p remote CABG 1999 per Dr. 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   Wears dentures    full upper and lower   Past Surgical History:  Procedure Laterality Date   CATARACT EXTRACTION W/PHACO Right 01/19/2020   Procedure: CATARACT EXTRACTION PHACO AND INTRAOCULAR LENS PLACEMENT (IOC) RIGHT VISION BLUE 8.60  00:58.6;  Surgeon: Elliot Cousin, MD;  Location: [facility];  Service: Ophthalmology;  Laterality: Right;   CATARACT EXTRACTION W/PHACO Left 01/09/2021   Procedure: CATARACT EXTRACTION PHACO AND INTRAOCULAR LENS PLACEMENT (IOC) LEFT;  Surgeon: Lockie Mola, MD;  Location: [facility];  Service: Ophthalmology;  Laterality: Left;  3.30 0:51.6 6.4%   COLECTOMY WITH COLOSTOMY CREATION/HARTMANN PROCEDURE N/A 07/03/2017   Procedure: COLECTOMY WITH COLOSTOMY CREATION/HARTMANN PROCEDURE;  Surgeon: Lattie Haw, MD;  Location: [facility];  Service: General;  Laterality: N/A;   COLONOSCOPY WITH PROPOFOL N/A 10/27/2017   Procedure: COLONOSCOPY WITH PROPOFOL through colostomy;  Surgeon: Midge Minium, MD;  Location: [facility];  Service: Endoscopy;  Laterality: N/A;   COLOSTOMY CLOSURE N/A 11/17/2017   Procedure: COLOSTOMY CLOSURE;  Surgeon: Lattie Haw, MD;  Location: [facility];  Service: General;  Laterality: N/A;   CORONARY ARTERY BYPASS GRAFT  1999   4 vessels   excision of skin cancer     KNEE ARTHROSCOPY W/ MENISCAL REPAIR     Family History  Problem Relation Age of Onset   Lung disease Father 70   Social History   Socioeconomic History   Marital status: Married    Spouse name: Not on file   Number of children: 2   Years of education: Not on file   Highest education level: Not on file  Occupational History   Occupation: Therapist, art  Tobacco Use   Smoking status: Former    Types: Cigars    Quit date: 09/29/1997    Years since quitting: 25.9   Smokeless tobacco: Former    Types:  Chew    Quit date: 1999  Vaping Use   Vaping status: Never Used  Substance and Sexual Activity   Alcohol use: Yes    Alcohol/week: 1.0 standard drink of alcohol    Types: 1 Shots of liquor per week    Comment: one time monthly   Drug use: No   Sexual activity: Yes    Partners: Female  Other Topics Concern   Not on file  Social History Narrative   Not on file   Social Drivers of Health   Financial Resource Strain: Medium Risk (09/14/2023)   Overall Financial Resource Strain (CARDIA)    Difficulty of Paying Living Expenses: Somewhat hard  Food Insecurity: No Food Insecurity (09/14/2023)   Hunger Vital Sign    Worried About Running Out of Food in the  Last Year: Never true    Ran Out of Food in the Last Year: Never true  Transportation Needs: No Transportation Needs (09/14/2023)   PRAPARE - Administrator, Civil Service (Medical): No    Lack of Transportation (Non-Medical): No  Physical Activity: Sufficiently Active (09/14/2023)   Exercise Vital Sign    Days of Exercise per Week: 5 days    Minutes of Exercise per Session: 30 min  Stress: No Stress Concern Present (09/14/2023)   [employer] of Occupational Health - Occupational Stress Questionnaire    Feeling of Stress : Not at all  Social Connections: Socially Integrated (09/14/2023)   Social Connection and Isolation Panel [NHANES]    Frequency of Communication with Friends and Family: More than three times a week    Frequency of Social Gatherings with Friends and Family: More than three times a week    Attends Religious Services: More than 4 times per year    Active Member of [employer] or Organizations: Yes    Attends Engineer, structural: More than 4 times per year    Marital Status: Married    Tobacco Counseling Counseling given: Not Answered   Clinical Intake:  Pre-visit preparation completed: Yes  Pain : No/denies pain Pain Score: 0-No pain     BMI - recorded: 33.12 Nutritional  Status: BMI > 30  Obese Nutritional Risks: None Diabetes: No  How often do you need to have someone help you when you read instructions, pamphlets, or other written materials from your doctor or pharmacy?: 1 - Never What is the last grade level you completed in school?: HSG; 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 Shalaya Swailes, LPN.   Activities of Daily Living    09/14/2023    8:46 AM  In your present state of health, do you have any difficulty performing the following activities:  Hearing? 0  Vision? 0  Difficulty concentrating or making decisions? 0  Walking or climbing stairs? 0  Dressing or bathing? 0  Doing errands, shopping? 0  Preparing Food and eating ? N  Using the Toilet? N  In the past six months, have you accidently leaked urine? N  Do you have problems with loss of bowel control? N  Managing your Medications? N  Managing your Finances? N  Housekeeping or managing your Housekeeping? N    Patient Care Team: Windell Moment, MD as PCP - General (Family Medicine) [place], [name] M, MD as PCP - Cardiology (Cardiology) Lockie Mola, MD as Referring Physician (Ophthalmology)  Indicate any recent Medical Services you may have received from other than [facility] providers in the past year (date may be approximate).     Assessment:   This is a routine wellness examination for Payne.  Hearing/Vision screen Hearing Screening - Comments:: Denies any hearing difficulties; no hearing aids.   Vision Screening - Comments:: Lens implant- up to date with routine eye exams with [facility]- Deirdre Evener, MD (next appt: 10/19/2023)    Goals Addressed   None   Depression Screen    09/14/2023    8:45 AM 06/18/2023    8:30 AM 02/27/2023   10:15 AM 12/17/2022    7:30 AM 12/12/2021    7:29 AM 06/14/2021    7:35 AM 02/08/2021   10:08 AM  PHQ 2/9 Scores  PHQ - 2 Score 0 0 0 0 0 0 0  PHQ- 9 Score 0 2 1  Fall Risk    09/14/2023    8:45 AM 06/18/2023    8:29 AM 02/27/2023   10:14 AM 12/17/2022    7:29 AM 06/18/2022    7:33 AM  Fall Risk   Falls in the past year? 0 0 1 0 1  Number falls in past yr: 0 0 0 0 0  Injury with Fall? 0 0 0 0 1  Risk for fall due to : No Fall Risks No Fall Risks No Fall Risks No Fall Risks No Fall Risks  Follow up Falls prevention discussed  Falls evaluation completed;Falls prevention discussed Falls evaluation completed Falls evaluation completed    MEDICARE RISK AT HOME: Medicare Risk at Home Any stairs in or around the home?: No (front & back porch) If so, are there any without handrails?: No Home free of loose throw rugs in walkways, pet beds, electrical cords, etc?: Yes Adequate lighting in your home to reduce risk of falls?: Yes Life alert?: No Use of a cane, walker or w/c?: No Grab bars in the bathroom?: No Shower chair or bench in shower?: No Elevated toilet seat or a handicapped toilet?: No  TIMED UP AND GO:  Was the test performed? No    Cognitive Function:    09/14/2023    8:59 AM  MMSE - Mini Mental State Exam  Not completed: Unable to complete        09/14/2023    8:45 AM  6CIT Screen  What Year? 0 points  What month? 0 points  What time? 0 points  Count back from 20 0 points  Months in reverse 0 points  Repeat phrase 0 points  Total Score 0 points    Immunizations Immunization History  Administered Date(s) Administered   Fluad Quad(high Dose 65+) 07/03/2020, 07/20/2023   Influenza,inj,Quad PF,6+ Mos 07/15/2013   Influenza-Unspecified 07/30/2018   Pneumococcal Conjugate-13 10/12/2014   Pneumococcal Polysaccharide-23 10/07/2012   Tdap 07/03/2020   Zoster, Live 09/03/2012    TDAP status: Up to date  Flu Vaccine status: Up to date  Pneumococcal vaccine status: Up to date  Covid-19 vaccine status: Declined, Education has been provided regarding the importance of this vaccine but patient still declined. Advised  may receive this vaccine at local pharmacy or Health Dept.or vaccine clinic. Aware to provide a copy of the vaccination record if obtained from local pharmacy or Health Dept. Verbalized acceptance and understanding.  Qualifies for Shingles Vaccine? Yes   Zostavax completed Yes   Shingrix Completed?: No.    Education has been provided regarding the importance of this vaccine. Patient has been advised to call insurance company to determine out of pocket expense if they have not yet received this vaccine. Advised may also receive vaccine at local pharmacy or Health Dept. Verbalized acceptance and understanding.  Screening Tests Health Maintenance  Topic Date Due   Zoster Vaccines- Shingrix (1 of 2) 03/27/1997   COVID-19 Vaccine (1 - 2024-25 season) Never done   Medicare Annual Wellness (AWV)  09/13/2024   DTaP/Tdap/Td (2 - Td or Tdap) 07/03/2030   Pneumonia Vaccine 42+ Years old  Completed   INFLUENZA VACCINE  Completed   Hepatitis C Screening  Completed   HPV VACCINES  Aged Out   Colonoscopy  Discontinued    Health Maintenance  Health Maintenance Due  Topic Date Due   Zoster Vaccines- Shingrix (1 of 2) 03/27/1997   COVID-19 Vaccine (1 - 2024-25 season) Never done    Colorectal cancer screening: No longer required.  Lung Cancer Screening: (Low Dose CT Chest recommended if Age 32-80 years, 20 pack-year currently smoking OR have quit w/in 15years.) does not qualify.   Lung Cancer Screening Referral: no  Additional Screening:  Hepatitis C Screening: does qualify; Completed 06/18/2022  Vision Screening: Recommended annual ophthalmology exams for early detection of glaucoma and other disorders of the eye. Is the patient up to date with their annual eye exam?  Yes  Who is the provider or what is the name of the office in which the patient attends annual eye exams? Deirdre Evener, MD. If pt is not established with a provider, would they like to be referred to a provider to  establish care? No .   Dental Screening: Recommended annual dental exams for proper oral hygiene  Diabetic Foot Exam: N/A  Community Resource Referral / Chronic Care Management: CRR required this visit?  No   CCM required this visit?  No    Plan:     I have personally reviewed and noted the following in the patient's chart:   Medical and social history Use of alcohol, tobacco or illicit drugs  Current medications and supplements including opioid prescriptions. Patient is not currently taking opioid prescriptions. Functional ability and status Nutritional status Physical activity Advanced directives List of other physicians Hospitalizations, surgeries, and ER visits in previous 12 months Vitals Screenings to include cognitive, depression, and falls Referrals and appointments  In addition, I have reviewed and discussed with patient certain preventive protocols, quality metrics, and best practice recommendations. A written personalized care plan for preventive services as well as general preventive health recommendations were provided to patient.     Mickeal Needy, LPN   16/06/[id number]   After Visit Summary: (MyChart) Due to this being a telephonic visit, the after visit summary with patients personalized plan was offered to patient via MyChart   Nurse Notes: None at this time.

## 2023-10-12 ENCOUNTER — Other Ambulatory Visit: Payer: Self-pay

## 2023-10-12 DIAGNOSIS — G5601 Carpal tunnel syndrome, right upper limb: Secondary | ICD-10-CM | POA: Diagnosis not present

## 2023-10-12 DIAGNOSIS — M1811 Unilateral primary osteoarthritis of first carpometacarpal joint, right hand: Secondary | ICD-10-CM | POA: Diagnosis not present

## 2023-10-12 MED ORDER — EZETIMIBE 10 MG PO TABS: 10.0000 mg | ORAL_TABLET | ORAL | 1 refills | Status: DC

## 2023-10-12 MED ORDER — SIMVASTATIN 10 MG PO TABS: 10.0000 mg | ORAL_TABLET | ORAL | 1 refills | Status: DC

## 2023-10-12 NOTE — Telephone Encounter (Signed)
 Copied from CRM [id number]. Topic: Clinical - Medication Refill >> Oct 12, 2023 12:13 PM Elle L wrote: Most Recent Primary Care Visit:  Provider: TOMIE ROZ SAILOR  Department: [facility]  Visit Type: MEDICARE AWV, INITIAL  Date: 09/14/2023  Medication: ***  Has the patient contacted their pharmacy?  (Agent: If no, request that the patient contact the pharmacy for the refill. If patient does not wish to contact the pharmacy document the reason why and proceed with request.) (Agent: If yes, when and what did the pharmacy advise?)  Is this the correct pharmacy for this prescription?  If no, delete pharmacy and type the correct one.  This is the patient's preferred pharmacy:  [id number] [place], [place] - [address] AT [facility] [address] [place] [address] Phone: [phone] Fax: [phone]   Has the prescription been filled recently?   Is the patient out of the medication?   Has the patient been seen for an appointment in the last year OR does the patient have an upcoming appointment?   Can we respond through MyChart?   Agent: Please be advised that Rx refills may take up to 3 business days. We ask that you follow-up with your pharmacy.

## 2023-10-19 DIAGNOSIS — H26491 Other secondary cataract, right eye: Secondary | ICD-10-CM | POA: Diagnosis not present

## 2023-10-19 DIAGNOSIS — H35371 Puckering of macula, right eye: Secondary | ICD-10-CM | POA: Diagnosis not present

## 2023-10-19 DIAGNOSIS — H43813 Vitreous degeneration, bilateral: Secondary | ICD-10-CM | POA: Diagnosis not present

## 2023-10-19 DIAGNOSIS — Z961 Presence of intraocular lens: Secondary | ICD-10-CM | POA: Diagnosis not present

## 2023-11-16 DIAGNOSIS — M65342 Trigger finger, left ring finger: Secondary | ICD-10-CM | POA: Diagnosis not present

## 2023-11-16 DIAGNOSIS — M1811 Unilateral primary osteoarthritis of first carpometacarpal joint, right hand: Secondary | ICD-10-CM | POA: Diagnosis not present

## 2023-11-16 DIAGNOSIS — G5601 Carpal tunnel syndrome, right upper limb: Secondary | ICD-10-CM | POA: Diagnosis not present

## 2023-12-02 DIAGNOSIS — D225 Melanocytic nevi of trunk: Secondary | ICD-10-CM | POA: Diagnosis not present

## 2023-12-02 DIAGNOSIS — L820 Inflamed seborrheic keratosis: Secondary | ICD-10-CM | POA: Diagnosis not present

## 2023-12-02 DIAGNOSIS — D2239 Melanocytic nevi of other parts of face: Secondary | ICD-10-CM | POA: Diagnosis not present

## 2023-12-02 DIAGNOSIS — L814 Other melanin hyperpigmentation: Secondary | ICD-10-CM | POA: Diagnosis not present

## 2023-12-02 DIAGNOSIS — L821 Other seborrheic keratosis: Secondary | ICD-10-CM | POA: Diagnosis not present

## 2023-12-02 DIAGNOSIS — D485 Neoplasm of uncertain behavior of skin: Secondary | ICD-10-CM | POA: Diagnosis not present

## 2023-12-17 ENCOUNTER — Ambulatory Visit: Payer: PPO

## 2023-12-17 VITALS — BP 120/68 | HR 70 | Temp 97.8°F | Ht 68.0 in | Wt 221.6 lb

## 2023-12-17 DIAGNOSIS — M25812 Other specified joint disorders, left shoulder: Secondary | ICD-10-CM

## 2023-12-17 DIAGNOSIS — I10 Essential (primary) hypertension: Secondary | ICD-10-CM

## 2023-12-17 DIAGNOSIS — E6609 Other obesity due to excess calories: Secondary | ICD-10-CM

## 2023-12-17 DIAGNOSIS — Z6833 Body mass index (BMI) 33.0-33.9, adult: Secondary | ICD-10-CM | POA: Diagnosis not present

## 2023-12-17 DIAGNOSIS — E7800 Pure hypercholesterolemia, unspecified: Secondary | ICD-10-CM | POA: Diagnosis not present

## 2023-12-17 DIAGNOSIS — E66811 Obesity, class 1: Secondary | ICD-10-CM

## 2023-12-17 DIAGNOSIS — I4891 Unspecified atrial fibrillation: Secondary | ICD-10-CM

## 2023-12-17 DIAGNOSIS — G5601 Carpal tunnel syndrome, right upper limb: Secondary | ICD-10-CM

## 2023-12-17 NOTE — Assessment & Plan Note (Signed)
 Recommend to continue to stay active, increase physical activity and exercise and eat healthy

## 2023-12-17 NOTE — Assessment & Plan Note (Signed)
 Left Shoulder Pain Chronic left shoulder pain, previously managed with an injection on Feb 27, 2023. Reports increasing soreness, especially when sleeping on the left side. Pain is not severe enough to warrant another injection at this time. Risks of frequent injections include worsening arthritis. - Encourage shoulder exercises and stretching - Use heat and Tylenol for pain management - Consider shoulder injection if pain becomes severe - Reassess in a few months if pain worsens

## 2023-12-17 NOTE — Assessment & Plan Note (Signed)
 Hypertension well controlled with carvedilol 6.25 mg TWICE DAILY  and spironolactone 25 mg once daily. Blood pressure readings are consistently within target range.

## 2023-12-17 NOTE — Assessment & Plan Note (Signed)
 Hyperlipidemia managed with Zetia 10 mg daily and simvastatin 10 mg every other day due to joint pain. Cholesterol levels were good in November 2024. No immediate need for blood work as previous results were normal. - Continue current lipid-lowering regimen - Reassess lipid panel in 2-3 months

## 2023-12-17 NOTE — Patient Instructions (Signed)
 VISIT SUMMARY:  Today, we discussed your left shoulder pain and followed up on your chronic conditions, including carpal tunnel syndrome, atrial fibrillation, hypertension, and hyperlipidemia. You are managing your conditions well with your current medications and lifestyle habits. We also reviewed your recent blood pressure readings and noted that your cholesterol levels were good as of your last blood work in November 2024.  YOUR PLAN:  -LEFT SHOULDER PAIN: You have chronic left shoulder pain that was previously managed with an injection. The pain has been increasing, especially when you sleep on that side. We recommend continuing shoulder exercises and stretching, using heat and Tylenol for pain management, and considering another injection if the pain becomes severe. We will reassess in a few months if the pain worsens.  -CARPAL TUNNEL SYNDROME: You have carpal tunnel syndrome in your right hand, which was previously treated with an injection that alleviated the pain but left some numbness. You are not ready for surgery, so we advise continuing with carpal tunnel exercises and using a brace during activities to manage your symptoms.  -ATRIAL FIBRILLATION: Atrial fibrillation is an irregular and often rapid heart rate. Your condition is managed with carvedilol and Xarelto, and your blood pressure and heart rate are well controlled. You experience occasional dizziness, which may be related to your medications. We will monitor for dizziness and consider adjusting your medications if it becomes frequent.  -HYPERTENSION: Hypertension is high blood pressure. Your blood pressure is well controlled with carvedilol and spironolactone. Your recent readings are consistently within the target range.  -HYPERLIPIDEMIA: Hyperlipidemia is high cholesterol. Your condition is managed with Zetia and simvastatin. Your cholesterol levels were good in November 2024, so there is no immediate need for blood work. We will  reassess your lipid panel in 2-3 months.  INSTRUCTIONS:  Please continue with your current medications and lifestyle habits. Monitor for any increase in dizziness and let us know if it becomes frequent. We will reassess your shoulder pain in a few months and your lipid panel in 2-3 months. If you have any concerns or if your symptoms worsen, please contact our office.

## 2023-12-17 NOTE — Progress Notes (Signed)
 Subjective:  Patient ID: Ryan Mccall, male    DOB: [birth year]  Age: 78 y.o. MRN: [id number]  Chief Complaint  Patient presents with   Medical Management of Chronic Issues    Discussed the use of AI scribe software for clinical note transcription with the patient, who gave verbal consent to proceed.   Ryan Mccall is a 78 year old male who presents with left shoulder pain and follow-up on multiple chronic conditions.   He experiences soreness in his left shoulder, which is the same shoulder where he received an injection on Feb 27, 2023. The pain relief from the injection has been diminishing, particularly because he sleeps on that shoulder. He describes the pain as 'getting a little rough' but is not ready for another injection yet. He engages in physical activities such as mowing the lawn.  He has a history of carpal tunnel syndrome in his right hand, for which he received a shot that alleviated the pain, although numbness persists. He is trying to avoid surgery and performs carpal tunnel exercises regularly. He uses a brace during activities to help manage symptoms.  He monitors his blood pressure regularly, with readings from March 3 to March 20 showing well-controlled levels between 110-125 systolic and 60-80 diastolic. He experiences occasional dizziness, which he attributes to spironolactone, taken at 25 mg daily. He also takes carvedilol 6.25 mg twice daily for atrial fibrillation and blood pressure control.  For cholesterol management, he takes Zetia 10 mg daily and simvastatin 10 mg every other day due to joint pain. He also takes Xarelto 20 mg daily with supper. No dizziness when blood pressure is low and no swelling in the legs, wearing compression socks as needed.  He stays active by engaging in activities such as mowing the lawn and performing household tasks. No recent blood work since November 2024, but all parameters were normal. He is up to date with vaccinations and eye  exams.      12/17/2023    8:18 AM 09/14/2023    8:45 AM 06/18/2023    8:30 AM 02/27/2023   10:15 AM 12/17/2022    7:30 AM  Depression screen PHQ 2/9  Decreased Interest 0 0 0 0 0  Down, Depressed, Hopeless 0 0 0 0 0  PHQ - 2 Score 0 0 0 0 0  Altered sleeping  0 0 0   Tired, decreased energy  0 1 1   Change in appetite  0 1 0   Feeling bad or failure about yourself   0 0 0   Trouble concentrating  0 0 0   Moving slowly or fidgety/restless  0 0 0   Suicidal thoughts  0 0 0   PHQ-9 Score  0 2 1   Difficult doing work/chores  Not difficult at all Not difficult at all Not difficult at all         12/17/2023    8:18 AM  Fall Risk   Falls in the past year? 0  Number falls in past yr: 0  Injury with Fall? 0  Risk for fall due to : No Fall Risks    Patient Care Team: Windell Moment, MD as PCP - General (Family Medicine) [place], [name] M, MD as PCP - Cardiology (Cardiology) Lockie Mola, MD as Referring Physician (Ophthalmology)   Review of Systems  Constitutional:  Negative for chills, fatigue and fever.  HENT:  Negative for congestion, ear pain, sinus pressure and sore throat.   Respiratory:  Negative for cough.   Cardiovascular:  Negative for chest pain.  Gastrointestinal:  Negative for abdominal pain, constipation, diarrhea, nausea and vomiting.  Genitourinary:  Negative for dysuria and frequency.  Musculoskeletal:  Positive for arthralgias (left shoulder pain). Negative for back pain and myalgias.  Skin: Negative.   Neurological:  Negative for dizziness and headaches.  Psychiatric/Behavioral:  Negative for dysphoric mood. The patient is not nervous/anxious.     Current Outpatient Medications on File Prior to Visit  Medication Sig Dispense Refill   carvedilol (COREG) 6.25 MG tablet Take 1 tablet (6.25 mg total) by mouth 2 (two) times daily. 180 tablet 3   Cholecalciferol (VITAMIN D) 50 MCG (2000 UT) CAPS Take 1 capsule by mouth daily.      clotrimazole-betamethasone (LOTRISONE) cream Apply 2 gr in the affected area twice a day. 45 g 2   COD LIVER OIL PO Take 1 capsule by mouth daily.     diclofenac Sodium (VOLTAREN) 1 % GEL Apply 2 g topically 2 (two) times daily as needed. 1000 g 2   ezetimibe (ZETIA) 10 MG tablet Take 1 tablet (10 mg total) by mouth every other day. 90 tablet 1   feeding supplement, ENSURE ENLIVE, (ENSURE ENLIVE) LIQD Take 237 mLs by mouth 2 (two) times daily between meals. (Patient taking differently: Take 237 mLs by mouth daily.) 237 mL 12   fexofenadine (ALLEGRA) 180 MG tablet Take 180 mg by mouth daily as needed for allergies.     nitroGLYCERIN (NITROSTAT) 0.4 MG SL tablet PLACE 1 TAB UNDER TONGUE AS NEEDED FOR CHEST PAIN. MAY REPEAT IN 15 MINUTES UP TO 3 TIME-CHEST PAIN. 150 tablet 1   rivaroxaban (XARELTO) 20 MG TABS tablet Take 1 tablet (20 mg total) by mouth daily with supper. 30 tablet 6   simvastatin (ZOCOR) 10 MG tablet Take 1 tablet (10 mg total) by mouth every other day. 90 tablet 1   spironolactone (ALDACTONE) 25 MG tablet Take 1 tablet (25 mg total) by mouth daily. 90 tablet 3   vitamin C (ASCORBIC ACID) 500 MG tablet Take 500 mg by mouth 2 (two) times a week.     silver sulfADIAZINE (SILVADENE) 1 % cream Apply 1 application topically daily. (Patient not taking: Reported on 12/17/2023) 50 g 2   No current facility-administered medications on file prior to visit.   Past Medical History:  Diagnosis Date   Atrial fibrillation [facility])    Coronary artery disease    s/p remote CABG 1999 per Dr. 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   Wears dentures     full upper and lower   Past Surgical History:  Procedure Laterality Date   CATARACT EXTRACTION W/PHACO Right 01/19/2020   Procedure: CATARACT EXTRACTION PHACO AND INTRAOCULAR LENS PLACEMENT (IOC) RIGHT VISION BLUE 8.60  00:58.6;  Surgeon: Elliot Cousin, MD;  Location: [facility];  Service: Ophthalmology;  Laterality: Right;   CATARACT EXTRACTION W/PHACO Left 01/09/2021   Procedure: CATARACT EXTRACTION PHACO AND INTRAOCULAR LENS PLACEMENT (IOC) LEFT;  Surgeon: Lockie Mola, MD;  Location: [facility];  Service: Ophthalmology;  Laterality: Left;  3.30 0:51.6 6.4%   COLECTOMY WITH  COLOSTOMY CREATION/HARTMANN PROCEDURE N/A 07/03/2017   Procedure: COLECTOMY WITH COLOSTOMY CREATION/HARTMANN PROCEDURE;  Surgeon: Lattie Haw, MD;  Location: [facility];  Service: General;  Laterality: N/A;   COLONOSCOPY WITH PROPOFOL N/A 10/27/2017   Procedure: COLONOSCOPY WITH PROPOFOL through colostomy;  Surgeon: Midge Minium, MD;  Location: [facility];  Service: Endoscopy;  Laterality: N/A;   COLOSTOMY CLOSURE N/A 11/17/2017   Procedure: COLOSTOMY CLOSURE;  Surgeon: Lattie Haw, MD;  Location: [facility];  Service: General;  Laterality: N/A;   CORONARY ARTERY BYPASS GRAFT  1999   4 vessels   excision of skin cancer     KNEE ARTHROSCOPY W/ MENISCAL REPAIR      Family History  Problem Relation Age of Onset   Lung disease Father 37   Social History   Socioeconomic History   Marital status: Married    Spouse name: Not on file   Number of children: 2   Years of education: Not on file   Highest education level: Not on file  Occupational History   Occupation: Therapist, art  Tobacco Use   Smoking status: Former    Types: Cigars    Quit date: 09/29/1997    Years since quitting: 26.2   Smokeless tobacco: Former    Types: Chew    Quit date: 1999  Vaping Use   Vaping status: Never Used  Substance and Sexual Activity   Alcohol use: Yes    Alcohol/week: 1.0  standard drink of alcohol    Types: 1 Shots of liquor per week    Comment: one time monthly   Drug use: No   Sexual activity: Yes    Partners: Female  Other Topics Concern   Not on file  Social History Narrative   Not on file   Social Drivers of Health   Financial Resource Strain: Medium Risk (09/14/2023)   Overall Financial Resource Strain (CARDIA)    Difficulty of Paying Living Expenses: Somewhat hard  Food Insecurity: No Food Insecurity (09/14/2023)   Hunger Vital Sign    Worried About Running Out of Food in the Last Year: Never true    Ran Out of Food in the Last Year: Never true  Transportation Needs: No Transportation Needs (09/14/2023)   PRAPARE - Administrator, Civil Service (Medical): No    Lack of Transportation (Non-Medical): No  Physical Activity: Sufficiently Active (09/14/2023)   Exercise Vital Sign    Days of Exercise per Week: 5 days    Minutes of Exercise per Session: 30 min  Stress: No Stress Concern Present (09/14/2023)   [employer] of Occupational Health - Occupational Stress Questionnaire    Feeling of Stress : Not at all  Social Connections: Socially Integrated (09/14/2023)   Social Connection and Isolation Panel [NHANES]    Frequency of Communication with Friends and Family: More than three times a week    Frequency of Social Gatherings with Friends and Family: More than three times a week    Attends Religious Services: More than 4 times per year    Active Member of [employer] or Organizations: Yes    Attends Engineer, structural: More than 4 times per year    Marital Status: Married    Objective:  BP 120/68   Pulse 70   Temp 97.8 F (36.6 C)   Ht 5\' 8"  (1.727 m)   Wt 221 lb 9.6 oz (100.5 kg)   SpO2 98%   BMI 33.69 kg/m  12/17/2023    8:13 AM 09/14/2023    8:41 AM 08/17/2023    9:40 AM  BP/Weight  Systolic BP 120 126 138  Diastolic BP 68 77 74  Wt. (Lbs) 221.6 217.8 225.8  BMI 33.69 kg/m2 33.12 kg/m2 34.33  kg/m2    Physical Exam Vitals and nursing note reviewed.  HENT:     Head: Normocephalic and atraumatic.  Cardiovascular:     Rate and Rhythm: Normal rate and regular rhythm.  Pulmonary:     Effort: Pulmonary effort is normal.     Breath sounds: Normal breath sounds.  Musculoskeletal:     Comments: MUSCULOSKELETAL: Shoulder crossover normal with full range of motion. Supraspinatus impingement test negative. No posterior shoulder tenderness. Apley scratch test limited on left shoulder. Limited abduction of left shoulder.  Neurological:     General: No focal deficit present.     Mental Status: He is alert.  Psychiatric:        Mood and Affect: Mood normal.     Diabetic Foot Exam - Simple   No data filed      Lab Results  Component Value Date   WBC 4.8 08/05/2023   HGB 14.9 08/05/2023   HCT 45.5 08/05/2023   PLT 164 08/05/2023   GLUCOSE 97 08/05/2023   CHOL 133 08/05/2023   TRIG 63 08/05/2023   HDL 51 08/05/2023   LDLCALC 69 08/05/2023   ALT 26 08/05/2023   AST 38 08/05/2023   NA 140 08/05/2023   K 4.8 08/05/2023   CL 104 08/05/2023   CREATININE 0.79 08/05/2023   BUN 22 08/05/2023   CO2 29 08/05/2023   TSH 2.920 12/17/2022   INR 1.2 12/17/2022   HGBA1C 5.3 06/18/2023      Assessment & Plan:  Assessment and Plan       Primary hypertension Assessment & Plan: Hypertension well controlled with carvedilol 6.25 mg TWICE DAILY  and spironolactone 25 mg once daily. Blood pressure readings are consistently within target range.   Hypercholesterolemia Assessment & Plan: Hyperlipidemia managed with Zetia 10 mg daily and simvastatin 10 mg every other day due to joint pain. Cholesterol levels were good in November 2024. No immediate need for blood work as previous results were normal. - Continue current lipid-lowering regimen - Reassess lipid panel in 2-3 months   Impingement of left shoulder Assessment & Plan: Left Shoulder Pain Chronic left shoulder pain,  previously managed with an injection on Feb 27, 2023. Reports increasing soreness, especially when sleeping on the left side. Pain is not severe enough to warrant another injection at this time. Risks of frequent injections include worsening arthritis. - Encourage shoulder exercises and stretching - Use heat and Tylenol for pain management - Consider shoulder injection if pain becomes severe - Reassess in a few months if pain worsens   Class 1 obesity due to excess calories with serious comorbidity and body mass index (BMI) of 33.0 to 33.9 in adult Assessment & Plan: Recommend to continue to stay active, increase physical activity and exercise and eat healthy   Right carpal tunnel syndrome Assessment & Plan: Right hand carpal tunnel syndrome, previously treated with an injection that alleviated pain but left residual numbness. Not ready for surgery and advised to continue with exercises and use of a brace during activities. - Continue carpal tunnel exercises - Use a brace during activities    Atrial fibrillation, unspecified type [facility]) Assessment & Plan: Chronic atrial fibrillation managed with carvedilol 6.25 mg twice daily and Xarelto  20 mg daily. Blood pressure and heart rate are well controlled. Experiences occasional dizziness, possibly related to spironolactone or carvedilol. - Monitor for dizziness - Consider reducing spironolactone or carvedilol if dizziness becomes frequent      No orders of the defined types were placed in this encounter.   No orders of the defined types were placed in this encounter.    Follow-up: Return in about 3 months (around 03/18/2024).     An After Visit Summary was printed and given to the patient.  Windell Moment, MD [facility] [phone]

## 2023-12-17 NOTE — Assessment & Plan Note (Signed)
 Chronic atrial fibrillation managed with carvedilol 6.25 mg twice daily and Xarelto 20 mg daily. Blood pressure and heart rate are well controlled. Experiences occasional dizziness, possibly related to spironolactone or carvedilol. - Monitor for dizziness - Consider reducing spironolactone or carvedilol if dizziness becomes frequent

## 2023-12-17 NOTE — Assessment & Plan Note (Signed)
 Right hand carpal tunnel syndrome, previously treated with an injection that alleviated pain but left residual numbness. Not ready for surgery and advised to continue with exercises and use of a brace during activities. - Continue carpal tunnel exercises - Use a brace during activities

## 2023-12-22 DIAGNOSIS — D045 Carcinoma in situ of skin of trunk: Secondary | ICD-10-CM | POA: Diagnosis not present

## 2024-01-04 DIAGNOSIS — G5601 Carpal tunnel syndrome, right upper limb: Secondary | ICD-10-CM | POA: Diagnosis not present

## 2024-01-04 DIAGNOSIS — M65342 Trigger finger, left ring finger: Secondary | ICD-10-CM | POA: Diagnosis not present

## 2024-01-04 DIAGNOSIS — M65312 Trigger thumb, left thumb: Secondary | ICD-10-CM | POA: Diagnosis not present

## 2024-01-27 ENCOUNTER — Ambulatory Visit: Payer: Self-pay

## 2024-01-27 ENCOUNTER — Ambulatory Visit

## 2024-01-27 VITALS — BP 122/86 | HR 74 | Temp 97.3°F | Ht 68.0 in | Wt 221.4 lb

## 2024-01-27 DIAGNOSIS — M542 Cervicalgia: Secondary | ICD-10-CM | POA: Diagnosis not present

## 2024-01-27 DIAGNOSIS — M62830 Muscle spasm of back: Secondary | ICD-10-CM | POA: Diagnosis not present

## 2024-01-27 MED ORDER — METHOCARBAMOL 750 MG PO TABS: 750.0000 mg | ORAL_TABLET | Freq: Two times a day (BID) | ORAL | 0 refills | Status: AC | PRN

## 2024-01-27 NOTE — Patient Instructions (Signed)
  VISIT SUMMARY: Today, you were seen for neck pain that started on Sunday and has been gradually improving. We also reviewed your recent skin cancer removal and noted that your chronic leg ulcer has healed completely.  YOUR PLAN: -NECK PAIN AND MUSCLE STRAIN: You have acute neck pain and muscle strain, likely from a strained muscle, possibly due to improper sleeping posture. This is causing pain on the right side of your neck and right trapezius, with limited and painful movement. You are prescribed methocarbamol  to take twice daily, with an option for a third dose if needed, but be cautious as it may cause drowsiness. Continue taking acetaminophen  as needed for pain and perform neck stretches to prevent stiffness.  -SKIN CANCER REMOVAL: You recently had a procedure to remove skin cancer from your left hand and chest wall. The procedure involved shaving off the cancerous tissue, and a second shave was required to remove any remaining cancerous tissue.  -CHRONIC LEG ULCER: Your chronic leg ulcer on the left leg has healed completely, and there is no current skin breakdown.  INSTRUCTIONS: Please follow up as needed if your neck pain does not improve or if you experience any new symptoms. Continue with the prescribed medications and stretches. If you have any concerns about your recent skin cancer removal or notice any changes, please contact us .   Contains text generated by Abridge.         Contains text generated by Abridge.

## 2024-01-27 NOTE — Progress Notes (Signed)
 Acute Office Visit  Subjective:    Patient ID: Ryan Mccall, male    DOB: [birth year], 78 y.o.   MRN: [id number]  Chief Complaint  Patient presents with   Neck Pain    HPI: Discussed the use of AI scribe software for clinical note transcription with the patient, who gave verbal consent to proceed.  History of Present Illness   Ryan Mccall is a 78 year old male who presents with neck pain.  He has been experiencing neck pain that began on Sunday while at church. Initially mild, the pain became more severe by Tuesday and has slightly improved by today, though it remains present. The pain is located on the right side of the neck and radiates to the right trapezius area, described as a pulling sensation when turning his head to either side. He has been managing the pain with Tylenol , which he took this morning, and has also applied heat using a towel heated in the microwave. He has used Voltaren  gel and Biofreeze for relief. Despite these measures, the pain persists, particularly when he attempts to sleep with a pillow, which usually provides relief but has not been effective this time.  He mentions a history of similar symptoms last year, which were treated with a shoulder injection. He is unsure if the current pain is related to that episode. No pain when pressure is applied to the center or left side of the neck. Pain is present on the right trapezius. No cervical tenderness or pain over the C7 prominence. Reports stiffness and pain with neck movements, particularly when turning to the left or extending the neck.  He also reports a recent procedure where a cancerous lesion was shaved off his left hand and chest wall about two to three weeks ago. He notes that there are red areas around his neck, which he initially thought might resemble a rash, but they do not appear to be shingles.       Past Medical History:  Diagnosis Date   Atrial fibrillation [facility])    Coronary artery disease     s/p remote CABG 1999 per Dr. 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   Wears dentures    full upper and lower    Past Surgical History:  Procedure Laterality Date   CATARACT EXTRACTION W/PHACO Right 01/19/2020   Procedure: CATARACT EXTRACTION PHACO AND INTRAOCULAR LENS PLACEMENT (IOC) RIGHT VISION BLUE 8.60  00:58.6;  Surgeon: Ola Berger, MD;  Location: [facility];  Service: Ophthalmology;  Laterality: Right;   CATARACT EXTRACTION W/PHACO Left 01/09/2021   Procedure: CATARACT EXTRACTION PHACO AND INTRAOCULAR LENS PLACEMENT (IOC) LEFT;  Surgeon: Annell Kidney, MD;  Location: [facility];  Service: Ophthalmology;  Laterality: Left;  3.30 0:51.6 6.4%   COLECTOMY WITH COLOSTOMY CREATION/HARTMANN PROCEDURE N/A 07/03/2017   Procedure: COLECTOMY WITH COLOSTOMY CREATION/HARTMANN PROCEDURE;  Surgeon: Claudia Cuff, MD;  Location: [facility];  Service: General;  Laterality: N/A;   COLONOSCOPY WITH PROPOFOL  N/A 10/27/2017   Procedure: COLONOSCOPY WITH PROPOFOL  through colostomy;  Surgeon: Marnee Sink, MD;  Location: [facility];  Service: Endoscopy;  Laterality: N/A;  COLOSTOMY CLOSURE N/A 11/17/2017   Procedure: COLOSTOMY CLOSURE;  Surgeon: Claudia Cuff, MD;  Location: [facility];  Service: General;  Laterality: N/A;   CORONARY ARTERY BYPASS GRAFT  1999   4 vessels   excision of skin cancer     KNEE ARTHROSCOPY W/ MENISCAL REPAIR      Family History  Problem Relation Age of Onset   Lung disease Father 6    Social History   Socioeconomic History   Marital status: Married    Spouse name: Not on file   Number of  children: 2   Years of education: Not on file   Highest education level: Not on file  Occupational History   Occupation: Therapist, art  Tobacco Use   Smoking status: Former    Types: Cigars    Quit date: 09/29/1997    Years since quitting: 26.3   Smokeless tobacco: Former    Types: Chew    Quit date: 1999  Vaping Use   Vaping status: Never Used  Substance and Sexual Activity   Alcohol use: Yes    Alcohol/week: 1.0 standard drink of alcohol    Types: 1 Shots of liquor per week    Comment: one time monthly   Drug use: No   Sexual activity: Yes    Partners: Female  Other Topics Concern   Not on file  Social History Narrative   Not on file   Social Drivers of Health   Financial Resource Strain: Medium Risk (09/14/2023)   Overall Financial Resource Strain (CARDIA)    Difficulty of Paying Living Expenses: Somewhat hard  Food Insecurity: No Food Insecurity (09/14/2023)   Hunger Vital Sign    Worried About Running Out of Food in the Last Year: Never true    Ran Out of Food in the Last Year: Never true  Transportation Needs: No Transportation Needs (09/14/2023)   PRAPARE - Administrator, Civil Service (Medical): No    Lack of Transportation (Non-Medical): No  Physical Activity: Sufficiently Active (09/14/2023)   Exercise Vital Sign    Days of Exercise per Week: 5 days    Minutes of Exercise per Session: 30 min  Stress: No Stress Concern Present (09/14/2023)   [employer] of Occupational Health - Occupational Stress Questionnaire    Feeling of Stress : Not at all  Social Connections: Socially Integrated (09/14/2023)   Social Connection and Isolation Panel [NHANES]    Frequency of Communication with Friends and Family: More than three times a week    Frequency of Social Gatherings with Friends and Family: More than three times a week    Attends Religious Services: More than 4 times per year    Active Member of [employer] or Organizations: Yes     Attends Engineer, structural: More than 4 times per year    Marital Status: Married  Catering manager Violence: Not At Risk (09/14/2023)   Humiliation, Afraid, Rape, and Kick questionnaire    Fear of Current or Ex-Partner: No    Emotionally Abused: No    Physically Abused: No    Sexually Abused: No    Outpatient Medications Prior to Visit  Medication Sig Dispense Refill   carvedilol  (COREG ) 6.25 MG tablet Take 1 tablet (6.25 mg total) by mouth 2 (two) times daily. 180 tablet 3   Cholecalciferol (VITAMIN D) 50 MCG (2000 UT) CAPS Take 1 capsule by mouth daily.     clotrimazole -betamethasone  (LOTRISONE ) cream Apply 2 gr in the affected  area twice a day. 45 g 2   COD LIVER OIL PO Take 1 capsule by mouth daily.     diclofenac  Sodium (VOLTAREN ) 1 % GEL Apply 2 g topically 2 (two) times daily as needed. 1000 g 2   ezetimibe  (ZETIA ) 10 MG tablet Take 1 tablet (10 mg total) by mouth every other day. 90 tablet 1   feeding supplement, ENSURE ENLIVE, (ENSURE ENLIVE) LIQD Take 237 mLs by mouth 2 (two) times daily between meals. (Patient taking differently: Take 237 mLs by mouth daily.) 237 mL 12   fexofenadine (ALLEGRA) 180 MG tablet Take 180 mg by mouth daily as needed for allergies.     nitroGLYCERIN  (NITROSTAT ) 0.4 MG SL tablet PLACE 1 TAB UNDER TONGUE AS NEEDED FOR CHEST PAIN. MAY REPEAT IN 15 MINUTES UP TO 3 TIME-CHEST PAIN. 150 tablet 1   rivaroxaban  (XARELTO ) 20 MG TABS tablet Take 1 tablet (20 mg total) by mouth daily with supper. 30 tablet 6   silver  sulfADIAZINE  (SILVADENE ) 1 % cream Apply 1 application topically daily. 50 g 2   simvastatin  (ZOCOR ) 10 MG tablet Take 1 tablet (10 mg total) by mouth every other day. 90 tablet 1   spironolactone  (ALDACTONE ) 25 MG tablet Take 1 tablet (25 mg total) by mouth daily. 90 tablet 3   vitamin C (ASCORBIC ACID) 500 MG tablet Take 500 mg by mouth 2 (two) times a week.     No facility-administered medications prior to visit.    Allergies   Allergen Reactions   Shellfish Allergy Nausea And Vomiting    Review of Systems  Constitutional:  Negative for chills, fatigue and fever.  HENT:  Negative for congestion, ear pain and sinus pain.   Respiratory:  Negative for cough and shortness of breath.   Cardiovascular:  Negative for chest pain.  Gastrointestinal:  Negative for abdominal pain, constipation, diarrhea, nausea and vomiting.  Musculoskeletal:  Positive for neck pain and neck stiffness. Negative for myalgias.  Neurological:  Negative for headaches.       Objective:        01/27/2024    9:26 AM 12/17/2023    8:13 AM 09/14/2023    8:41 AM  Vitals with BMI  Height 5\' 8"  5\' 8"  5\' 8"   Weight 221 lbs 6 oz 221 lbs 10 oz 217 lbs 13 oz  BMI 33.67 33.7 33.12  Systolic 122 120 161  Diastolic 86 68 77  Pulse 74 70 60    No data found.   Physical Exam Vitals and nursing note reviewed.  Constitutional:      Appearance: He is obese.  Neck:     Comments: NECK: Neck supple with right trapezius tenderness, painful movement to the left, stiff extension, and painful and limited range of motion CHEST: Clear to auscultation bilaterally, no wheezes, rhonchi, or crackles CARDIOVASCULAR: Normal heart rate and rhythm, S1 and S2 normal without murmurs SKIN: Red areas on chest and around neck Neurological:     Mental Status: He is alert.     There are no preventive care reminders to display for this patient.   There are no preventive care reminders to display for this patient.   Lab Results  Component Value Date   TSH 2.920 12/17/2022   Lab Results  Component Value Date   WBC 4.8 08/05/2023   HGB 14.9 08/05/2023   HCT 45.5 08/05/2023   MCV 91 08/05/2023   PLT 164 08/05/2023   Lab Results  Component Value Date   NA  140 08/05/2023   K 4.8 08/05/2023   CO2 29 08/05/2023   GLUCOSE 97 08/05/2023   BUN 22 08/05/2023   CREATININE 0.79 08/05/2023   BILITOT 0.5 08/05/2023   ALKPHOS 58 08/05/2023   AST 38  08/05/2023   ALT 26 08/05/2023   PROT 6.9 08/05/2023   ALBUMIN 4.6 08/05/2023   CALCIUM 9.6 08/05/2023   ANIONGAP 6 11/23/2017   EGFR 92 08/05/2023   GFR 98.27 01/23/2014   Lab Results  Component Value Date   CHOL 133 08/05/2023   Lab Results  Component Value Date   HDL 51 08/05/2023   Lab Results  Component Value Date   LDLCALC 69 08/05/2023   Lab Results  Component Value Date   TRIG 63 08/05/2023   Lab Results  Component Value Date   CHOLHDL 2.6 08/05/2023   Lab Results  Component Value Date   HGBA1C 5.3 06/18/2023       Assessment & Plan:  Neck pain on right side Assessment & Plan: Acute neck pain and muscle strain, likely due to a strained muscle, possibly from improper sleeping posture. Pain is localized to the right side of the neck and right trapezius, with limited and painful range of motion. Symptoms have been present since Sunday and are slightly improving. Differential diagnosis included shingles, but presentation was inconsistent with shingles. - Prescribe methocarbamol , 20 tablets, to be taken twice daily, with an option for a third dose if needed. Caution about potential drowsiness from methocarbamol . - Advise to continue acetaminophen  as needed for pain. - Instruct to perform neck stretches despite pain to prevent stiffness.   Spasm of right trapezius muscle Assessment & Plan: Plan as above     Chronic leg ulcer Chronic leg ulcer on the left leg has healed completely. No current skin breakdown observed.    Other orders -     Methocarbamol ; Take 1 tablet (750 mg total) by mouth every 12 (twelve) hours as needed for up to 10 days for muscle spasms.  Dispense: 20 tablet; Refill: 0   Assessment and Plan           Meds ordered this encounter  Medications   methocarbamol  (ROBAXIN ) 750 MG tablet    Sig: Take 1 tablet (750 mg total) by mouth every 12 (twelve) hours as needed for up to 10 days for muscle spasms.    Dispense:  20 tablet     Refill:  0    No orders of the defined types were placed in this encounter.    Follow-up: No follow-ups on file.  An After Visit Summary was printed and given to the patient.  Ndrew Creason, MD [facility] [phone]

## 2024-01-27 NOTE — Telephone Encounter (Signed)
 Chief Complaint: Neck pain Symptoms: Neck pain down into shoulder on right side Frequency: 2 days Pertinent Negatives: Patient denies chest pain, difficulty breathing, swelling, fever, injury Disposition: [] ED /[] Urgent Care (no appt availability in office) / [x] Appointment(In office/virtual)/ []  [facility]/ [] Home Care/ [] Refused Recommended Disposition /[] [facility]/ []  Follow-up with PCP Additional Notes: Patient called in stating he is experiencing neck pain on the right side, radiating down into his shoulder, for the past 2 days. Patient is uncertain of cause, but denies chest pain, arm pain, difficulty breathing, fever, swelling. Patient states a year ago he had to receive an injection in his shoulder due to arthritis and is curious if that is flaring up again. Patient states the pain gets severe when he turns his head to the right. Patient appt made for further evaluation.    Reason for Disposition  Pain shoots (radiates) into arm or hand    Radiates down into shoulder  Answer Assessment - Initial Assessment Questions 1. ONSET: "When did the pain begin?"      2 day sago 2. LOCATION: "Where does it hurt?"      Right shoulder and runs up neck 3. PATTERN "Does the pain come and go, or has it been constant since it started?"      Comes and goes 4. SEVERITY: "How bad is the pain?"  (Scale 1-10; or mild, moderate, severe)   - NO PAIN (0): no pain or only slight stiffness    - MILD (1-3): doesn't interfere with normal activities    - MODERATE (4-7): interferes with normal activities or awakens from sleep    - SEVERE (8-10):  excruciating pain, unable to do any normal activities      7 5. RADIATION: "Does the pain go anywhere else, shoot into your arms?"     Right neck into shoulder 6. CORD SYMPTOMS: "Any weakness or numbness of the arms or legs?"     No 7. CAUSE: "What do you think is causing the neck pain?"     Patient uncertain if it's arthritis due to having  to have a shot last year for similar feeling 8. NECK OVERUSE: "Any recent activities that involved turning or twisting the neck?"     No 9. OTHER SYMPTOMS: "Do you have any other symptoms?" (e.g., headache, fever, chest pain, difficulty breathing, neck swelling)     No  Protocols used: Neck Pain or Stiffness-A-AH

## 2024-01-27 NOTE — Assessment & Plan Note (Signed)
 Acute neck pain and muscle strain, likely due to a strained muscle, possibly from improper sleeping posture. Pain is localized to the right side of the neck and right trapezius, with limited and painful range of motion. Symptoms have been present since Sunday and are slightly improving. Differential diagnosis included shingles, but presentation was inconsistent with shingles. - Prescribe methocarbamol , 20 tablets, to be taken twice daily, with an option for a third dose if needed. Caution about potential drowsiness from methocarbamol . - Advise to continue acetaminophen  as needed for pain. - Instruct to perform neck stretches despite pain to prevent stiffness.

## 2024-01-27 NOTE — Assessment & Plan Note (Addendum)
 Plan as above     Chronic leg ulcer Chronic leg ulcer on the left leg has healed completely. No current skin breakdown observed.

## 2024-02-04 NOTE — Progress Notes (Signed)
 Ryan Mccall Date of Birth: 1946/01/03 Medical Record #[id number]  History of Present Illness: Ryan Mccall is seen back today for follow up of CAD. He has known CAD, HTN and HLD. He had remote CABG per Dr. Sherene Dilling in 1999 with LIMA to LAD, SVG to 1st DX and SVG to intermediate and OM of the LCX. He did have transient atrial fibrillation during a stress test in 2009. His last stress Myoview  in July 2017showed normal perfusion. He was diagnosed with persistent atrial fibrillation in July 2014. Treated with rate control and anticoagulation.  Echocardiogram showed normal LV function with mild mitral insufficiency and moderate left atrial enlargement.  Previously Irbesartan  was  discontinued due to low BP.   He had a normal Myoview  in May 2025.    On follow up today he is doing well. His BP at home has been well controlled.   He does yard work and mowing a lot. He denies any chest pain, dyspnea, palpitations. He has a little swelling in his legs and some discoloration. Uses compression hose now.   Current Outpatient Medications on File Prior to Visit  Medication Sig Dispense Refill   Cholecalciferol (VITAMIN D) 50 MCG (2000 UT) CAPS Take 1 capsule by mouth daily.     clotrimazole -betamethasone  (LOTRISONE ) cream Apply 2 gr in the affected area twice a day. 45 g 2   COD LIVER OIL PO Take 1 capsule by mouth daily.     diclofenac  Sodium (VOLTAREN ) 1 % GEL Apply 2 g topically 2 (two) times daily as needed. 1000 g 2   feeding supplement, ENSURE ENLIVE, (ENSURE ENLIVE) LIQD Take 237 mLs by mouth 2 (two) times daily between meals. 237 mL 12   fexofenadine (ALLEGRA) 180 MG tablet Take 180 mg by mouth daily as needed for allergies.     silver  sulfADIAZINE  (SILVADENE ) 1 % cream Apply 1 application topically daily. 50 g 2   vitamin C (ASCORBIC ACID) 500 MG tablet Take 500 mg by mouth 2 (two) times a week.     No current facility-administered medications on file prior to visit.    Allergies  Allergen  Reactions   Shellfish Allergy Nausea And Vomiting    Past Medical History:  Diagnosis Date   Atrial fibrillation [facility])    Coronary artery disease    s/p remote CABG 1999 per Dr. 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   Wears dentures    full upper and lower    Past Surgical History:  Procedure Laterality Date   CATARACT EXTRACTION W/PHACO Right 01/19/2020   Procedure: CATARACT EXTRACTION PHACO AND INTRAOCULAR LENS PLACEMENT (IOC) RIGHT VISION BLUE 8.60  00:58.6;  Surgeon: Ola Berger, MD;  [facility];  Service: Ophthalmology;  Laterality: Right;   CATARACT EXTRACTION W/PHACO Left 01/09/2021   Procedure: CATARACT EXTRACTION PHACO AND INTRAOCULAR LENS PLACEMENT (IOC) LEFT;  Surgeon: Annell Kidney, MD;  Location: [facility];  Service: Ophthalmology;  Laterality: Left;  3.30 0:51.6 6.4%   COLECTOMY WITH COLOSTOMY CREATION/HARTMANN PROCEDURE N/A 07/03/2017   Procedure: COLECTOMY WITH COLOSTOMY CREATION/HARTMANN PROCEDURE;  Surgeon: Claudia Cuff, MD;  Location: [facility];  Service:  General;  Laterality: N/A;   COLONOSCOPY WITH PROPOFOL  N/A 10/27/2017   Procedure: COLONOSCOPY WITH PROPOFOL  through colostomy;  Surgeon: Marnee Sink, MD;  Location: [facility];  Service: Endoscopy;  Laterality: N/A;   COLOSTOMY CLOSURE N/A 11/17/2017   Procedure: COLOSTOMY CLOSURE;  Surgeon: Claudia Cuff, MD;  Location: [facility];  Service: General;  Laterality: N/A;   CORONARY ARTERY BYPASS GRAFT  1999   4 vessels   excision of skin cancer     KNEE ARTHROSCOPY W/ MENISCAL REPAIR      Social History   Tobacco Use   Smoking Status Former   Types: Cigars   Quit date: 09/29/1997   Years since quitting: 26.3  Smokeless Tobacco Former   Types: Chew   Quit date: 1999    Social History   Substance and Sexual Activity  Alcohol Use Yes   Alcohol/week: 1.0 standard drink of alcohol   Types: 1 Shots of liquor per week   Comment: one time monthly    Family History  Problem Relation Age of Onset   Lung disease Father 31    Review of Systems: The review of systems is per the HPI.  All other systems were reviewed and are negative.  Physical Exam: BP 124/76   Pulse 72   Ht 5\' 9"  (1.753 m)   Wt 224 lb 9.6 oz (101.9 kg)   SpO2 98%   BMI 33.17 kg/m   GENERAL:  Well appearing WM in NAD HEENT:  PERRL, EOMI, sclera are clear. Oropharynx is clear. NECK:  No jugular venous distention, carotid upstroke brisk and symmetric, no bruits, no thyromegaly or adenopathy LUNGS:  Clear to auscultation bilaterally CHEST:  Unremarkable HEART:  IRRR,  PMI not displaced or sustained,S1 and S2 within normal limits, no S3, no S4: no clicks, no rubs, no murmurs ABD:  Soft, nontender. BS +, no masses or bruits. No hepatomegaly, no splenomegaly EXT:  2 + pulses throughout, Tr edema, no cyanosis no clubbing SKIN:  Warm and dry.  No rashes NEURO:  Alert and oriented x 3. Cranial nerves II through XII intact. PSYCH:  Cognitively intact   LABORATORY DATA:   Lab Results  Component Value Date   WBC 4.8 08/05/2023   HGB 14.9 08/05/2023   HCT 45.5 08/05/2023   PLT 164 08/05/2023   GLUCOSE 97 08/05/2023   CHOL 133 08/05/2023   TRIG 63 08/05/2023   HDL 51 08/05/2023   LDLCALC 69 08/05/2023   ALT 26 08/05/2023   AST 38 08/05/2023   NA 140 08/05/2023   K 4.8 08/05/2023   CL 104 08/05/2023   CREATININE 0.79 08/05/2023   BUN 22 08/05/2023   CO2 29 08/05/2023   TSH 2.920 12/17/2022   INR 1.2 12/17/2022   HGBA1C 5.3 06/18/2023   Labs from 02/18/17:  CBC with platelet count 130K. Aaron Aas Cholesterol 127, triglycerides 79,  LDL 73, HDL 38. A1c 5.6%. CMET normal. Dated 10/28/17: cholesterol 126, triglycerides 91, HDL 38, LDL 66. A1c 5.3%. CBC and CMET normal. Dated 06/02/18: cholesterol 122, triglycerides 64, HDL 45, LDL 64. A1c 5.3%. CBC and CMET normal Dated 10/04/19: A1c 5.4%. cholesterol 116, triglycerides 102, HDL 41, LDL 71. Sodium 132, otherwise CBC and CMET normal. Dated 12/12/21: cholesterol 127, triglycerides 86, HDL 44, LDL 66. CBC and CMET normal       Myoview  04/11/16: Study Highlights   Nuclear stress EF: 54%. The LV systolic function is normal There was no ST segment deviation noted during stress. The study is  normal. no ischemia. No infarction This is a low risk study.     Myoview  02/10/23: Study Highlights      The study is normal. The study is low risk.   No ST deviation was noted.   LV perfusion is normal. There is no evidence of ischemia. There is no evidence of infarction.   Left ventricular function is normal. Nuclear stress EF: 58 %. The left ventricular ejection fraction is normal (55-65%). End diastolic cavity size is normal. End systolic cavity size is normal.   Riccardo Chamberlain, MD  Assessment / Plan: 1. CAD - remote CABG in 1999 - normal Myoview  in May 2024. Patient has class 1  symptoms - stable angina. Continue risk factor modification. Continue Coreg .    2. Atrial fibrillation - permanent. Rate control is excellent on Coreg . He is asymptomatic. Continue anticoagulation with Xarelto . No bleeding. Planning repeat labs with PCP next month.   3. HTN -  Well  Controlled on Coreg  and spironolactone    4. HLD - on statin. Has been well controlled. Now  LDL up to  62-67.  On Zetia  and takes Zocor  every other day due to myalgias.    5. Obesity.encouraged weight loss    Plan I will followup again in 6 months

## 2024-02-08 ENCOUNTER — Encounter: Payer: Self-pay | Admitting: Cardiology

## 2024-02-08 ENCOUNTER — Ambulatory Visit: Payer: PPO | Attending: Cardiology | Admitting: Cardiology

## 2024-02-08 DIAGNOSIS — I4811 Longstanding persistent atrial fibrillation: Secondary | ICD-10-CM | POA: Diagnosis not present

## 2024-02-08 DIAGNOSIS — I25708 Atherosclerosis of coronary artery bypass graft(s), unspecified, with other forms of angina pectoris: Secondary | ICD-10-CM

## 2024-02-08 MED ORDER — NITROGLYCERIN 0.4 MG SL SUBL: 0.4000 mg | SUBLINGUAL_TABLET | SUBLINGUAL | 6 refills | Status: AC | PRN

## 2024-02-08 MED ORDER — SPIRONOLACTONE 25 MG PO TABS: 25.0000 mg | ORAL_TABLET | Freq: Every day | ORAL | 3 refills | Status: AC

## 2024-02-08 MED ORDER — EZETIMIBE 10 MG PO TABS: 10.0000 mg | ORAL_TABLET | ORAL | 3 refills | Status: AC

## 2024-02-08 MED ORDER — CARVEDILOL 6.25 MG PO TABS: 6.2500 mg | ORAL_TABLET | Freq: Two times a day (BID) | ORAL | 3 refills | Status: AC

## 2024-02-08 MED ORDER — SIMVASTATIN 10 MG PO TABS: 10.0000 mg | ORAL_TABLET | ORAL | 3 refills | Status: AC

## 2024-02-08 MED ORDER — RIVAROXABAN 20 MG PO TABS: 20.0000 mg | ORAL_TABLET | Freq: Every day | ORAL | 3 refills | Status: AC

## 2024-02-08 NOTE — Patient Instructions (Signed)

## 2024-03-22 ENCOUNTER — Ambulatory Visit (INDEPENDENT_AMBULATORY_CARE_PROVIDER_SITE_OTHER)

## 2024-03-22 VITALS — BP 128/86 | HR 62 | Temp 97.9°F | Ht 69.0 in | Wt 218.6 lb

## 2024-03-22 DIAGNOSIS — M25512 Pain in left shoulder: Secondary | ICD-10-CM | POA: Diagnosis not present

## 2024-03-22 DIAGNOSIS — G8929 Other chronic pain: Secondary | ICD-10-CM | POA: Diagnosis not present

## 2024-03-22 DIAGNOSIS — E7800 Pure hypercholesterolemia, unspecified: Secondary | ICD-10-CM | POA: Diagnosis not present

## 2024-03-22 DIAGNOSIS — Z8679 Personal history of other diseases of the circulatory system: Secondary | ICD-10-CM | POA: Insufficient documentation

## 2024-03-22 DIAGNOSIS — M25812 Other specified joint disorders, left shoulder: Secondary | ICD-10-CM

## 2024-03-22 DIAGNOSIS — I10 Essential (primary) hypertension: Secondary | ICD-10-CM | POA: Diagnosis not present

## 2024-03-22 MED ORDER — TRIAMCINOLONE ACETONIDE 40 MG/ML IJ SUSP: 40.0000 mg | Freq: Once | INTRAMUSCULAR | Status: DC

## 2024-03-22 MED ORDER — DICLOFENAC SODIUM 1 % EX GEL: 2.0000 g | Freq: Two times a day (BID) | CUTANEOUS | 2 refills | Status: AC | PRN

## 2024-03-22 NOTE — Assessment & Plan Note (Signed)
 Chronic hyperlipidemia managed with simvastatin  10 mg daily and Zetia  10 mg daily. Previous blood work showed normal cholesterol levels.

## 2024-03-22 NOTE — Patient Instructions (Signed)
  VISIT SUMMARY: You visited us  today due to worsening pain in your left shoulder, which is affecting your sleep. We administered a steroid injection to help alleviate the pain and discussed your ongoing management for atrial fibrillation and hyperlipidemia.  YOUR PLAN: LEFT SHOULDER PAIN: You have chronic pain in your left shoulder that has recently worsened and is disturbing your sleep. -We administered a steroid injection (Kenalog  40 mg with 4 ml lidocaine  without epinephrine ) into your left shoulder. -Please rest your left shoulder for a couple of days after the injection. -We have refilled your Voltaren  gel for topical use.  ATRIAL FIBRILLATION: You have chronic atrial fibrillation, which is being managed with Xarelto  and carvedilol . -Continue taking Xarelto  20 mg daily for anticoagulation. -Continue taking carvedilol  6.25 mg twice daily for heart rate control.  HYPERLIPIDEMIA: You have high cholesterol, which is being managed with simvastatin  and Zetia . -Continue taking simvastatin  10 mg daily. -Continue taking Zetia  10 mg daily.  GENERAL HEALTH MAINTENANCE: You are a former smoker and engage in regular physical activity. You consume alcohol sparingly. -We have ordered blood work to check your kidney and liver function, blood sugars, and cholesterol levels. -Continue to maintain a healthy diet and regular exercise.                      Contains text generated by Abridge.                                 Contains text generated by Abridge.

## 2024-03-22 NOTE — Assessment & Plan Note (Addendum)
 Chronic atrial fibrillation managed with Xarelto  20 mg daily for anticoagulation and carvedilol  6.25 mg twice daily for rate control. Heart sounds regular on examination.     General Health Maintenance Former smoker, quit in 1999. Engages in physical activity through mowing and other household tasks. Consumes alcohol sparingly. Last blood work was in November 2024. - Order blood work to check kidney and liver function, blood sugars, and cholesterol panel. - Advise to maintain a healthy diet and regular exercise.

## 2024-03-22 NOTE — Progress Notes (Signed)
 Subjective:  Patient ID: Ryan Mccall, male    DOB: 01-Mar-1946  Age: 78 y.o. MRN: [id number]  Chief Complaint  Patient presents with   Medical Management of Chronic Issues    HPI:  Discussed the use of AI scribe software for clinical note transcription with the patient, who gave verbal consent to proceed.  History of Present Illness   Ryan Mccall is a 78 year old male who presents with left shoulder pain.  He experiences pain in his left shoulder, which has been worsening and now disrupts his sleep. The pain extends up to his neck, particularly affecting the trapezius muscle, with maximum intensity in the shoulder area. He recalls receiving an injection in the shoulder over a year ago, which provided relief at the time.  He is currently taking Xarelto  20 mg daily for atrial fibrillation, carvedilol  6.25 mg twice daily for heart rate control, simvastatin  10 mg daily, and Zetia  10 mg daily for cholesterol management. He also uses Voltaren  gel for pain relief.  His past medical history includes a colectomy with colostomy creation and subsequent reversal in 2018 due to a blockage, not colon cancer. He had cataract surgery three years ago.  He quit smoking in 1999 and reports very occasional alcohol consumption, about a couple of times a week. In terms of physical activity, he engages in mowing and other activities around the house, indicating a level of regular movement and exercise. He is trying to eat healthily and maintains a record of his daily blood pressure and weight. No falls in the last several months, no significant issues with blood pressure, and a low depression score.         03/22/2024    8:07 AM 01/27/2024    9:29 AM 12/17/2023    8:18 AM 09/14/2023    8:45 AM 06/18/2023    8:30 AM  Depression screen PHQ 2/9  Decreased Interest 0 0 0 0 0  Down, Depressed, Hopeless 0 0 0 0 0  PHQ - 2 Score 0 0 0 0 0  Altered sleeping 0   0 0  Tired, decreased energy 1   0 1  Change in  appetite 0   0 1  Feeling bad or failure about yourself  0   0 0  Trouble concentrating 0   0 0  Moving slowly or fidgety/restless 0   0 0  Suicidal thoughts 0   0 0  PHQ-9 Score 1   0 2  Difficult doing work/chores Not difficult at all   Not difficult at all Not difficult at all        03/22/2024    8:07 AM  Fall Risk   Falls in the past year? 0  Number falls in past yr: 0  Injury with Fall? 0  Risk for fall due to : No Fall Risks    Patient Care Team: Aryianna Earwood, MD as PCP - General (Family Medicine) [place], [name] M, MD as PCP - Cardiology (Cardiology) Mittie Gaskin, MD as Referring Physician (Ophthalmology)   Review of Systems  Constitutional:  Negative for chills, fatigue and fever.  HENT:  Negative for congestion, ear pain, sinus pressure and sore throat.   Respiratory:  Negative for cough and shortness of breath.   Cardiovascular:  Negative for chest pain.  Gastrointestinal:  Negative for abdominal pain, constipation, diarrhea, nausea and vomiting.  Genitourinary:  Negative for dysuria and frequency.  Musculoskeletal:  Positive for arthralgias (bilateral shoulder pain, left worse than  right). Negative for back pain and myalgias.  Neurological:  Negative for dizziness and headaches.  Psychiatric/Behavioral:  Negative for dysphoric mood. The patient is not nervous/anxious.     Current Outpatient Medications on File Prior to Visit  Medication Sig Dispense Refill   carvedilol  (COREG ) 6.25 MG tablet Take 1 tablet (6.25 mg total) by mouth 2 (two) times daily. 180 tablet 3   Cholecalciferol (VITAMIN D) 50 MCG (2000 UT) CAPS Take 1 capsule by mouth daily.     clotrimazole -betamethasone  (LOTRISONE ) cream Apply 2 gr in the affected area twice a day. 45 g 2   COD LIVER OIL PO Take 1 capsule by mouth daily.     ezetimibe  (ZETIA ) 10 MG tablet Take 1 tablet (10 mg total) by mouth every other day. 90 tablet 3   feeding supplement, ENSURE ENLIVE, (ENSURE ENLIVE) LIQD Take  237 mLs by mouth 2 (two) times daily between meals. 237 mL 12   fexofenadine (ALLEGRA) 180 MG tablet Take 180 mg by mouth daily as needed for allergies.     nitroGLYCERIN  (NITROSTAT ) 0.4 MG SL tablet Place 1 tablet (0.4 mg total) under the tongue every 5 (five) minutes as needed for chest pain. 25 tablet 6   rivaroxaban  (XARELTO ) 20 MG TABS tablet Take 1 tablet (20 mg total) by mouth daily with supper. 90 tablet 3   silver  sulfADIAZINE  (SILVADENE ) 1 % cream Apply 1 application topically daily. 50 g 2   simvastatin  (ZOCOR ) 10 MG tablet Take 1 tablet (10 mg total) by mouth every other day. 90 tablet 3   spironolactone  (ALDACTONE ) 25 MG tablet Take 1 tablet (25 mg total) by mouth daily. 90 tablet 3   vitamin C (ASCORBIC ACID) 500 MG tablet Take 500 mg by mouth 2 (two) times a week.     No current facility-administered medications on file prior to visit.   Past Medical History:  Diagnosis Date   Atrial fibrillation [facility])    Coronary artery disease    s/p remote CABG 1999 per Dr. 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   Wears dentures    full upper and lower   Past Surgical History:  Procedure Laterality Date   CATARACT EXTRACTION W/PHACO Right 01/19/2020   Procedure: CATARACT EXTRACTION PHACO AND INTRAOCULAR LENS PLACEMENT (IOC) RIGHT VISION BLUE 8.60  00:58.6;  Surgeon: Ferol Rogue, MD;  Location: [facility];  Service: Ophthalmology;  Laterality: Right;   CATARACT EXTRACTION W/PHACO Left 01/09/2021   Procedure: CATARACT EXTRACTION PHACO AND INTRAOCULAR LENS PLACEMENT (IOC) LEFT;  Surgeon: Mittie Gaskin, MD;  Location: [facility];  Service: Ophthalmology;  Laterality: Left;  3.30 0:51.6 6.4%   COLECTOMY WITH COLOSTOMY CREATION/HARTMANN PROCEDURE N/A 07/03/2017   Procedure: COLECTOMY WITH COLOSTOMY CREATION/HARTMANN PROCEDURE;  Surgeon: Wonda Charlie BRAVO, MD;  Location: [facility] ORS;  Service: General;  Laterality: N/A;   COLONOSCOPY WITH PROPOFOL  N/A 10/27/2017   Procedure: COLONOSCOPY WITH PROPOFOL  through colostomy;  Surgeon: Jinny Carmine, MD;  Location: [facility] END[facility]COPY;  Service: Endoscopy;  Laterality: N/A;   COLOSTOMY CLOSURE N/A 11/17/2017   Procedure: COLOSTOMY CLOSURE;  Surgeon: Wonda Charlie BRAVO, MD;  Location: [facility] ORS;  Service: General;  Laterality: N/A;   CORONARY ARTERY BYPASS GRAFT  1999   4 vessels   excision of skin cancer     KNEE ARTHROSCOPY W/ MENISCAL REPAIR      Family History  Problem Relation Age of Onset   Lung disease Father 54   Social History   Socioeconomic History   Marital status: Married    Spouse name: Not on file   Number of children: 2   Years of education: Not on file   Highest education level: Not on file  Occupational History   Occupation: Therapist, art  Tobacco Use   Smoking status: Former    Types: Cigars    Quit date: 09/29/1997    Years since quitting: 26.4   Smokeless tobacco: Former    Types: Chew    Quit date: 1999  Vaping Use   Vaping status: Never Used  Substance and Sexual Activity   Alcohol use: Yes    Alcohol/week: 1.0 standard drink of alcohol    Types: 1 Shots of liquor per week    Comment: one time monthly   Drug use: No   Sexual activity: Yes    Partners: Female  Other Topics Concern   Not on file  Social History Narrative   Not on file   Social Drivers of Health   Financial Resource Strain: Medium Risk (09/14/2023)   Overall Financial Resource Strain (CARDIA)    Difficulty of Paying Living Expenses: Somewhat hard  Food Insecurity: No Food Insecurity (09/14/2023)   Hunger Vital Sign    Worried About Running Out of  Food in the Last Year: Never true    Ran Out of Food in the Last Year: Never true  Transportation Needs: No Transportation Needs (09/14/2023)   PRAPARE - Administrator, Civil Service (Medical): No    Lack of Transportation (Non-Medical): No  Physical Activity: Sufficiently Active (09/14/2023)   Exercise Vital Sign    Days of Exercise per Week: 5 days    Minutes of Exercise per Session: 30 min  Stress: No Stress Concern Present (09/14/2023)   [employer] of Occupational Health - Occupational Stress Questionnaire    Feeling of Stress : Not at all  Social Connections: Socially Integrated (09/14/2023)   Social Connection and Isolation Panel    Frequency of Communication with Friends and Family: More than three times a week    Frequency of Social Gatherings with Friends and Family: More than three times a week    Attends Religious Services: More than 4 times per year    Active Member of [employer] or Organizations: Yes    Attends Engineer, structural: More than 4 times per year    Marital Status: Married    Objective:  BP 128/86   Pulse 62   Temp 97.9 F (36.6 C)   Ht 5' 9 (1.753 m)   Wt 218 lb 9.6 oz (99.2 kg)   SpO2 98%   BMI 32.28 kg/m      03/22/2024    8:03 AM 02/08/2024    8:02 AM 01/27/2024    9:26 AM  BP/Weight  Systolic BP 128 124 122  Diastolic BP 86 76 86  Wt. (Lbs) 218.6 224.6 221.4  BMI 32.28 kg/m2 33.17 kg/m2 33.66 kg/m2    Physical Exam Vitals and nursing note reviewed.  Constitutional:      Appearance: He is obese.  HENT:     Head: Normocephalic and atraumatic.   Eyes:  Pupils: Pupils are equal, round, and reactive to [place].   Neck:     Comments: Mild left trapezius tightness and tenderness Cardiovascular:     Rate and Rhythm: Normal rate and regular rhythm.  Pulmonary:     Effort: Pulmonary effort is normal.     Breath sounds: Normal breath sounds.   Musculoskeletal:     Cervical back: Normal range of motion.      Comments: Right shoulder drooping, left shoulder slightly higher. Slight trapezius tightness on the left. No superior, anterior, posterior shoulder, or deltoid tenderness. Abduction painful on the left. Subscapularis lift off painful. Weakness with supraspinatus test.   Skin:    General: Skin is warm.   Neurological:     General: No focal deficit present.     Mental Status: He is alert and oriented to person, place, and time.   Psychiatric:        Mood and Affect: Mood normal.        Behavior: Behavior normal.         Lab Results  Component Value Date   WBC 4.8 08/05/2023   HGB 14.9 08/05/2023   HCT 45.5 08/05/2023   PLT 164 08/05/2023   GLUCOSE 97 08/05/2023   CHOL 133 08/05/2023   TRIG 63 08/05/2023   HDL 51 08/05/2023   LDLCALC 69 08/05/2023   ALT 26 08/05/2023   AST 38 08/05/2023   NA 140 08/05/2023   K 4.8 08/05/2023   CL 104 08/05/2023   CREATININE 0.79 08/05/2023   BUN 22 08/05/2023   CO2 29 08/05/2023   TSH 2.920 12/17/2022   INR 1.2 12/17/2022   HGBA1C 5.3 06/18/2023      Assessment & Plan:  Hypercholesterolemia Assessment & Plan: Chronic hyperlipidemia managed with simvastatin  10 mg daily and Zetia  10 mg daily. Previous blood work showed normal cholesterol levels.  Orders: -     Lipid panel  Chronic left shoulder pain Assessment & Plan: Due to chronic pain, restricted ROM and abnormal rotator cuff testing, suspect shoulder impingement. Patient had last X rays back in 2015.  Offered to repeat X rays but he declined.  He was insistent on receiving a steroid injection into the left shoulder as it helped for almost a year the last time.  LEFT SHOULDER STEROID INJECTION GIVEN TODAY   Primary hypertension -     Comprehensive metabolic panel with GFR  Impingement of left shoulder Assessment & Plan: Chronic left shoulder pain with exacerbation, disturbing sleep. Previous steroid injection over a year ago provided relief. Examination reveals  slight trapezius tightness on the left, painful abduction, and subscapularis lift-off pain. No superior, anterior, posterior, or deltoid tenderness. Weakness with supraspinatus test. Declined x-ray due to personal commitments. Informed about risks of repeated steroid injections, including potential weakening of ligaments. Advised that injections can provide temporary relief but may not be a long-term solution if surgery is not considered. - Administer steroid injection (Kenalog  40 mg with 4 ml lidocaine  without epinephrine ) into the left shoulder using a posterior approach. - Advise to rest the left shoulder for a couple of days post-injection. - Refill Voltaren  gel for topical use.  Orders: -     Triamcinolone  Acetonide  Atrial fibrillation, currently in sinus rhythm Assessment & Plan: Chronic atrial fibrillation managed with Xarelto  20 mg daily for anticoagulation and carvedilol  6.25 mg twice daily for rate control. Heart sounds regular on examination.     General Health Maintenance Former smoker, quit in 1999. Engages in physical activity  through mowing and other household tasks. Consumes alcohol sparingly. Last blood work was in November 2024. - Order blood work to check kidney and liver function, blood sugars, and cholesterol panel. - Advise to maintain a healthy diet and regular exercise.    Other orders -     Diclofenac  Sodium; Apply 2 g topically 2 (two) times daily as needed.  Dispense: 1000 g; Refill: 2 -     Arthrocentesis   Joint Injection/Arthrocentesis  Date/Time: 03/22/2024 10:10 AM  Performed by: Leisel Pinette, MD Authorized by: Jonathandavid Marlett, MD  Indications: pain  Body area: shoulder Joint: left shoulder Local anesthesia used: yes  Anesthesia: Local anesthesia used: yes Local Anesthetic: lidocaine  1% without epinephrine   Sedation: Patient sedated: no  Preparation: Patient was prepped and draped in the usual sterile fashion. Needle size: 22  G Ultrasound guidance: no Approach: posterior Triamcinolone  amount: 40 mg Lidocaine  1% amount: 4 mL Patient tolerance: patient tolerated the procedure well with no immediate complications     Assessment and Pl           Meds ordered this encounter  Medications   diclofenac  Sodium (VOLTAREN ) 1 % GEL    Sig: Apply 2 g topically 2 (two) times daily as needed.    Dispense:  1000 g    Refill:  2   triamcinolone  acetonide (KENALOG -40) injection 40 mg    Orders Placed This Encounter  Procedures   Joint Injection/Arthrocentesis   Comprehensive metabolic panel with GFR   Lipid Panel     Follow-up: Return in about 6 months (around 09/21/2024) for chronic disease follow up.     An After Visit Summary was printed and given to the patient.  Ryan Sweeden, MD [facility] [phone]

## 2024-03-22 NOTE — Assessment & Plan Note (Signed)
 Due to chronic pain, restricted ROM and abnormal rotator cuff testing, suspect shoulder impingement. Patient had last X rays back in 2015.  Offered to repeat X rays but he declined.  He was insistent on receiving a steroid injection into the left shoulder as it helped for almost a year the last time.  LEFT SHOULDER STEROID INJECTION GIVEN TODAY

## 2024-03-22 NOTE — Assessment & Plan Note (Signed)
 Chronic left shoulder pain with exacerbation, disturbing sleep. Previous steroid injection over a year ago provided relief. Examination reveals slight trapezius tightness on the left, painful abduction, and subscapularis lift-off pain. No superior, anterior, posterior, or deltoid tenderness. Weakness with supraspinatus test. Declined x-ray due to personal commitments. Informed about risks of repeated steroid injections, including potential weakening of ligaments. Advised that injections can provide temporary relief but may not be a long-term solution if surgery is not considered. - Administer steroid injection (Kenalog  40 mg with 4 ml lidocaine  without epinephrine ) into the left shoulder using a posterior approach. - Advise to rest the left shoulder for a couple of days post-injection. - Refill Voltaren  gel for topical use.

## 2024-03-23 LAB — COMPREHENSIVE METABOLIC PANEL WITH GFR
ALT: 30 IU/L (ref 0–44)
AST: 37 IU/L (ref 0–40)
Albumin: 4.6 g/dL (ref 3.8–4.8)
Alkaline Phosphatase: 62 IU/L (ref 44–121)
BUN/Creatinine Ratio: 31 — ABNORMAL HIGH (ref 10–24)
BUN: 27 mg/dL (ref 8–27)
Bilirubin Total: 0.9 mg/dL (ref 0.0–1.2)
CO2: 20 mmol/L (ref 20–29)
Calcium: 9.5 mg/dL (ref 8.6–10.2)
Chloride: 104 mmol/L (ref 96–106)
Creatinine, Ser: 0.88 mg/dL (ref 0.76–1.27)
Globulin, Total: 2.2 g/dL (ref 1.5–4.5)
Glucose: 102 mg/dL — ABNORMAL HIGH (ref 70–99)
Potassium: 4.6 mmol/L (ref 3.5–5.2)
Sodium: 142 mmol/L (ref 134–144)
Total Protein: 6.8 g/dL (ref 6.0–8.5)
eGFR: 89 mL/min/{1.73_m2} (ref >59)

## 2024-03-23 LAB — LIPID PANEL
Chol/HDL Ratio: 2.7 ratio (ref 0.0–5.0)
Cholesterol, Total: 122 mg/dL (ref 100–199)
HDL: 46 mg/dL (ref >39)
LDL Chol Calc (NIH): 63 mg/dL (ref 0–99)
Triglycerides: 58 mg/dL (ref 0–149)
VLDL Cholesterol Cal: 13 mg/dL (ref 5–40)

## 2024-03-24 ENCOUNTER — Ambulatory Visit: Payer: Self-pay

## 2024-08-03 NOTE — Progress Notes (Signed)
 Ryan Mccall Date of Birth: 1946/01/18 Medical Record #[id number]  History of Present Illness: Ryan Mccall is seen back today for follow up of CAD. He has known CAD, HTN and HLD. He had remote CABG per Dr. Lucas in 1999 with LIMA to LAD, SVG to 1st DX and SVG to intermediate and OM of the LCX. He did have transient atrial fibrillation during a stress test in 2009. His last stress Myoview  in July 2017showed normal perfusion. He was diagnosed with persistent atrial fibrillation in July 2014. Treated with rate control and anticoagulation.  Echocardiogram showed normal LV function with mild mitral insufficiency and moderate left atrial enlargement.  Previously Irbesartan  was  discontinued due to low BP.   He had a normal Myoview  in May 2025.    On follow up today he is doing well. His BP at home has been well controlled.   He does yard work and mowing. He denies any chest pain, dyspnea, palpitations.  He has lost 8 lbs.   Current Outpatient Medications on File Prior to Visit  Medication Sig Dispense Refill   carvedilol  (COREG ) 6.25 MG tablet Take 1 tablet (6.25 mg total) by mouth 2 (two) times daily. 180 tablet 3   Cholecalciferol (VITAMIN D) 50 MCG (2000 UT) CAPS Take 1 capsule by mouth daily.     clotrimazole -betamethasone  (LOTRISONE ) cream Apply 2 gr in the affected area twice a day. 45 g 2   COD LIVER OIL PO Take 1 capsule by mouth daily.     diclofenac  Sodium (VOLTAREN ) 1 % GEL Apply 2 g topically 2 (two) times daily as needed. 1000 g 2   ezetimibe  (ZETIA ) 10 MG tablet Take 1 tablet (10 mg total) by mouth every other day. 90 tablet 3   feeding supplement, ENSURE ENLIVE, (ENSURE ENLIVE) LIQD Take 237 mLs by mouth 2 (two) times daily between meals. 237 mL 12   fexofenadine (ALLEGRA) 180 MG tablet Take 180 mg by mouth daily as needed for allergies.     nitroGLYCERIN  (NITROSTAT ) 0.4 MG SL tablet Place 1 tablet (0.4 mg total) under the tongue every 5 (five) minutes as needed for chest pain. 25  tablet 6   rivaroxaban  (XARELTO ) 20 MG TABS tablet Take 1 tablet (20 mg total) by mouth daily with supper. 90 tablet 3   silver  sulfADIAZINE  (SILVADENE ) 1 % cream Apply 1 application topically daily. 50 g 2   simvastatin  (ZOCOR ) 10 MG tablet Take 1 tablet (10 mg total) by mouth every other day. 90 tablet 3   spironolactone  (ALDACTONE ) 25 MG tablet Take 1 tablet (25 mg total) by mouth daily. 90 tablet 3   vitamin C (ASCORBIC ACID) 500 MG tablet Take 500 mg by mouth 2 (two) times a week.     No current facility-administered medications on file prior to visit.    Allergies  Allergen Reactions   Shellfish Allergy Nausea And Vomiting    Past Medical History:  Diagnosis Date   Atrial fibrillation [facility])    Coronary artery disease    s/p remote CABG 1999 per Dr. 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   Wears dentures  full upper and lower    Past Surgical History:  Procedure Laterality Date   CATARACT EXTRACTION W/PHACO Right 01/19/2020   Procedure: CATARACT EXTRACTION PHACO AND INTRAOCULAR LENS PLACEMENT (IOC) RIGHT VISION BLUE 8.60  00:58.6;  Surgeon: Ferol Rogue, MD;  Location: [facility];  Service: Ophthalmology;  Laterality: Right;   CATARACT EXTRACTION W/PHACO Left 01/09/2021   Procedure: CATARACT EXTRACTION PHACO AND INTRAOCULAR LENS PLACEMENT (IOC) LEFT;  Surgeon: Mittie Gaskin, MD;  Location: [facility];  Service: Ophthalmology;  Laterality: Left;  3.30 0:51.6 6.4%   COLECTOMY WITH COLOSTOMY CREATION/HARTMANN PROCEDURE N/A 07/03/2017   Procedure: COLECTOMY WITH COLOSTOMY CREATION/HARTMANN PROCEDURE;   Surgeon: Wonda Charlie BRAVO, MD;  Location: [facility];  Service: General;  Laterality: N/A;   COLONOSCOPY WITH PROPOFOL  N/A 10/27/2017   Procedure: COLONOSCOPY WITH PROPOFOL  through colostomy;  Surgeon: Jinny Carmine, MD;  Location: [facility];  Service: Endoscopy;  Laterality: N/A;   COLOSTOMY CLOSURE N/A 11/17/2017   Procedure: COLOSTOMY CLOSURE;  Surgeon: Wonda Charlie BRAVO, MD;  Location: [facility];  Service: General;  Laterality: N/A;   CORONARY ARTERY BYPASS GRAFT  1999   4 vessels   excision of skin cancer     KNEE ARTHROSCOPY W/ MENISCAL REPAIR      Social History   Tobacco Use  Smoking Status Former   Types: Cigars   Quit date: 09/29/1997   Years since quitting: 26.8  Smokeless Tobacco Former   Types: Chew   Quit date: 1999    Social History   Substance and Sexual Activity  Alcohol Use Yes   Alcohol/week: 1.0 standard drink of alcohol   Types: 1 Shots of liquor per week   Comment: one time monthly    Family History  Problem Relation Age of Onset   Lung disease Father 14    Review of Systems: The review of systems is per the HPI.  All other systems were reviewed and are negative.  Physical Exam: BP 126/76   Pulse 68   Ht 5' 9 (1.753 m)   Wt 216 lb (98 kg)   SpO2 95%   BMI 31.90 kg/m   GENERAL:  Well appearing WM in NAD HEENT:  PERRL, EOMI, sclera are clear. Oropharynx is clear. NECK:  No jugular venous distention, carotid upstroke brisk and symmetric, no bruits, no thyromegaly or adenopathy LUNGS:  Clear to auscultation bilaterally CHEST:  Unremarkable HEART:  IRRR,  PMI not displaced or sustained,S1 and S2 within normal limits, no S3, no S4: no clicks, no rubs, no murmurs ABD:  Soft, nontender. BS +, no masses or bruits. No hepatomegaly, no splenomegaly EXT:  2 + pulses throughout, Tr edema, no cyanosis no clubbing SKIN:  Warm and dry.  No rashes NEURO:  Alert and oriented x 3. Cranial nerves II through XII intact. PSYCH:  Cognitively  intact   LABORATORY DATA:   Lab Results  Component Value Date   WBC 4.8 08/05/2023   HGB 14.9 08/05/2023   HCT 45.5 08/05/2023   PLT 164 08/05/2023   GLUCOSE 102 (H) 03/22/2024   CHOL 122 03/22/2024   TRIG 58 03/22/2024   HDL 46 03/22/2024   LDLCALC 63 03/22/2024   ALT 30 03/22/2024   AST 37 03/22/2024   NA 142 03/22/2024   K 4.6 03/22/2024   CL 104 03/22/2024   CREATININE 0.88 03/22/2024   BUN 27 03/22/2024   CO2 20 03/22/2024   TSH 2.920 12/17/2022   INR 1.2 12/17/2022   HGBA1C 5.3 06/18/2023  Labs from 02/18/17:  CBC with platelet count 130K. SABRA Cholesterol 127, triglycerides 79, LDL 73, HDL 38. A1c 5.6%. CMET normal. Dated 10/28/17: cholesterol 126, triglycerides 91, HDL 38, LDL 66. A1c 5.3%. CBC and CMET normal. Dated 06/02/18: cholesterol 122, triglycerides 64, HDL 45, LDL 64. A1c 5.3%. CBC and CMET normal Dated 10/04/19: A1c 5.4%. cholesterol 116, triglycerides 102, HDL 41, LDL 71. Sodium 132, otherwise CBC and CMET normal. Dated 12/12/21: cholesterol 127, triglycerides 86, HDL 44, LDL 66. CBC and CMET normal  EKG Interpretation Date/Time:  Monday August 08 2024 07:55:04 EST Ventricular Rate:  68 PR Interval:    QRS Duration:  92 QT Interval:  420 QTC Calculation: 446 R Axis:   72  Text Interpretation: Atrial fibrillation When compared with ECG of 12-Aug-2023 08:05, No significant change was found Confirmed by Jaslen Adcox [phone]) on 08/08/2024 8:02:14 AM  EKG Interpretation Date/Time:  Monday August 08 2024 07:55:04 EST Ventricular Rate:  68 PR Interval:    QRS Duration:  92 QT Interval:  420 QTC Calculation: 446 R Axis:   72  Text Interpretation: Atrial fibrillation When compared with ECG of 12-Aug-2023 08:05, No significant change was found Confirmed by Tammi Boulier [phone]) on 08/08/2024 8:02:14 AM    Myoview  04/11/16: Study Highlights   Nuclear stress EF: 54%. The LV systolic function is normal There was no ST segment deviation noted during  stress. The study is normal. no ischemia. No infarction This is a low risk study.     Myoview  02/10/23: Study Highlights      The study is normal. The study is low risk.   No ST deviation was noted.   LV perfusion is normal. There is no evidence of ischemia. There is no evidence of infarction.   Left ventricular function is normal. Nuclear stress EF: 58 %. The left ventricular ejection fraction is normal (55-65%). End diastolic cavity size is normal. End systolic cavity size is normal.   Powell Sorrow, MD  Assessment / Plan: 1. CAD - remote CABG in 1999 - normal Myoview  in May 2024. Patient has class 1  symptoms - stable angina. Continue risk factor modification. Continue Coreg .    2. Atrial fibrillation - permanent. Rate control is excellent on Coreg . He is asymptomatic. Continue anticoagulation with Xarelto . No bleeding.   3. HTN -  Well  Controlled on Coreg  and spironolactone    4. HLD - on statin. Has been well controlled. LDL 63.   On Zetia  and takes Zocor  every other day due to myalgias.    5. Obesity.encouraged weight loss    Plan follow up in 6 months

## 2024-08-08 ENCOUNTER — Encounter: Payer: Self-pay | Admitting: Cardiology

## 2024-08-08 ENCOUNTER — Ambulatory Visit: Attending: Cardiology | Admitting: Cardiology

## 2024-08-08 VITALS — BP 126/76 | HR 68 | Ht 69.0 in | Wt 216.0 lb

## 2024-08-08 DIAGNOSIS — I4811 Longstanding persistent atrial fibrillation: Secondary | ICD-10-CM | POA: Diagnosis not present

## 2024-08-08 DIAGNOSIS — I25708 Atherosclerosis of coronary artery bypass graft(s), unspecified, with other forms of angina pectoris: Secondary | ICD-10-CM | POA: Diagnosis not present

## 2024-08-08 DIAGNOSIS — E7800 Pure hypercholesterolemia, unspecified: Secondary | ICD-10-CM

## 2024-08-08 DIAGNOSIS — I10 Essential (primary) hypertension: Secondary | ICD-10-CM

## 2024-08-08 NOTE — Patient Instructions (Signed)
 Medication Instructions:  Continue same medications *If you need a refill on your cardiac medications before your next appointment, please call your pharmacy*  Lab Work: None ordered  Testing/Procedures: None ordered  Follow-Up: At [facility], you and your health needs are our priority.  As part of our continuing mission to provide you with exceptional heart care, our providers are all part of one team.  This team includes your primary Cardiologist (physician) and Advanced Practice Providers or APPs (Physician Assistants and Nurse Practitioners) who all work together to provide you with the care you need, when you need it.  Your next appointment:  6 months    Call in Feb to schedule May appointment     Provider:  Dr.Jordan   We recommend signing up for the patient portal called MyChart.  Sign up information is provided on this After Visit Summary.  MyChart is used to connect with patients for Virtual Visits (Telemedicine).  Patients are able to view lab/test results, encounter notes, upcoming appointments, etc.  Non-urgent messages can be sent to your provider as well.   To learn more about what you can do with MyChart, go to [email or web address].

## 2024-10-03 ENCOUNTER — Ambulatory Visit (INDEPENDENT_AMBULATORY_CARE_PROVIDER_SITE_OTHER)

## 2024-10-03 VITALS — BP 128/82 | HR 62 | Temp 97.6°F | Ht 69.0 in | Wt 215.0 lb

## 2024-10-03 DIAGNOSIS — E7800 Pure hypercholesterolemia, unspecified: Secondary | ICD-10-CM | POA: Diagnosis not present

## 2024-10-03 DIAGNOSIS — I158 Other secondary hypertension: Secondary | ICD-10-CM | POA: Diagnosis not present

## 2024-10-03 DIAGNOSIS — I4811 Longstanding persistent atrial fibrillation: Secondary | ICD-10-CM

## 2024-10-03 DIAGNOSIS — E66811 Obesity, class 1: Secondary | ICD-10-CM | POA: Diagnosis not present

## 2024-10-03 DIAGNOSIS — E6609 Other obesity due to excess calories: Secondary | ICD-10-CM

## 2024-10-03 DIAGNOSIS — Z6831 Body mass index (BMI) 31.0-31.9, adult: Secondary | ICD-10-CM | POA: Diagnosis not present

## 2024-10-03 DIAGNOSIS — Z125 Encounter for screening for malignant neoplasm of prostate: Secondary | ICD-10-CM | POA: Diagnosis not present

## 2024-10-03 LAB — COMPREHENSIVE METABOLIC PANEL WITH GFR
ALT: 32 IU/L (ref 0–44)
AST: 32 IU/L (ref 0–40)
Albumin: 4.6 g/dL (ref 3.8–4.8)
Alkaline Phosphatase: 66 IU/L (ref 47–123)
BUN/Creatinine Ratio: 30 — ABNORMAL HIGH (ref 10–24)
BUN: 24 mg/dL (ref 8–27)
Bilirubin Total: 0.8 mg/dL (ref 0.0–1.2)
CO2: 25 mmol/L (ref 20–29)
Calcium: 9.9 mg/dL (ref 8.6–10.2)
Chloride: 103 mmol/L (ref 96–106)
Creatinine, Ser: 0.79 mg/dL (ref 0.76–1.27)
Globulin, Total: 2.1 g/dL (ref 1.5–4.5)
Glucose: 96 mg/dL (ref 70–99)
Potassium: 4.9 mmol/L (ref 3.5–5.2)
Sodium: 140 mmol/L (ref 134–144)
Total Protein: 6.7 g/dL (ref 6.0–8.5)
eGFR: 91 mL/min/1.73

## 2024-10-03 LAB — LIPID PANEL
Chol/HDL Ratio: 3.2 ratio (ref 0.0–5.0)
Cholesterol, Total: 163 mg/dL (ref 100–199)
HDL: 51 mg/dL
LDL Chol Calc (NIH): 95 mg/dL (ref 0–99)
Triglycerides: 89 mg/dL (ref 0–149)
VLDL Cholesterol Cal: 17 mg/dL (ref 5–40)

## 2024-10-03 LAB — PSA: Prostate Specific Ag, Serum: 1.3 ng/mL (ref 0.0–4.0)

## 2024-10-03 MED ORDER — CLOTRIMAZOLE-BETAMETHASONE 1-0.05 % EX CREA: TOPICAL_CREAM | CUTANEOUS | 2 refills | Status: AC

## 2024-10-03 NOTE — Assessment & Plan Note (Signed)
 Orders:    PSA

## 2024-10-03 NOTE — Assessment & Plan Note (Signed)
" °  Class 1 obesity with major comorbidity of HTN, hyperlipidemia and AFIB.  Weight has decreased by three pounds since last visit. Encouraged to continue healthy eating and physical activity. - Encouraged healthy eating and physical activity    "

## 2024-10-03 NOTE — Progress Notes (Signed)
 "  Subjective:  Patient ID: Ryan Mccall, male    DOB: [birth year]  Age: 79 y.o. MRN: [id number]  Chief Complaint  Patient presents with   Medical Management of Chronic Issues    HPI: Discussed the use of AI scribe software for clinical note transcription with the patient, who gave verbal consent to proceed.  History of Present Illness   Ryan Mccall is a 79 year old male with atrial fibrillation and hyperlipidemia who presents for a follow-up visit.  Compliant with medications  Tries to eat healthy  Orthostatic dizziness - Experiences 'swimmy headed' sensations when standing up quickly  Nasal discharge - Occasionally notices pink discharge when blowing his nose  Musculoskeletal pain - Received steroid injection in left shoulder in June with improvement in pain - Continues to have occasional left shoulder and neck pain, attributed to age  History of fall - [place] three months ago while putting on pants - Sustained a minor injury  Peripheral edema and skin care - Wears compression socks during the week for occasional leg swelling - Applies lotion for skin dryness            10/03/2024    8:38 AM 03/22/2024    8:07 AM 01/27/2024    9:29 AM 12/17/2023    8:18 AM 09/14/2023    8:45 AM  Depression screen PHQ 2/9  Decreased Interest 0 0 0 0 0  Down, Depressed, Hopeless 0 0 0 0 0  PHQ - 2 Score 0 0 0 0 0  Altered sleeping  0   0  Tired, decreased energy  1   0  Change in appetite  0   0  Feeling bad or failure about yourself   0   0  Trouble concentrating  0   0  Moving slowly or fidgety/restless  0   0  Suicidal thoughts  0   0  PHQ-9 Score  1    0   Difficult doing work/chores  Not difficult at all   Not difficult at all     Data saved with a previous flowsheet row definition        10/03/2024    8:38 AM  Fall Risk   Falls in the past year? 0  Number falls in past yr: 0  Injury with Fall? 0  Risk for fall due to : No Fall Risks  Follow up Falls evaluation  completed    Patient Care Team: Aryeh Butterfield, MD as PCP - General (Family Medicine) Jordan, [name] M, MD as PCP - Cardiology (Cardiology) Mittie Gaskin, MD as Referring Physician (Ophthalmology)   Review of Systems  Constitutional:  Negative for appetite change, fatigue and fever.  HENT:  Negative for congestion, ear pain, sinus pressure and sore throat.   Respiratory:  Negative for cough, chest tightness, shortness of breath and wheezing.   Cardiovascular:  Negative for chest pain and palpitations.  Gastrointestinal:  Negative for abdominal pain, constipation, diarrhea, nausea and vomiting.  Genitourinary:  Negative for dysuria and hematuria.  Musculoskeletal:  Negative for arthralgias, back pain, joint swelling and myalgias.  Skin:  Negative for rash.  Neurological:  Negative for dizziness, weakness and headaches.  Psychiatric/Behavioral:  Negative for dysphoric mood. The patient is not nervous/anxious.     Medications Ordered Prior to Encounter[1] Past Medical History:  Diagnosis Date   Atrial fibrillation [facility])    Coronary artery disease    s/p remote CABG 1999 per Dr. 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   Wears dentures    full upper and lower   Past Surgical History:  Procedure Laterality Date   CATARACT EXTRACTION W/PHACO Right 01/19/2020   Procedure: CATARACT EXTRACTION PHACO AND INTRAOCULAR LENS PLACEMENT (IOC) RIGHT VISION BLUE 8.60  00:58.6;  Surgeon: Ferol Rogue, MD;  Location: [facility];  Service: Ophthalmology;  Laterality: Right;   CATARACT EXTRACTION W/PHACO Left 01/09/2021   Procedure:  CATARACT EXTRACTION PHACO AND INTRAOCULAR LENS PLACEMENT (IOC) LEFT;  Surgeon: Mittie Gaskin, MD;  Location: [facility];  Service: Ophthalmology;  Laterality: Left;  3.30 0:51.6 6.4%   COLECTOMY WITH COLOSTOMY CREATION/HARTMANN PROCEDURE N/A 07/03/2017   Procedure: COLECTOMY WITH COLOSTOMY CREATION/HARTMANN PROCEDURE;  Surgeon: Wonda Charlie BRAVO, MD;  Location: [facility];  Service: General;  Laterality: N/A;   COLONOSCOPY WITH PROPOFOL  N/A 10/27/2017   Procedure: COLONOSCOPY WITH PROPOFOL  through colostomy;  Surgeon: Jinny Carmine, MD;  Location: [facility];  Service: Endoscopy;  Laterality: N/A;   COLOSTOMY CLOSURE N/A 11/17/2017   Procedure: COLOSTOMY CLOSURE;  Surgeon: Wonda Charlie BRAVO, MD;  Location: [facility];  Service: General;  Laterality: N/A;   CORONARY ARTERY BYPASS GRAFT  1999   4 vessels   excision of skin cancer     KNEE ARTHROSCOPY W/ MENISCAL REPAIR      Family History  Problem Relation Age of Onset   Lung disease Father 3   Social History   Socioeconomic History   Marital status: Married    Spouse name: Not on file   Number of children: 2   Years of education: Not on file   Highest education level: Not on file  Occupational History   Occupation: therapist, art  Tobacco Use   Smoking status: Former    Types: Cigars    Quit date: 09/29/1997    Years since quitting: 27.0   Smokeless tobacco: Former    Types: Chew    Quit date: 1999  Vaping Use   Vaping status: Never Used  Substance and Sexual Activity   Alcohol use: Yes    Alcohol/week: 1.0 standard drink of alcohol    Types: 1 Shots of liquor per week    Comment: one time monthly   Drug use: No   Sexual activity: Yes    Partners: Female  Other Topics Concern   Not on file  Social History Narrative   Not on file   Social Drivers of Health   Tobacco Use: Medium Risk (10/03/2024)   Patient History    Smoking Tobacco Use: Former    Smokeless Tobacco Use: Former    Passive  Exposure: Not on Actuary Strain: Medium Risk (09/14/2023)   Overall Financial Resource Strain (CARDIA)    Difficulty of Paying Living Expenses: Somewhat hard  Food Insecurity: No Food Insecurity (09/14/2023)   Hunger Vital Sign    Worried About Running Out of Food in the Last Year: Never true    Ran Out of Food in the Last Year: Never true  Transportation Needs: No Transportation Needs (09/14/2023)   PRAPARE - Administrator, Civil Service (Medical): No    Lack of Transportation (  Non-Medical): No  Physical Activity: Sufficiently Active (09/14/2023)   Exercise Vital Sign    Days of Exercise per Week: 5 days    Minutes of Exercise per Session: 30 min  Stress: No Stress Concern Present (09/14/2023)   [employer] of Occupational Health - Occupational Stress Questionnaire    Feeling of Stress : Not at all  Social Connections: Socially Integrated (09/14/2023)   Social Connection and Isolation Panel    Frequency of Communication with Friends and Family: More than three times a week    Frequency of Social Gatherings with Friends and Family: More than three times a week    Attends Religious Services: More than 4 times per year    Active Member of Clubs or Organizations: Yes    Attends Banker Meetings: More than 4 times per year    Marital Status: Married  Depression (PHQ2-9): Low Risk (10/03/2024)   Depression (PHQ2-9)    PHQ-2 Score: 0  Alcohol Screen: Low Risk (09/14/2023)   Alcohol Screen    Last Alcohol Screening Score (AUDIT): 1  Housing: Unknown (09/14/2023)   Housing Stability Vital Sign    Unable to Pay for Housing in the Last Year: No    Number of Times Moved in the Last Year: Not on file    Homeless in the Last Year: No  Utilities: Not At Risk (09/14/2023)   AHC Utilities    Threatened with loss of utilities: No  Health Literacy: Adequate Health Literacy (09/14/2023)   B1300 Health Literacy    Frequency of need for help with  medical instructions: Never    Objective:  BP 128/82 (BP Location: Left Arm, Patient Position: Sitting)   Pulse 62   Temp 97.6 F (36.4 C) (Temporal)   Ht 5' 9 (1.753 m)   Wt 215 lb (97.5 kg)   SpO2 98%   BMI 31.75 kg/m      10/03/2024    8:36 AM 08/08/2024    7:50 AM 03/22/2024    8:03 AM  BP/Weight  Systolic BP 128 126 128  Diastolic BP 82 76 86  Wt. (Lbs) 215 216 218.6  BMI 31.75 kg/m2 31.9 kg/m2 32.28 kg/m2    Physical Exam Vitals and nursing note reviewed.  Constitutional:      Appearance: He is obese.  HENT:     Head: Normocephalic and atraumatic.     Mouth/Throat:     Mouth: Mucous membranes are moist.     Comments: Dentures noted Eyes:     Pupils: Pupils are equal, round, and reactive to Bosko.  Cardiovascular:     Rate and Rhythm: Normal rate and regular rhythm.  Pulmonary:     Effort: Pulmonary effort is normal.     Breath sounds: Normal breath sounds.  Skin:    General: Skin is dry.     Comments: Venous stasis changes noted in both legs  Neurological:     General: No focal deficit present.     Mental Status: He is alert and oriented to person, place, and time.  Psychiatric:        Mood and Affect: Mood normal.        Behavior: Behavior normal.         Lab Results  Component Value Date   WBC 4.8 08/05/2023   HGB 14.9 08/05/2023   HCT 45.5 08/05/2023   PLT 164 08/05/2023   GLUCOSE 102 (H) 03/22/2024   CHOL 122 03/22/2024   TRIG 58 03/22/2024   HDL 46  03/22/2024   LDLCALC 63 03/22/2024   ALT 30 03/22/2024   AST 37 03/22/2024   NA 142 03/22/2024   K 4.6 03/22/2024   CL 104 03/22/2024   CREATININE 0.88 03/22/2024   BUN 27 03/22/2024   CO2 20 03/22/2024   TSH 2.920 12/17/2022   INR 1.2 12/17/2022   HGBA1C 5.3 06/18/2023    Results for orders placed or performed in visit on 03/22/24  Comprehensive metabolic panel with GFR   Collection Time: 03/22/24  8:42 AM  Result Value Ref Range   Glucose 102 (H) 70 - 99 mg/dL   BUN 27 8 - 27  mg/dL   Creatinine, Ser 9.11 0.76 - 1.27 mg/dL   eGFR 89 >40 fO/fpw/8.26   BUN/Creatinine Ratio 31 (H) 10 - 24   Sodium 142 134 - 144 mmol/L   Potassium 4.6 3.5 - 5.2 mmol/L   Chloride 104 96 - 106 mmol/L   CO2 20 20 - 29 mmol/L   Calcium 9.5 8.6 - 10.2 mg/dL   Total Protein 6.8 6.0 - 8.5 g/dL   Albumin 4.6 3.8 - 4.8 g/dL   Globulin, Total 2.2 1.5 - 4.5 g/dL   Bilirubin Total 0.9 0.0 - 1.2 mg/dL   Alkaline Phosphatase 62 44 - 121 IU/L   AST 37 0 - 40 IU/L   ALT 30 0 - 44 IU/L  Lipid Panel   Collection Time: 03/22/24  8:42 AM  Result Value Ref Range   Cholesterol, Total 122 100 - 199 mg/dL   Triglycerides 58 0 - 149 mg/dL   HDL 46 >60 mg/dL   VLDL Cholesterol Cal 13 5 - 40 mg/dL   LDL Chol Calc (NIH) 63 0 - 99 mg/dL   Chol/HDL Ratio 2.7 0.0 - 5.0 ratio  .  Assessment & Plan:   Assessment & Plan Longstanding persistent atrial fibrillation (HCC) Managed with Xarelto  20 mg daily and Coreg  6.25 mg twice daily. Heart rate is regular today. - Continue Xarelto  20 mg daily - Continue Coreg  6.25 mg twice daily Orders:   Comprehensive metabolic panel with GFR  Hypercholesterolemia Managed with simvastatin  10 mg and Zetia  10 mg. Previous blood work showed excellent cholesterol levels. - Continue simvastatin  10 mg daily - Continue Zetia  10 mg daily - Ordered blood work to monitor cholesterol levels Orders:   Lipid panel  Class 1 obesity due to excess calories with serious comorbidity and body mass index (BMI) of 31.0 to 31.9 in adult  Class 1 obesity with major comorbidity of HTN, hyperlipidemia and AFIB.  Weight has decreased by three pounds since last visit. Encouraged to continue healthy eating and physical activity. - Encouraged healthy eating and physical activity    Other secondary hypertension BP well controlled with Coreg  6.25 mg TWICE DAILY, Spironolactone  25 mg daily.  Orders:   Comprehensive metabolic panel with GFR  Encounter for prostate cancer  screening  Orders:   PSA  General Health Maintenance Shingles vaccine received 2-3 years ago. Declined flu shot and COVID vaccine. Due for Medicare wellness visit. - Will schedule Medicare wellness visit - Encouraged flu vaccination - Encouraged COVID vaccination        Body mass index is 31.75 kg/m.   No orders of the defined types were placed in this encounter.   No orders of the defined types were placed in this encounter.      Follow-up: No follow-ups on file.  An After Visit Summary was printed and given to the patient.  Stephano Arrants, MD  [facility] [phone]     [1]  Current Outpatient Medications on File Prior to Visit  Medication Sig Dispense Refill   carvedilol  (COREG ) 6.25 MG tablet Take 1 tablet (6.25 mg total) by mouth 2 (two) times daily. 180 tablet 3   Cholecalciferol (VITAMIN D) 50 MCG (2000 UT) CAPS Take 1 capsule by mouth daily.     clotrimazole -betamethasone  (LOTRISONE ) cream Apply 2 gr in the affected area twice a day. 45 g 2   COD LIVER OIL PO Take 1 capsule by mouth daily.     diclofenac  Sodium (VOLTAREN ) 1 % GEL Apply 2 g topically 2 (two) times daily as needed. 1000 g 2   ezetimibe  (ZETIA ) 10 MG tablet Take 1 tablet (10 mg total) by mouth every other day. 90 tablet 3   feeding supplement, ENSURE ENLIVE, (ENSURE ENLIVE) LIQD Take 237 mLs by mouth 2 (two) times daily between meals. 237 mL 12   fexofenadine (ALLEGRA) 180 MG tablet Take 180 mg by mouth daily as needed for allergies.     nitroGLYCERIN  (NITROSTAT ) 0.4 MG SL tablet Place 1 tablet (0.4 mg total) under the tongue every 5 (five) minutes as needed for chest pain. 25 tablet 6   rivaroxaban  (XARELTO ) 20 MG TABS tablet Take 1 tablet (20 mg total) by mouth daily with supper. 90 tablet 3   silver  sulfADIAZINE  (SILVADENE ) 1 % cream Apply 1 application topically daily. 50 g 2   simvastatin  (ZOCOR ) 10 MG tablet Take 1 tablet (10 mg total) by mouth every other day. 90 tablet 3    spironolactone  (ALDACTONE ) 25 MG tablet Take 1 tablet (25 mg total) by mouth daily. 90 tablet 3   vitamin C (ASCORBIC ACID) 500 MG tablet Take 500 mg by mouth 2 (two) times a week.     No current facility-administered medications on file prior to visit.   "

## 2024-10-03 NOTE — Assessment & Plan Note (Signed)
 BP well controlled with Coreg  6.25 mg TWICE DAILY, Spironolactone  25 mg daily.  Orders:   Comprehensive metabolic panel with GFR

## 2024-10-03 NOTE — Assessment & Plan Note (Signed)
 Managed with Xarelto  20 mg daily and Coreg  6.25 mg twice daily. Heart rate is regular today. - Continue Xarelto  20 mg daily - Continue Coreg  6.25 mg twice daily Orders:   Comprehensive metabolic panel with GFR

## 2024-10-03 NOTE — Assessment & Plan Note (Signed)
 Managed with simvastatin  10 mg and Zetia  10 mg. Previous blood work showed excellent cholesterol levels. - Continue simvastatin  10 mg daily - Continue Zetia  10 mg daily - Ordered blood work to monitor cholesterol levels Orders:   Lipid panel

## 2024-10-05 ENCOUNTER — Ambulatory Visit: Payer: Self-pay

## 2025-04-03 ENCOUNTER — Ambulatory Visit
